# Patient Record
Sex: Female | Born: 1970 | Race: Asian | Hispanic: No | Marital: Married | Smoking: Never smoker
Health system: Southern US, Community
[De-identification: ages and names within clinical notes are randomized; demographics above are authoritative.]

## PROBLEM LIST (undated history)

## (undated) ENCOUNTER — Emergency Department (HOSPITAL_COMMUNITY): Admission: EM | Source: Home / Self Care

## (undated) DIAGNOSIS — R7303 Prediabetes: Secondary | ICD-10-CM

## (undated) DIAGNOSIS — D249 Benign neoplasm of unspecified breast: Secondary | ICD-10-CM

## (undated) DIAGNOSIS — F419 Anxiety disorder, unspecified: Secondary | ICD-10-CM

## (undated) DIAGNOSIS — K219 Gastro-esophageal reflux disease without esophagitis: Secondary | ICD-10-CM

## (undated) DIAGNOSIS — K8020 Calculus of gallbladder without cholecystitis without obstruction: Secondary | ICD-10-CM

## (undated) DIAGNOSIS — M1990 Unspecified osteoarthritis, unspecified site: Secondary | ICD-10-CM

## (undated) DIAGNOSIS — T7840XA Allergy, unspecified, initial encounter: Secondary | ICD-10-CM

## (undated) HISTORY — PX: COLONOSCOPY: SHX174

## (undated) HISTORY — DX: Unspecified osteoarthritis, unspecified site: M19.90

## (undated) HISTORY — DX: Gastro-esophageal reflux disease without esophagitis: K21.9

## (undated) HISTORY — DX: Anxiety disorder, unspecified: F41.9

## (undated) HISTORY — DX: Prediabetes: R73.03

## (undated) HISTORY — DX: Benign neoplasm of unspecified breast: D24.9

## (undated) HISTORY — DX: Allergy, unspecified, initial encounter: T78.40XA

---

## 2012-10-09 ENCOUNTER — Ambulatory Visit (INDEPENDENT_AMBULATORY_CARE_PROVIDER_SITE_OTHER): Payer: BC Managed Care – PPO | Admitting: Family Medicine

## 2012-10-09 VITALS — BP 118/68 | HR 71 | Temp 98.1°F | Resp 16 | Ht 62.25 in | Wt 125.0 lb

## 2012-10-09 DIAGNOSIS — N6311 Unspecified lump in the right breast, upper outer quadrant: Secondary | ICD-10-CM

## 2012-10-09 DIAGNOSIS — N63 Unspecified lump in unspecified breast: Secondary | ICD-10-CM

## 2012-10-09 DIAGNOSIS — Z0000 Encounter for general adult medical examination without abnormal findings: Secondary | ICD-10-CM

## 2012-10-09 NOTE — Progress Notes (Signed)
Subjective:    Patient ID: Cristina Boyd, female    DOB: 19-Mar-1970, 42 y.o.   MRN: [id number]  HPI Nature Cristina Boyd is a 42 y.o. female New pt- here for CPE and concern as below. Here with dtr- translator.  No current primary care provider.  Last pap WNL 10/10. Tetanus: 8 years ago Dentist: none - plans to schedule.  Optho: none - plans to schedule.  Cup of milk only this am.  Had bloodwork done at outside office - Dr. Mayford Boyd on [address] - ? Check up? Cholesterol and other tests.  Lump/sore area in R breast above prior sore lump -past 2-3 years.Soreness in area around time of period - about 4 days. No prior eval. Did have lump in R lower/outside breast about 4 years ago - had MMG, ultrasound, and biopsy - told was ok.  Recommended MMG at 43 yo. No unexplained wt loss, no night sweats, no nipple discharge. LMP July 25th - normal.    History reviewed. No pertinent past medical history. History reviewed. No pertinent past surgical history. Only surgery R breast biopsy - 4 years ago.   No Known Allergies Prior to Admission medications   Not on File   History   Social History  . Marital Status: Married    Spouse Name: N/A    Number of Children: N/A  . Years of Education: N/A   Occupational History  . Not on file.   Social History Main Topics  . Smoking status: Never Smoker   . Smokeless tobacco: Not on file  . Alcohol Use: No  . Drug Use: No  . Sexually Active: Yes   Other Topics Concern  . Not on file   Social History Narrative  . No narrative on file    FH: mom with HTN. No cancer by hx.    Review of Systems 13 point review of systems per patient health survey noted.  Negative other than as indicated on HPI.     Objective:   Physical Exam  Vitals reviewed. Constitutional: She is oriented to person, place, and time. She appears well-developed and well-nourished.  HENT:  Head: Normocephalic and atraumatic.  Right Ear: External ear normal.  Left Ear: External  ear normal.  Mouth/Throat: Oropharynx is clear and moist.  Eyes: Conjunctivae are normal. Pupils are equal, round, and reactive to light.  Neck: Normal range of motion. Neck supple. No thyromegaly present.  Cardiovascular: Normal rate, regular rhythm, normal heart sounds and intact distal pulses.   No murmur heard. Pulmonary/Chest: Effort normal and breath sounds normal. No respiratory distress. She has no wheezes. Right breast exhibits mass (small, tender, mobile thickened area of tissue approx 3-4cm above nipple. no nipple d/c, no retraction.  no axillary or supraclavicular LAD. ) and tenderness. Right breast exhibits no inverted nipple, no nipple discharge and no skin change. Left breast exhibits no inverted nipple, no mass, no nipple discharge, no skin change and no tenderness. Breasts are symmetrical.    Abdominal: Soft. Bowel sounds are normal. There is no tenderness.  Genitourinary: Pelvic exam was performed with patient supine. There is no lesion on the right labia. There is no lesion on the left labia. There is bleeding (minmal at os after pap testing.  strings of IUD visualized. ) around the vagina. No vaginal discharge found.  Musculoskeletal: Normal range of motion. She exhibits no edema and no tenderness.  Lymphadenopathy:    She has no cervical adenopathy.       Right: No  inguinal adenopathy present.       Left: No inguinal adenopathy present.  Neurological: She is alert and oriented to person, place, and time.  Skin: Skin is warm and dry. No rash noted.  Psychiatric: She has a normal mood and affect. Her behavior is normal. Thought content normal.   Filed Vitals:   10/09/12 0834  BP: 118/68  Pulse: 71  Temp: 98.1 F (36.7 C)  Resp: 16   Vision reviewed - WNL.        Assessment & Plan:  [name] is a 42 y.o. female Annual physical exam - Plan: Pap IG w/ reflex to HPV when ASC-U  Breast lump on right side at 11 o'clock position - Plan: MM Digital Diagnostic  Bilat   CPE/annual exam- anticipatory guidance given. Will obtain bloodwork form outside office, then determine if repeat or other testing needed.  Refer for MMG - diagnostic and ultrasound if needed.  Pap 2.  Declined STI testing.  Anticipatory guidance.   R breast lump - prior negative workup.  Will recheck diagnostic mammogram +/- ultrasound.   Patient Instructions  We will schedule mammogram.  You should receive a call or letter about your lab results within the next week to 10 days.  We requested bloodwork from outside office and can determine if other testing needed after that is received.  Return to the clinic or go to the nearest emergency room if any of your symptoms worsen or new symptoms occur. Keeping You Healthy  Get These Tests 1. Blood Pressure- Have your blood pressure checked once a year by your health care provider.  Normal blood pressure is 120/80. 2. Weight- Have your body mass index (BMI) calculated to screen for obesity.  BMI is measure of body fat based on height and weight.  You can also calculate your own BMI at https://www.west-esparza.com/. 3. Cholesterol- Have your cholesterol checked every 5 years starting at age 38 then yearly starting at age 66. 4. Chlamydia, HIV, and other sexually transmitted diseases- Get screened every year until age 60, then within three months of each new sexual provider. 5. Pap Smear- Every 1-3 years; discuss with your health care provider. 6. Mammogram- Every year starting at age 87  Take these medicines  Calcium with Vitamin D-Your body needs 1200 mg of Calcium each day and [phone] IU of Vitamin D daily.  Your body can only absorb 500 mg of Calcium at a time so Calcium must be taken in 2 or 3 divided doses throughout the day.  Multivitamin with folic acid- Once daily if it is possible for you to become pregnant.  Get these Immunizations  Gardasil-Series of three doses; prevents HPV related illness such as genital warts and  cervical cancer.  Menactra-Single dose; prevents meningitis.  Tetanus shot- Every 10 years.  Flu shot-Every year.  Take these steps 1. Do not smoke-Your healthcare provider can help you quit.  For tips on how to quit go to www.smokefree.gov or call 1-800 QUITNOW. 2. Be physically active- Exercise 5 days a week for at least 30 minutes.  If you are not already physically active, start slow and gradually work up to 30 minutes of moderate physical activity.  Examples of moderate activity include walking briskly, dancing, swimming, bicycling, etc. 3. Breast Cancer- A self breast exam every month is important for early detection of breast cancer.  For more information and instruction on self breast exams, ask your healthcare provider or [email or web address]. 4. Eat a healthy diet- Eat a variety  of healthy foods such as fruits, vegetables, whole grains, low fat milk, low fat cheeses, yogurt, lean meats, poultry and fish, beans, nuts, tofu, etc.  For more information go to www. Thenutritionsource.org 5. Drink alcohol in moderation- Limit alcohol intake to one drink or less per day. Never drink and drive. 6. Depression- Your emotional health is as important as your physical health.  If you're feeling down or losing interest in things you normally enjoy please talk to your healthcare provider about being screened for depression. 7. Dental visit- Brush and floss your teeth twice daily; visit your dentist twice a year. 8. Eye doctor- Get an eye exam at least every 2 years. 9. Helmet use- Always wear a helmet when riding a bicycle, motorcycle, rollerblading or skateboarding. 10. Safe sex- If you may be exposed to sexually transmitted infections, use a condom. 11. Seat belts- Seat belts can save your live; always wear one. 12. Smoke/Carbon Monoxide detectors- These detectors need to be installed on the appropriate level of your home. Replace batteries at least once a year. 13. Skin  cancer- When out in the sun please cover up and use sunscreen 15 SPF or higher. 14. Violence- If anyone is threatening or hurting you, please tell your healthcare provider.

## 2012-10-09 NOTE — Patient Instructions (Signed)
We will schedule mammogram.  You should receive a call or letter about your lab results within the next week to 10 days.  We requested bloodwork from outside office and can determine if other testing needed after that is received.  Return to the clinic or go to the nearest emergency room if any of your symptoms worsen or new symptoms occur. Keeping You Healthy  Get These Tests 1. Blood Pressure- Have your blood pressure checked once a year by your health care provider.  Normal blood pressure is 120/80. 2. Weight- Have your body mass index (BMI) calculated to screen for obesity.  BMI is measure of body fat based on height and weight.  You can also calculate your own BMI at https://www.west-esparza.com/. 3. Cholesterol- Have your cholesterol checked every 5 years starting at age 47 then yearly starting at age 63. 4. Chlamydia, HIV, and other sexually transmitted diseases- Get screened every year until age 67, then within three months of each new sexual provider. 5. Pap Smear- Every 1-3 years; discuss with your health care provider. 6. Mammogram- Every year starting at age 53  Take these medicines  Calcium with Vitamin D-Your body needs 1200 mg of Calcium each day and [phone] IU of Vitamin D daily.  Your body can only absorb 500 mg of Calcium at a time so Calcium must be taken in 2 or 3 divided doses throughout the day.  Multivitamin with folic acid- Once daily if it is possible for you to become pregnant.  Get these Immunizations  Gardasil-Series of three doses; prevents HPV related illness such as genital warts and cervical cancer.  Menactra-Single dose; prevents meningitis.  Tetanus shot- Every 10 years.  Flu shot-Every year.  Take these steps 1. Do not smoke-Your healthcare provider can help you quit.  For tips on how to quit go to www.smokefree.gov or call 1-800 QUITNOW. 2. Be physically active- Exercise 5 days a week for at least 30 minutes.  If you are not already physically active,  start slow and gradually work up to 30 minutes of moderate physical activity.  Examples of moderate activity include walking briskly, dancing, swimming, bicycling, etc. 3. Breast Cancer- A self breast exam every month is important for early detection of breast cancer.  For more information and instruction on self breast exams, ask your healthcare provider or [email or web address]. 4. Eat a healthy diet- Eat a variety of healthy foods such as fruits, vegetables, whole grains, low fat milk, low fat cheeses, yogurt, lean meats, poultry and fish, beans, nuts, tofu, etc.  For more information go to www. Thenutritionsource.org 5. Drink alcohol in moderation- Limit alcohol intake to one drink or less per day. Never drink and drive. 6. Depression- Your emotional health is as important as your physical health.  If you're feeling down or losing interest in things you normally enjoy please talk to your healthcare provider about being screened for depression. 7. Dental visit- Brush and floss your teeth twice daily; visit your dentist twice a year. 8. Eye doctor- Get an eye exam at least every 2 years. 9. Helmet use- Always wear a helmet when riding a bicycle, motorcycle, rollerblading or skateboarding. 10. Safe sex- If you may be exposed to sexually transmitted infections, use a condom. 11. Seat belts- Seat belts can save your live; always wear one. 12. Smoke/Carbon Monoxide detectors- These detectors need to be installed on the appropriate level of your home. Replace batteries at least once a year. 13. Skin cancer- When out in the sun please  cover up and use sunscreen 15 SPF or higher. 14. Violence- If anyone is threatening or hurting you, please tell your healthcare provider.

## 2012-10-10 ENCOUNTER — Other Ambulatory Visit: Payer: Self-pay | Admitting: Family Medicine

## 2012-10-10 DIAGNOSIS — N6311 Unspecified lump in the right breast, upper outer quadrant: Secondary | ICD-10-CM

## 2012-10-10 LAB — PAP IG W/ RFLX HPV ASCU

## 2012-11-12 ENCOUNTER — Ambulatory Visit (INDEPENDENT_AMBULATORY_CARE_PROVIDER_SITE_OTHER): Payer: BC Managed Care – PPO | Admitting: Family Medicine

## 2012-11-12 VITALS — BP 98/62 | HR 71 | Temp 98.0°F | Resp 18 | Ht 62.0 in | Wt 126.0 lb

## 2012-11-12 DIAGNOSIS — IMO0001 Reserved for inherently not codable concepts without codable children: Secondary | ICD-10-CM

## 2012-11-12 DIAGNOSIS — R5381 Other malaise: Secondary | ICD-10-CM

## 2012-11-12 DIAGNOSIS — R5383 Other fatigue: Secondary | ICD-10-CM

## 2012-11-12 DIAGNOSIS — D249 Benign neoplasm of unspecified breast: Secondary | ICD-10-CM

## 2012-11-12 DIAGNOSIS — M791 Myalgia, unspecified site: Secondary | ICD-10-CM

## 2012-11-12 DIAGNOSIS — D179 Benign lipomatous neoplasm, unspecified: Secondary | ICD-10-CM

## 2012-11-12 LAB — COMPREHENSIVE METABOLIC PANEL WITH GFR
ALT: 20 U/L (ref 0–35)
AST: 18 U/L (ref 0–37)
Albumin: 4.2 g/dL (ref 3.5–5.2)
Alkaline Phosphatase: 48 U/L (ref 39–117)
Glucose, Bld: 118 mg/dL — ABNORMAL HIGH (ref 70–99)
Potassium: 3.8 meq/L (ref 3.5–5.3)
Sodium: 137 meq/L (ref 135–145)
Total Protein: 7.5 g/dL (ref 6.0–8.3)

## 2012-11-12 LAB — POCT CBC
Granulocyte percent: 65.2 % (ref 37–80)
HCT, POC: 39.9 % (ref 37.7–47.9)
Hemoglobin: 12.2 g/dL (ref 12.2–16.2)
Lymph, poc: 1.5 (ref 0.6–3.4)
MCH, POC: 27.5 pg (ref 27–31.2)
MCHC: 30.6 g/dL — AB (ref 31.8–35.4)
MCV: 90.1 fL (ref 80–97)
MID (cbc): 0.3 (ref 0–0.9)
MPV: 9 fL (ref 0–99.8)
POC Granulocyte: 3.4 (ref 2–6.9)
POC LYMPH PERCENT: 29.1 %L (ref 10–50)
POC MID %: 5.7 % (ref 0–12)
Platelet Count, POC: 298 10*3/uL (ref 142–424)
RBC: 4.43 M/uL (ref 4.04–5.48)
RDW, POC: 13.5 %
WBC: 5.2 10*3/uL (ref 4.6–10.2)

## 2012-11-12 LAB — POCT INFLUENZA A/B
Influenza A, POC: NEGATIVE
Influenza B, POC: NEGATIVE

## 2012-11-12 LAB — COMPREHENSIVE METABOLIC PANEL
BUN: 8 mg/dL (ref 6–23)
CO2: 25 mEq/L (ref 19–32)
Calcium: 9.5 mg/dL (ref 8.4–10.5)
Chloride: 104 mEq/L (ref 96–112)
Creat: 0.54 mg/dL (ref 0.50–1.10)
Total Bilirubin: 0.4 mg/dL (ref 0.3–1.2)

## 2012-11-12 LAB — TSH: TSH: 1.672 u[IU]/mL (ref 0.350–4.500)

## 2012-11-12 NOTE — Patient Instructions (Signed)
Fibroadenoma  A fibroadenoma is a small, round, rubbery lump (tumor). The lump is not cancer. It often does not cause pain. It may move slightly when you touch it. This kind of lump can grow in one or both breasts.  HOME CARE  · Check your lump often for any changes.  · Keep all follow-up exams and mammograms.  GET HELP RIGHT AWAY IF:  · The lump changes in size.  · The lump becomes tender and painful.  · You have fluid coming from your nipple.  MAKE SURE YOU:  · Understand these instructions.  · Will watch your condition.  · Will get help right away if you are not doing well or get worse.  Document Released: 05/19/2008 Document Revised: 05/15/2011 Document Reviewed: 05/19/2008  ExitCare® Patient Information ©2014 [employer].

## 2012-11-12 NOTE — Progress Notes (Signed)
[facility]:  Office Visit  Chief Complaint:  Chief Complaint  Patient presents with  . discuss mammogram  . Flu Vaccine  . large places on both legs    x1 year    HPI: Cristina Boyd is a 42 y.o. female who complains of  1 day history of flu like sxs, fatigue, msk aches, took advil felt slightly better. She also has had fibroadenoma on mammogram, she would like to talk about the mammogram results and see how she can ake this better. Mammogram results reviewed with her. She also has had bumps on her bilateral thighs for the last 12 months and they have not changed in size or color. She denies pain.     History reviewed. No pertinent past medical history. History reviewed. No pertinent past surgical history. History   Social History  . Marital Status: Married    Spouse Name: N/A    Number of Children: N/A  . Years of Education: N/A   Social History Main Topics  . Smoking status: Never Smoker   . Smokeless tobacco: None  . Alcohol Use: No  . Drug Use: No  . Sexual Activity: Yes   Other Topics Concern  . None   Social History Narrative  . None   Family History  Problem Relation Age of Onset  . Hypertension Mother    No Known Allergies Prior to Admission medications   Medication Sig Start Date End Date Taking? Authorizing Provider  ibuprofen (ADVIL,MOTRIN) 200 MG tablet Take 200 mg by mouth every 6 (six) hours as needed for pain.   Yes Historical Provider, MD     ROS: The patient denies  night sweats, unintentional weight loss, chest pain, palpitations, wheezing, dyspnea on exertion, nausea, vomiting, abdominal pain, dysuria, hematuria, melena, numbness, , or tingling.   All other systems have been reviewed and were otherwise negative with the exception of those mentioned in the HPI and as above.    PHYSICAL EXAM: Filed Vitals:   11/12/12 0815  BP: 98/62  Pulse: 71  Temp: 98 F (36.7 C)  Resp: 18   Filed Vitals:   11/12/12 0815  Height: 5'  2" (1.575 m)  Weight: 126 lb (57.153 kg)   Body mass index is 23.04 kg/(m^2).  General: Alert, no acute distress HEENT:  Normocephalic, atraumatic, oropharynx patent. EOMI, PERRLA Cardiovascular:  Regular rate and rhythm, no rubs murmurs or gallops.  No Carotid bruits, radial pulse intact. No pedal edema.  Respiratory: Clear to auscultation bilaterally.  No wheezes, rales, or rhonchi.  No cyanosis, no use of accessory musculature GI: No organomegaly, abdomen is soft and non-tender, positive bowel sounds.  No masses. Skin: No rashes. + lipomas on bilateral thigh Neurologic: Facial musculature symmetric. Psychiatric: Patient is appropriate throughout our interaction. Lymphatic: No cervical lymphadenopathy Musculoskeletal: Gait intact. Breast-+ fibroadenoma, no dc, no nipple changes bilaterally No LAD, no thyroidmegaly   LABS: Results for orders placed in visit on 11/12/12  POCT CBC      Result Value Range   WBC 5.2  4.6 - 10.2 K/uL   Lymph, poc 1.5  0.6 - 3.4   POC LYMPH PERCENT 29.1  10 - 50 %L   MID (cbc) 0.3  0 - 0.9   POC MID % 5.7  0 - 12 %M   POC Granulocyte 3.4  2 - 6.9   Granulocyte percent 65.2  37 - 80 %G   RBC 4.43  4.04 - 5.48 M/uL   Hemoglobin 12.2  12.2 - 16.2 g/dL   HCT, POC 09.8  11.9 - 47.9 %   MCV 90.1  80 - 97 fL   MCH, POC 27.5  27 - 31.2 pg   MCHC 30.6 (*) 31.8 - 35.4 g/dL   RDW, POC 14.7     Platelet Count, POC 298  142 - 424 K/uL   MPV 9.0  0 - 99.8 fL  POCT INFLUENZA A/B      Result Value Range   Influenza A, POC Negative     Influenza B, POC Negative       EKG/XRAY:   Primary read interpreted by Dr. Conley Rolls at [facility].   ASSESSMENT/PLAN: Encounter Diagnoses  Name Primary?  . Fatigue Yes  . Muscle ache   . Lipoma   . Breast fibroadenoma, unspecified laterality    1. Monitor Lipoma 2. Return in October flu vaccine 3. Annual mammograms and SBE 4. Labs pending F/u prn Gross sideeffects, risk and benefits, and alternatives of medications d/w  patient. Patient is aware that all medications have potential sideeffects and we are unable to predict every sideeffect or drug-drug interaction that may occur.  LE, THAO PHUONG, DO 11/12/2012 9:27 AM

## 2012-11-14 ENCOUNTER — Encounter: Payer: Self-pay | Admitting: Family Medicine

## 2012-11-14 ENCOUNTER — Telehealth: Payer: Self-pay

## 2012-11-14 NOTE — Telephone Encounter (Signed)
Patient would like dr Conley Rolls to call regarding having chills and fever [phone]

## 2012-11-14 NOTE — Telephone Encounter (Signed)
Called patient. She states she is taking tylenol and feels better with the tylenol, indicates her neck still painful wants to know if there is anything else you recommend. She states she will come back in if needed, please advise. She is encouraged to use tylenol or advil for the fever.

## 2012-11-15 ENCOUNTER — Other Ambulatory Visit: Payer: Self-pay | Admitting: Family Medicine

## 2012-11-15 MED ORDER — CYCLOBENZAPRINE HCL 5 MG PO TABS: 5.0000 mg | ORAL_TABLET | Freq: Every evening | ORAL | Status: DC | PRN

## 2012-11-20 ENCOUNTER — Encounter: Payer: Self-pay | Admitting: Radiology

## 2012-11-20 DIAGNOSIS — R5381 Other malaise: Secondary | ICD-10-CM

## 2012-11-22 ENCOUNTER — Telehealth: Payer: Self-pay

## 2012-11-22 NOTE — Telephone Encounter (Signed)
Called to advise, left message.

## 2012-11-22 NOTE — Telephone Encounter (Signed)
Please advise,complains of continued fatigue.

## 2012-11-22 NOTE — Telephone Encounter (Signed)
Pt daughther is calling because her mother came in last month and had some blood work done and was told that she had high glucose levels. Now her mother is feeling tired and is hurting they are wondering if their is some type of medication that the mother could be put on to help with all of this  Call back number is [phone]

## 2012-11-26 ENCOUNTER — Ambulatory Visit (INDEPENDENT_AMBULATORY_CARE_PROVIDER_SITE_OTHER): Payer: BC Managed Care – PPO | Admitting: Family Medicine

## 2012-11-26 VITALS — BP 114/62 | HR 76 | Temp 98.1°F | Resp 18 | Ht 63.0 in | Wt 124.0 lb

## 2012-11-26 DIAGNOSIS — R202 Paresthesia of skin: Secondary | ICD-10-CM

## 2012-11-26 DIAGNOSIS — D551 Anemia due to other disorders of glutathione metabolism: Secondary | ICD-10-CM

## 2012-11-26 DIAGNOSIS — R631 Polydipsia: Secondary | ICD-10-CM

## 2012-11-26 DIAGNOSIS — Z13220 Encounter for screening for lipoid disorders: Secondary | ICD-10-CM

## 2012-11-26 DIAGNOSIS — R209 Unspecified disturbances of skin sensation: Secondary | ICD-10-CM

## 2012-11-26 DIAGNOSIS — N390 Urinary tract infection, site not specified: Secondary | ICD-10-CM

## 2012-11-26 DIAGNOSIS — R5381 Other malaise: Secondary | ICD-10-CM

## 2012-11-26 DIAGNOSIS — R229 Localized swelling, mass and lump, unspecified: Secondary | ICD-10-CM

## 2012-11-26 LAB — LIPID PANEL
Cholesterol: 208 mg/dL — ABNORMAL HIGH (ref 0–200)
HDL: 63 mg/dL (ref >39)
LDL Cholesterol: 132 mg/dL — ABNORMAL HIGH (ref 0–99)
Total CHOL/HDL Ratio: 3.3 Ratio
Triglycerides: 67 mg/dL (ref <150)
VLDL: 13 mg/dL (ref 0–40)

## 2012-11-26 LAB — POCT UA - MICROSCOPIC ONLY
Bacteria, U Microscopic: NEGATIVE
Casts, Ur, LPF, POC: NEGATIVE
Crystals, Ur, HPF, POC: NEGATIVE
Mucus, UA: POSITIVE
Yeast, UA: NEGATIVE

## 2012-11-26 LAB — POCT URINALYSIS DIPSTICK
Bilirubin, UA: NEGATIVE
Blood, UA: NEGATIVE
Glucose, UA: NEGATIVE
Nitrite, UA: NEGATIVE
Protein, UA: NEGATIVE
Spec Grav, UA: 1.02
Urobilinogen, UA: 0.2
pH, UA: 7

## 2012-11-26 LAB — POCT GLYCOSYLATED HEMOGLOBIN (HGB A1C): Hemoglobin A1C: 5.5

## 2012-11-26 LAB — POCT CBC
Granulocyte percent: 83.3 % — AB (ref 37–80)
HCT, POC: 41.4 % (ref 37.7–47.9)
Hemoglobin: 12.4 g/dL (ref 12.2–16.2)
Lymph, poc: 0.9 (ref 0.6–3.4)
MCH, POC: 27.6 pg (ref 27–31.2)
MCHC: 30 g/dL — AB (ref 31.8–35.4)
MCV: 92 fL (ref 80–97)
MID (cbc): 0.2 (ref 0–0.9)
MPV: 8.9 fL (ref 0–99.8)
POC Granulocyte: 5.6 (ref 2–6.9)
POC LYMPH PERCENT: 13.6 % (ref 10–50)
POC MID %: 3.1 % (ref 0–12)
Platelet Count, POC: 328 10*3/uL (ref 142–424)
RBC: 4.5 M/uL (ref 4.04–5.48)
RDW, POC: 13.3 %
WBC: 6.7 10*3/uL (ref 4.6–10.2)

## 2012-11-26 LAB — POCT SEDIMENTATION RATE: POCT SED RATE: 29 mm/hr — AB (ref 0–22)

## 2012-11-26 MED ORDER — CIPROFLOXACIN HCL 500 MG PO TABS: 500.0000 mg | ORAL_TABLET | Freq: Two times a day (BID) | ORAL | Status: DC

## 2012-11-26 NOTE — Patient Instructions (Signed)
Nhi?m Trng ???ng Ti?t Ni?u  (Urinary Tract Infection)  Nhi?m trng ???ng ti?t ni?u (UTI) c th? pht tri?n ?? b?t c? vi? tri? na?o d?c ???ng ti?t ni?u. ???ng ti?t ni?u l h? th?ng thot n??c c?a c? th? ?? lo?i b? ch?t th?i v n??c d? th?a. ???ng ti?t ni?u bao g?m hai th?n, ni?u qu?n, bng quang v ni?u ??o. Th?n l m?t c?p c? quan hnh h?t ??u. M?i th?n co? kch th??c kho?ng b?ng bn tay c?a b?n. Chng n?m d??i x??ng s??n, m?i bn c?t s?ng c m?t qu?[facility] [place] [facility]  Nhi?m trng gy b?i vi sinh v?t, l cc sinh v?t nh?, bao g?m n?m, vi rt v vi khu?n. Nh?ng sinh v?t ny nh? t?i m?c chng ch? c th? ???c nhn th?y qua knh hi?n vi. Vi khu?n l nh?ng vi sinh v?t ph? bi?n nh?t gy ra UTI.  TRI?U CH?NG  Cc tri?u ch?ng c?a UTI c th? khc nhau, ty theo ?? tu?i v gi?i tnh c?a b?nh nhn v b?i v? tr nhi?m trng. Cc tri?u ch?ng ? ph? n? tr? th??ng bao g?m nhu c?u ti?u ti?n th??ng xuyn v d? d?i, c?m gic ?au v rt ? bng quang ho?c ni?u ??o khi ?i ti?u. Ph? n? v nam gi?i l?n tu?i c nhi?u kh? n?ng b? m?t m?i, run r?y v y?u, ??ng th?i b? ?au c? v ?au b?ng. S?t c th? c ngh?a l nhi?m trng ? th?n. Cc tri?u ch?ng khc c?a nhi?m trng th?n bao g?m ?au ? l?ng ho?c hai bn d??i x??ng s??n, bu?n nn v nn m?a.  CH?N ?ON  ?? ch?n ?on UTI, chuyn gia ch?m [place] y t? s? h?i b?n v? nh?ng tri?u ch?ng c?a b?n. Chuyn gia ch?m [place] y t? c?ng s? yu c?u cung c?p m?u n??c ti?u. M?u n??c ti?u s? ???c xt nghi?m xem c vi khu?n v cc t? bo mu tr?ng khng. Cc t? bo mu tr?ng ???c t?o b?i c? th? ?? gip ch?ng l?i nhi?m trng.  ?I?U TR?  Thng th??ng, UTI c th? ???c ?i?u tr? b?ng thu?c. B?i v h?u h?t nguyn nhn gy ra UTI l do nhi?m trng b?i vi khu?n, chng th??ng c th? ???c ?i?u tr? b?ng cch s? d?ng thu?c khng sinh. Vi?c l?a ch?n khng sinh v th?i gian ?i?u tr? ph? thu?c vo tri?u ch?ng v lo?i vi khu?n gy nhi?m trng.  H??NG D?N CH?M [place] T?I NH   N?u b?n ? ???c k khng sinh, hy s? d?ng chng  ?ng nh? chuyn gia ch?m [place] y t? h??ng d?n. S? d?ng h?t thu?c ngay c? khi b?n c?m th?y kh h?n sau khi ch? dng m?t ph?n thu?c.  U?ng ?? n??c v dung d?ch ?? n??c ti?u trong ho?c c mu vng nh?t.  Trnh caffeine, tr v ?? u?ng c ga. Chng c xu h??ng kch thch bng quang.  ?i ti?u th??ng xuyn. Trnh nh?n ti?u trong th?i gian di.  ?i ti?u tr??c v sau khi quan h? tnh d?c.  Sau khi ?i ??i ti?n, ph? n? c?n lm s?ch t? tr??c ra sau. Ch? s? d?ng gi?y v? sinh m?t l?n. HY THAM V?N V?I CHUYN GIA Y T? N?U:   B?n b? ?au l?ng.  B?n b? s?t.  Cc tri?u ch?ng c?a b?n khng b?t ??u ?? trong vng 3 ngy. HY NGAY L?P T?C THAM V?N V?I CHUYN GIA Y T? N?U:   B?n b? ?au l?ng ho?c ?au b?ng d??i nghim tr?ng.  B?n pht tri?n ?  n l?nh.  B?n b? bu?n nn ho?c nn m?a.  B?n b? rt ho?c kh ch?u ti?p t?c khi ?i ti?u. ??M B?O B?N:   Hi?u cc h??ng d?n ny.  S? theo di tnh tr?ng c?a mnh.  S? yu c?u tr? gip ngay l?p t?c n?u b?n c?m th?y khng kh?e ho?c tnh tr?ng tr? nn t?i h?n. Document Released: 02/20/2005 Document Revised: 08/22/2011 [facility] Patient Information 2014 [place], [place].

## 2012-11-26 NOTE — Progress Notes (Signed)
[facility]:  Office Visit  Chief Complaint:  Chief Complaint  Patient presents with  . fatigue, not able to sleep, fever x3 days    HPI: Cristina Boyd is a 42 y.o. female who complains of  Generalized fatigue x several weeks and leg numbness and tingling x 1 day.  Last night she had some numbness and tingling in right leg, she took 2 pills of advil but still  could not sleep. She feels like her msk are tired, she denies cramps in legs.  She felt chills, Has had subjective fevers, took temp and she did not have fever. She has had thirst. She fet numbness and tingling all over her body. She does not have any energy.  She denies diabetes but has never been testedt   History reviewed. No pertinent past medical history. History reviewed. No pertinent past surgical history. History   Social History  . Marital Status: Married    Spouse Name: N/A    Number of Children: N/A  . Years of Education: N/A   Social History Main Topics  . Smoking status: Never Smoker   . Smokeless tobacco: None  . Alcohol Use: No  . Drug Use: No  . Sexual Activity: Yes   Other Topics Concern  . None   Social History Narrative  . None   Family History  Problem Relation Age of Onset  . Hypertension Mother    No Known Allergies Prior to Admission medications   Medication Sig Start Date End Date Taking? Authorizing Provider  ibuprofen (ADVIL,MOTRIN) 200 MG tablet Take 200 mg by mouth every 6 (six) hours as needed for pain.   Yes Historical Provider, MD  cyclobenzaprine (FLEXERIL) 5 MG tablet Take 1 tablet (5 mg total) by mouth at bedtime as needed for muscle spasms. For muscle spasms. Can make you drowsy 11/15/12   Thao P Le, DO     ROS: The patient denies fevers, chills, night sweats, unintentional weight loss, chest pain, palpitations, wheezing, dyspnea on exertion, nausea, vomiting, abdominal pain, dysuria, hematuria, melena, + numbness, weakness, tingling.   All other systems have  been reviewed and were otherwise negative with the exception of those mentioned in the HPI and as above.    PHYSICAL EXAM: Filed Vitals:   11/26/12 0852  BP: 114/62  Pulse: 76  Temp: 98.1 F (36.7 C)  Resp: 18   Filed Vitals:   11/26/12 0852  Height: 5\' 3"  (1.6 m)  Weight: 124 lb (56.246 kg)   Body mass index is 21.97 kg/(m^2).  General: Alert, no acute distress HEENT:  Normocephalic, atraumatic, oropharynx patent. EOMI, PERRLA Cardiovascular:  Regular rate and rhythm, no rubs murmurs or gallops.  No Carotid bruits, radial pulse intact. No pedal edema.  Respiratory: Clear to auscultation bilaterally.  No wheezes, rales, or rhonchi.  No cyanosis, no use of accessory musculature GI: No organomegaly, abdomen is soft and non-tender, positive bowel sounds.  No masses. 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 Psychiatric: Patient is appropriate throughout our interaction. Lymphatic: No cervical lymphadenopathy, no inguinal LAD Musculoskeletal: Gait intact. o CVA tenderness 5/5 strength, 2/2 DTRs, Neg homans.    LABS: Results for orders placed in visit on 11/26/12  POCT UA - MICROSCOPIC ONLY      Result Value Range   WBC, Ur, HPF, POC 1-2     RBC, urine, microscopic 0-1     Bacteria, U Microscopic neg  Mucus, UA pos     Epithelial cells, urine per micros 2-3     Crystals, Ur, HPF, POC neg     Casts, Ur, LPF, POC neg     Yeast, UA neg    POCT URINALYSIS DIPSTICK      Result Value Range   Color, UA yellow     Clarity, UA clear     Glucose, UA Neg     Bilirubin, UA Neg     Ketones, UA Trace     Spec Grav, UA 1.020     Blood, UA Neg     pH, UA 7.0     Protein, UA Neg     Urobilinogen, UA 0.2     Nitrite, UA Neg     Leukocytes, UA Trace    POCT GLYCOSYLATED HEMOGLOBIN (HGB A1C)      Result Value Range   Hemoglobin A1C 5.5    POCT CBC      Result Value Range   WBC  6.7  4.6 - 10.2 K/uL   Lymph, poc 0.9  0.6 - 3.4   POC LYMPH PERCENT 13.6  10 - 50 %L   MID (cbc) 0.2  0 - 0.9   POC MID % 3.1  0 - 12 %M   POC Granulocyte 5.6  2 - 6.9   Granulocyte percent 83.3 (*) 37 - 80 %G   RBC 4.5  4.04 - 5.48 M/uL   Hemoglobin 12.4  12.2 - 16.2 g/dL   HCT, POC 11.9  14.7 - 47.9 %   MCV 92.0  80 - 97 fL   MCH, POC 27.6  27 - 31.2 pg   MCHC 30.0 (*) 31.8 - 35.4 g/dL   RDW, POC 82.9     Platelet Count, POC 328  142 - 424 K/uL   MPV 8.9  0 - 99.8 fL     EKG/XRAY:   Primary read interpreted by Dr. Conley Rolls at [facility].   ASSESSMENT/PLAN: Encounter Diagnoses  Name Primary?  . Other malaise and fatigue Yes  . Paresthesia of right leg   . Multiple skin nodules   . Polydipsia   . Screening for cholesterol level    She has UTI, she is also dry in terms of ketones in urine Will give Cipro 500 mg BID, advise to push fluids Urine cx pending Refer to dermatology  Since patient requested that she sees a specialist for this, she wants piece of mind, she is asking me if they will bx it, I told her I d not know what the specialist paln to do , if it is a lipoma as I suspect  then perhaps not Lipid panel pending F/u prn Gross sideeffects, risk and benefits, and alternatives of medications d/w patient. Patient is aware that all medications have potential sideeffects and we are unable to predict every sideeffect or drug-drug interaction that may occur.  LE, THAO PHUONG, DO 11/26/2012 11:10 AM

## 2012-11-28 LAB — URINE CULTURE
Colony Count: NO GROWTH
Organism ID, Bacteria: NO GROWTH

## 2012-11-29 ENCOUNTER — Telehealth: Payer: Self-pay | Admitting: Family Medicine

## 2012-11-29 DIAGNOSIS — B379 Candidiasis, unspecified: Secondary | ICD-10-CM

## 2012-11-29 MED ORDER — FLUCONAZOLE 150 MG PO TABS: 150.0000 mg | ORAL_TABLET | Freq: Once | ORAL | Status: DC

## 2012-11-29 NOTE — Telephone Encounter (Signed)
Spoke to patient about labs. She was feeling fine yesterday but  Cristina Boyd has had vaginal dc sounds like yeast from her cipro. Continue with cipro and will rx diflucan 150 mg x 1 and may repeat. She will return for fasting lipids in 6 months afte lifestyle modifications or she can do it in 1 year.

## 2012-12-07 ENCOUNTER — Ambulatory Visit (INDEPENDENT_AMBULATORY_CARE_PROVIDER_SITE_OTHER): Payer: BC Managed Care – PPO | Admitting: Internal Medicine

## 2012-12-07 VITALS — BP 92/62 | HR 73 | Temp 98.6°F | Resp 18 | Ht 63.25 in | Wt 126.8 lb

## 2012-12-07 DIAGNOSIS — N390 Urinary tract infection, site not specified: Secondary | ICD-10-CM

## 2012-12-07 DIAGNOSIS — IMO0001 Reserved for inherently not codable concepts without codable children: Secondary | ICD-10-CM

## 2012-12-07 DIAGNOSIS — G4700 Insomnia, unspecified: Secondary | ICD-10-CM

## 2012-12-07 DIAGNOSIS — R5381 Other malaise: Secondary | ICD-10-CM

## 2012-12-07 DIAGNOSIS — M791 Myalgia, unspecified site: Secondary | ICD-10-CM

## 2012-12-07 LAB — POCT URINALYSIS DIPSTICK
Bilirubin, UA: NEGATIVE
Blood, UA: NEGATIVE
Glucose, UA: NEGATIVE
Leukocytes, UA: NEGATIVE
Nitrite, UA: NEGATIVE
Protein, UA: NEGATIVE
Urobilinogen, UA: 0.2

## 2012-12-07 LAB — POCT UA - MICROSCOPIC ONLY
Casts, Ur, LPF, POC: NEGATIVE
Mucus, UA: NEGATIVE
Yeast, UA: NEGATIVE

## 2012-12-07 MED ORDER — CLONAZEPAM 0.5 MG PO TABS: 0.5000 mg | ORAL_TABLET | Freq: Every evening | ORAL | Status: DC | PRN

## 2012-12-07 NOTE — Progress Notes (Signed)
Subjective:    Patient ID: Cristina Boyd, female    DOB: [birth year], 42 y.o.   MRN: [id number]  HPIhere w/ daughter helping to communicate tho she has good command of language  42 YO female patient returns to the office with complaints of shoulder and leg pain. After she urinates her abd feels strained. She has been having chills and is unable to sleep. She feels like she wants to vomit after urinating and when she lies flat.   She complains of lack of energy in the morning. Was eval for fatigue aug 14 dr G all ok eval Dr Conley Rolls sept x 2 Worried because motherinlaw recent dx liver ca  She has finished the Cipro given to her last OV. She did find relief however, her urinary symptoms have returned. Note cult was negative  She has been having muscle pains in both legs and her arms regardless of position. She has tried Tylenol and it does relieve the pain for about two hours but returns.  She has not taken any other medication including OTC. Has 2 "lumps" on thighs that she worries may be Ca like her friend  Sleep-lies there worrying bout all these issues and can't fall asleep  Review of Systems  Constitutional: Positive for chills and fatigue. Negative for fever, diaphoresis, activity change and appetite change.  Gastrointestinal: Positive for nausea. Negative for diarrhea and constipation.  Genitourinary: Positive for dysuria and flank pain.  Musculoskeletal: Positive for myalgias.  Psychiatric/Behavioral: Positive for sleep disturbance.       Objective:   Physical Exam  Constitutional: She is oriented to person, place, and time. She appears well-developed and well-nourished.  HENT:  Mouth/Throat: Oropharynx is clear and moist. No oropharyngeal exudate.  Eyes: Conjunctivae and EOM are normal. Pupils are equal, round, and reactive to light.  Neck: Neck supple. No thyromegaly present.  Cardiovascular: Normal rate, regular rhythm, normal heart sounds and intact distal pulses.   No murmur  heard. Pulmonary/Chest: Breath sounds normal.  Abdominal: Soft. Bowel sounds are normal. She exhibits no mass.  No organomegaly  Musculoskeletal: Normal range of motion. She exhibits no edema and no tenderness.  Cannot elicit any of her muscle complaints in arms and legs/joints all have full rom She has 1 cm freely movable sub cut mass R ant thigh and smaller one on L lat thigh  Lymphadenopathy:    She has no cervical adenopathy.  Neurological: She is alert and oriented to person, place, and time. She has normal reflexes. No cranial nerve deficit.  Skin: No rash noted. No erythema.    Results for orders placed in visit on 12/07/12  POCT UA - MICROSCOPIC ONLY      Result Value Range   WBC, Ur, HPF, POC 0-1     RBC, urine, microscopic 0-1     Bacteria, U Microscopic trace     Mucus, UA neg     Epithelial cells, urine per micros 4-6     Crystals, Ur, HPF, POC neg     Casts, Ur, LPF, POC neg     Yeast, UA neg    POCT URINALYSIS DIPSTICK      Result Value Range   Color, UA yellow     Clarity, UA clear     Glucose, UA neg     Bilirubin, UA neg     Ketones, UA neg     Spec Grav, UA 1.010     Blood, UA neg     pH, UA 7.0  Protein, UA neg     Urobilinogen, UA 0.2     Nitrite, UA neg     Leukocytes, UA Negative           Assessment & Plan:  UTI (urinary tract infection) -if ever present has resolved Fatigue Muscle aches Insomnia due to anxiety re somatic symptoms Urinary sxt  All thiings point to worry Trial klonopin .5 hs 3-4 weeks ressured and explained all these sxt in context of anxiety and poor sleep

## 2013-06-18 ENCOUNTER — Ambulatory Visit (INDEPENDENT_AMBULATORY_CARE_PROVIDER_SITE_OTHER): Payer: No Typology Code available for payment source | Admitting: Family Medicine

## 2013-06-18 VITALS — BP 102/62 | HR 73 | Temp 98.2°F | Resp 16 | Ht 61.5 in | Wt 128.0 lb

## 2013-06-18 DIAGNOSIS — R5381 Other malaise: Secondary | ICD-10-CM

## 2013-06-18 DIAGNOSIS — R252 Cramp and spasm: Secondary | ICD-10-CM

## 2013-06-18 DIAGNOSIS — G4700 Insomnia, unspecified: Secondary | ICD-10-CM

## 2013-06-18 DIAGNOSIS — E785 Hyperlipidemia, unspecified: Secondary | ICD-10-CM

## 2013-06-18 DIAGNOSIS — R5383 Other fatigue: Secondary | ICD-10-CM

## 2013-06-18 LAB — COMPREHENSIVE METABOLIC PANEL
AST: 21 U/L (ref 0–37)
Albumin: 4.7 g/dL (ref 3.5–5.2)
Alkaline Phosphatase: 50 U/L (ref 39–117)
BUN: 8 mg/dL (ref 6–23)
Calcium: 9.9 mg/dL (ref 8.4–10.5)
Chloride: 100 mEq/L (ref 96–112)
Creat: 0.57 mg/dL (ref 0.50–1.10)
Glucose, Bld: 86 mg/dL (ref 70–99)
Potassium: 3.8 mEq/L (ref 3.5–5.3)
Total Bilirubin: 0.5 mg/dL (ref 0.2–1.2)
Total Protein: 8 g/dL (ref 6.0–8.3)

## 2013-06-18 LAB — POCT CBC
Granulocyte percent: 61.9 %G (ref 37–80)
HCT, POC: 40.9 % (ref 37.7–47.9)
Hemoglobin: 12.8 g/dL (ref 12.2–16.2)
Lymph, poc: 1.5 (ref 0.6–3.4)
MCH, POC: 28.8 pg (ref 27–31.2)
MCHC: 31.3 g/dL — AB (ref 31.8–35.4)
MCV: 92.2 fL (ref 80–97)
MID (cbc): 0.3 (ref 0–0.9)
MPV: 8.6 fL (ref 0–99.8)
POC Granulocyte: 2.9 (ref 2–6.9)
POC LYMPH PERCENT: 31.3 %L (ref 10–50)
POC MID %: 6.8 % (ref 0–12)
Platelet Count, POC: 356 10*3/uL (ref 142–424)
RBC: 4.44 M/uL (ref 4.04–5.48)
RDW, POC: 13.3 %
WBC: 4.7 10*3/uL (ref 4.6–10.2)

## 2013-06-18 LAB — LIPID PANEL
Cholesterol: 212 mg/dL — ABNORMAL HIGH (ref 0–200)
HDL: 62 mg/dL (ref >39)
LDL Cholesterol: 114 mg/dL — ABNORMAL HIGH (ref 0–99)
Total CHOL/HDL Ratio: 3.4 Ratio
Triglycerides: 178 mg/dL — ABNORMAL HIGH (ref <150)
VLDL: 36 mg/dL (ref 0–40)

## 2013-06-18 LAB — COMPREHENSIVE METABOLIC PANEL WITH GFR
ALT: 18 U/L (ref 0–35)
CO2: 26 meq/L (ref 19–32)
Sodium: 137 meq/L (ref 135–145)

## 2013-06-18 LAB — TSH: TSH: 1.543 u[IU]/mL (ref 0.350–4.500)

## 2013-06-18 LAB — VITAMIN B12: Vitamin B-12: 715 pg/mL (ref 211–911)

## 2013-06-18 MED ORDER — CLONAZEPAM 0.5 MG PO TABS: 0.5000 mg | ORAL_TABLET | Freq: Every evening | ORAL | Status: DC | PRN

## 2013-06-18 MED ORDER — DICLOFENAC SODIUM 1 % TD GEL: 2.0000 g | Freq: Three times a day (TID) | TRANSDERMAL | Status: DC | PRN

## 2013-06-18 NOTE — Progress Notes (Signed)
 Chief Complaint:  Chief Complaint  Patient presents with  . Annual Exam  . g Pain    right x 3 days    HPI: Cristina Boyd is a 43 y.o. female who is here for annual exam but she had an annual exam in 10/2012. 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  Right leg pain but mostly at the sites of her lipoma. She has gone to see Derm and they state she has lipomas which I had suspected but she wanted a referral for this.  She was then seen by Dr Laney Pastor and was put on Klonopin prn for her insomnia/worry, she states that they help when she uses them if she really cannot sleep. + fatigue still. She denies stress. She would like blood work to make sure everything is normal  G4 L4 Menarche 14, q 30 monthly , 5 days, heavy, she has clots.  LMP April 4 Last mammogram was in March 2015, normal results She had a normal pap in 2014 .  No past medical history on file. No past surgical history on file. History   Social History  . Marital Status: Married    Spouse Name: N/A    Number of Children: N/A  . Years of Education: N/A   Social History Main Topics  . Smoking status: Never Smoker   . Smokeless tobacco: None  . Alcohol Use: No  . Drug Use: No  . Sexual Activity: Yes   Other Topics Concern  . None   Social History Narrative  . None   Family History  Problem Relation Age of Onset  . Hypertension Mother    No Known Allergies Prior to Admission medications   Medication Sig Start Date End Date Taking? Authorizing Provider  ibuprofen (ADVIL,MOTRIN) 200 MG tablet Take 200 mg by mouth every 6 (six) hours as needed for pain.   Yes Historical Provider, MD  ciprofloxacin (CIPRO) 500 MG tablet Take 1 tablet (500 mg total) by mouth 2 (two) times daily. 11/26/12    P , DO    clonazePAM (KLONOPIN) 0.5 MG tablet Take 1 tablet (0.5 mg total) by mouth at bedtime as needed. 12/07/12   andrew Koyanagi, MD  fluconazole (DIFLUCAN) 150 MG tablet Take 1 tablet (150 mg total) by mouth once. May repeat in 4 days if no improvement 11/29/12    P , DO     ROS: The patient denies fevers, chills, night sweats, unintentional weight loss, chest pain, palpitations, wheezing, dyspnea on exertion, nausea, vomiting, abdominal pain, dysuria, hematuria, melena.   All other systems have been reviewed and were otherwise negative with the exception of those mentioned in the HPI and as above.    PHYSICAL EXAM: Filed Vitals:   06/18/13 1036  BP: 102/62  Pulse: 73  Temp: 98.2 F (36.8 C)  Resp: 16   Filed Vitals:   06/18/13 1036  Height: 5' 1.5" (1.562 m)  Weight: 128 lb (58.06 kg)   Body mass index is 23.8 kg/(m^2).  General: Alert, no acute distress HEENT:  Normocephalic, atraumatic, oropharynx patent. EOMI, PERRLA Cardiovascular:  Regular rate and rhythm, no rubs murmurs or gallops.  No Carotid bruits, radial pulse intact. No pedal edema.  Respiratory: Clear to auscultation bilaterally.  No wheezes,  rales, or rhonchi.  No cyanosis, no use of accessory musculature GI: No organomegaly, abdomen is soft and non-tender, positive bowel sounds.  No masses. Skin: + lipoma s on upper thigh and also smaller nodule on right Neurologic: Facial musculature symmetric. Psychiatric: Patient is appropriate throughout our interaction. Lymphatic: No cervical lymphadenopathy Musculoskeletal: Gait intact.   LABS: Results for orders placed in visit on 12/07/12  POCT UA - MICROSCOPIC ONLY      Result Value Ref Range   WBC, Ur, HPF, POC 0-1     RBC, urine, microscopic 0-1     Bacteria, U Microscopic trace     Mucus, UA neg     Epithelial cells, urine per micros 4-6     Crystals, Ur, HPF, POC neg     Casts, Ur, LPF, POC neg     Yeast, UA neg    POCT URINALYSIS DIPSTICK      Result  Value Ref Range   Color, UA yellow     Clarity, UA clear     Glucose, UA neg     Bilirubin, UA neg     Ketones, UA neg     Spec Grav, UA 1.010     Blood, UA neg     pH, UA 7.0     Protein, UA neg     Urobilinogen, UA 0.2     Nitrite, UA neg     Leukocytes, UA Negative       EKG/XRAY:   Primary read interpreted by Dr. Marin Comment at [facility].   ASSESSMENT/PLAN: Encounter Diagnoses  Name Primary?  . Other and unspecified hyperlipidemia Yes  . Other malaise and fatigue   . Leg cramps   . Insomnia    Refilled klonopin to use prn Trial of voltaren gel for leg cramps/pain at sight of lipoma Return in 5 months for annual Labs pending : CBC, CMP, TSH, lipid, vit B12, Vit D F/u prn or in August 2015 for annual visit  Gross sideeffects, risk and benefits, and alternatives of medications d/w patient. Patient is aware that all medications have potential sideeffects and we are unable to predict every sideeffect or drug-drug interaction that may occur.  Glenford Bayley, DO 06/18/2013 11:56 AM

## 2013-06-19 LAB — VITAMIN D 25 HYDROXY (VIT D DEFICIENCY, FRACTURES): Vit D, 25-Hydroxy: 17 ng/mL — ABNORMAL LOW (ref 30–89)

## 2013-06-27 ENCOUNTER — Other Ambulatory Visit: Payer: Self-pay | Admitting: Family Medicine

## 2013-06-27 ENCOUNTER — Encounter: Payer: Self-pay | Admitting: Family Medicine

## 2013-06-27 ENCOUNTER — Telehealth: Payer: Self-pay | Admitting: Family Medicine

## 2013-06-27 DIAGNOSIS — E559 Vitamin D deficiency, unspecified: Secondary | ICD-10-CM

## 2013-06-27 MED ORDER — VITAMIN D (ERGOCALCIFEROL) 1.25 MG (50000 UNIT) PO CAPS: 50000.0000 [IU] | ORAL_CAPSULE | ORAL | Status: DC

## 2013-06-27 NOTE — Telephone Encounter (Signed)
Lm that her blood work shows lipids are high. We may want to try fish oil unless she thinks she can;t control it with diet and exercise. Recheck in 4-6 months. Will send letter.

## 2013-12-26 ENCOUNTER — Telehealth: Payer: Self-pay | Admitting: Family Medicine

## 2013-12-26 NOTE — Telephone Encounter (Signed)
LM in [place] that she needs to schedule a f/u mammography,there was an abnormal breast evaluation on 05/12/13 at [facility]. She was to return but did not. Left number for our office and also for [facility].

## 2014-02-03 ENCOUNTER — Encounter: Payer: Self-pay | Admitting: Family Medicine

## 2014-02-16 ENCOUNTER — Ambulatory Visit (INDEPENDENT_AMBULATORY_CARE_PROVIDER_SITE_OTHER): Payer: No Typology Code available for payment source | Admitting: Family Medicine

## 2014-02-16 VITALS — BP 110/56 | HR 78 | Temp 98.1°F | Resp 16 | Ht 63.0 in | Wt 129.6 lb

## 2014-02-16 DIAGNOSIS — M25511 Pain in right shoulder: Secondary | ICD-10-CM

## 2014-02-16 DIAGNOSIS — R319 Hematuria, unspecified: Secondary | ICD-10-CM

## 2014-02-16 DIAGNOSIS — R300 Dysuria: Secondary | ICD-10-CM

## 2014-02-16 DIAGNOSIS — R1084 Generalized abdominal pain: Secondary | ICD-10-CM

## 2014-02-16 DIAGNOSIS — E785 Hyperlipidemia, unspecified: Secondary | ICD-10-CM

## 2014-02-16 DIAGNOSIS — R739 Hyperglycemia, unspecified: Secondary | ICD-10-CM

## 2014-02-16 LAB — POCT UA - MICROSCOPIC ONLY
Casts, Ur, LPF, POC: NEGATIVE
Crystals, Ur, HPF, POC: NEGATIVE
MUCUS UA: NEGATIVE
Yeast, UA: NEGATIVE

## 2014-02-16 LAB — COMPREHENSIVE METABOLIC PANEL
ALT: 15 U/L (ref 0–35)
AST: 16 U/L (ref 0–37)
Albumin: 4.5 g/dL (ref 3.5–5.2)
Alkaline Phosphatase: 54 U/L (ref 39–117)
BUN: 12 mg/dL (ref 6–23)
CHLORIDE: 103 meq/L (ref 96–112)
CO2: 25 mEq/L (ref 19–32)
CREATININE: 0.58 mg/dL (ref 0.50–1.10)
Calcium: 9.6 mg/dL (ref 8.4–10.5)
GLUCOSE: 94 mg/dL (ref 70–99)
Potassium: 3.8 mEq/L (ref 3.5–5.3)
Sodium: 136 mEq/L (ref 135–145)
Total Bilirubin: 0.5 mg/dL (ref 0.2–1.2)
Total Protein: 8 g/dL (ref 6.0–8.3)

## 2014-02-16 LAB — POCT URINALYSIS DIPSTICK
Bilirubin, UA: NEGATIVE
Glucose, UA: NEGATIVE
Ketones, UA: NEGATIVE
NITRITE UA: NEGATIVE
PH UA: 6
Protein, UA: 30
Spec Grav, UA: 1.015
Urobilinogen, UA: 0.2

## 2014-02-16 LAB — POCT CBC
Granulocyte percent: 63.8 %G (ref 37–80)
HEMATOCRIT: 39.5 % (ref 37.7–47.9)
HEMOGLOBIN: 12.5 g/dL (ref 12.2–16.2)
LYMPH, POC: 1.7 (ref 0.6–3.4)
MCH: 28.1 pg (ref 27–31.2)
MCHC: 31.6 g/dL — AB (ref 31.8–35.4)
MCV: 88.8 fL (ref 80–97)
MID (cbc): 0.3 (ref 0–0.9)
MPV: 7.4 fL (ref 0–99.8)
POC Granulocyte: 3.5 (ref 2–6.9)
POC LYMPH PERCENT: 30.9 %L (ref 10–50)
POC MID %: 5.3 %M (ref 0–12)
Platelet Count, POC: 310 10*3/uL (ref 142–424)
RBC: 4.44 M/uL (ref 4.04–5.48)
RDW, POC: 14.4 %
WBC: 5.5 10*3/uL (ref 4.6–10.2)

## 2014-02-16 LAB — LIPID PANEL
CHOL/HDL RATIO: 3.2 ratio
Cholesterol: 200 mg/dL (ref 0–200)
HDL: 62 mg/dL (ref >39)
LDL Cholesterol: 123 mg/dL — ABNORMAL HIGH (ref 0–99)
TRIGLYCERIDES: 75 mg/dL (ref <150)
VLDL: 15 mg/dL (ref 0–40)

## 2014-02-16 NOTE — Progress Notes (Addendum)
Subjective:  This chart was scribed for Merri Ray, MD by Dellis Filbert, ED Scribe at [facility].The patient was seen in exam room 08 and the patient's care was started at 10:55 AM.   Patient ID: Cristina Boyd, female    DOB: 03/28/1970, 43 y.o.   MRN: [id number] Chief Complaint  Patient presents with  . Blood work    glucose and lipid   HPI HPI Comments: Cristina Boyd is a 43 y.o. female who presents to [facility] for blood work.  Her last PE august 2014, seen by Dr. Marin Comment a few times since then with multiple concerns malaise and fatigue. She initially saw Dr. Marin Comment for a physical but was too early at that time. She has had a history of insomnia and treated by Dr. Sonia Baller with klonopin in October of last year.  Hyperlipidemia:  In April of 2015 Lab Results  Component Value Date   CHOL 212* 06/18/2013   HDL 62 06/18/2013   LDLCALC 114* 06/18/2013   TRIG 178* 06/18/2013   CHOLHDL 3.4 06/18/2013  She was advised to try fish oil, diet and exercise, then to recheck in 4-6 months. Pt has been taking the fish oil qd, weight has been stable since last visit 128-129. She has not been exercising too much about 10 min during a weekday. She has been eating a lot of fruits and vegetables. Pt has not eaten anything today.  Hyperglycemia:  Noted initially in September of 2014. A1C 5.5 in September 2014.  Abnormal mammogram:  Since had a normal mammogram in November of this year which was a follow up to the abnormal mammogram in August of 2014. Pt states she has thickened breasts.  Right shoulder pain: This is chronic pain the pain occasionally that radiates down to her right arm. She is a nail stylist. Pt denies any recent falls  Abdominal pain:  She has had the abdominal pain around her menstrual period. She is currently on her period and they have been normal. Her abdominal pain has been intermittent for the past two weeks ago and she has pain some today. Pt has seen some blood in her  urine and dysuria two weeks ago. She denies trouble urinating, nausea, vomiting, and fever.  Patient Active Problem List   Diagnosis Date Noted  . Other malaise and fatigue 11/20/2012   History reviewed. No pertinent past medical history. History reviewed. No pertinent past surgical history. No Known Allergies Prior to Admission medications   Medication Sig Start Date End Date Taking? Authorizing Provider  clonazePAM (KLONOPIN) 0.5 MG tablet Take 1 tablet (0.5 mg total) by mouth at bedtime as needed. Patient not taking: Reported on 02/16/2014 06/18/13   Thao P Le, DO  diclofenac sodium (VOLTAREN) 1 % GEL Apply 2 g topically 3 (three) times daily with meals as needed. Patient not taking: Reported on 02/16/2014 06/18/13   Thao P Le, DO  Vitamin D, Ergocalciferol, (DRISDOL) 50000 UNITS CAPS capsule Take 1 capsule (50,000 Units total) by mouth every 7 (seven) days. Patient not taking: Reported on 02/16/2014 06/27/13   Glenford Bayley, DO   History   Social History  . Marital Status: Married    Spouse Name: N/A    Number of Children: N/A  . Years of Education: N/A   Occupational History  . Not on file.   Social History Main Topics  . Smoking status: Never Smoker   . Smokeless tobacco: Not on file  . Alcohol Use: No  .  Drug Use: No  . Sexual Activity: Yes   Other Topics Concern  . Not on file   Social History Narrative   Review of Systems  Constitutional: Negative for fever.  Gastrointestinal: Positive for abdominal pain. Negative for nausea and vomiting.  Genitourinary: Positive for dysuria and hematuria. Negative for decreased urine volume and genital sores.       Objective:  BP 110/56 mmHg  Pulse 78  Temp(Src) 98.1 F (36.7 C) (Oral)  Resp 16  Ht 5\' 3"  (1.6 m)  Wt 129 lb 9.6 oz (58.786 kg)  BMI 22.96 kg/m2  SpO2 100%  LMP 02/16/2014  Physical Exam  Constitutional: She is oriented to person, place, and time. She appears well-developed and well-nourished.  HENT:  Head:  Normocephalic and atraumatic.  Eyes: Conjunctivae and EOM are normal. Pupils are equal, round, and reactive to light.  Neck: Carotid bruit is not present.  Cervical ROM intact With left lateral flexion and left rotation she experiences some pain.  Cardiovascular: Normal rate, regular rhythm, normal heart sounds and intact distal pulses.   Pulmonary/Chest: Effort normal and breath sounds normal.  Abdominal: Soft. She exhibits no pulsatile midline mass. There is no tenderness.  Slight soreness in the suprapubic area.  Musculoskeletal:  No [place], AC tenderness. No bony tenderness on the right shoulder but some muslce spasam of the right trapezius. Has RTC strength and full ROM.  Neurological: She is alert and oriented to person, place, and time.  Reflex Scores:      Tricep reflexes are 2+ on the right side and 2+ on the left side.      Bicep reflexes are 2+ on the right side and 2+ on the left side.      Brachioradialis reflexes are 2+ on the right side and 2+ on the left side. strength intact including grip strength bilaterally.   Skin: Skin is warm and dry.  Psychiatric: She has a normal mood and affect. Her behavior is normal.  Vitals reviewed.  Results for orders placed or performed in visit on 02/16/14  POCT CBC  Result Value Ref Range   WBC 5.5 4.6 - 10.2 K/uL   Lymph, poc 1.7 0.6 - 3.4   POC LYMPH PERCENT 30.9 10 - 50 %L   MID (cbc) 0.3 0 - 0.9   POC MID % 5.3 0 - 12 %M   POC Granulocyte 3.5 2 - 6.9   Granulocyte percent 63.8 37 - 80 %G   RBC 4.44 4.04 - 5.48 M/uL   Hemoglobin 12.5 12.2 - 16.2 g/dL   HCT, POC 39.5 37.7 - 47.9 %   MCV 88.8 80 - 97 fL   MCH, POC 28.1 27 - 31.2 pg   MCHC 31.6 (A) 31.8 - 35.4 g/dL   RDW, POC 14.4 %   Platelet Count, POC 310 142 - 424 K/uL   MPV 7.4 0 - 99.8 fL  POCT UA - Microscopic Only  Result Value Ref Range   WBC, Ur, HPF, POC 4-8    RBC, urine, microscopic 25-30    Bacteria, U Microscopic trace    Mucus, UA neg    Epithelial cells,  urine per micros 2-8    Crystals, Ur, HPF, POC neg    Casts, Ur, LPF, POC neg    Yeast, UA neg   POCT urinalysis dipstick  Result Value Ref Range   Color, UA red    Clarity, UA clear    Glucose, UA neg    Bilirubin, UA neg  Ketones, UA neg    Spec Grav, UA 1.015    Blood, UA large    pH, UA 6.0    Protein, UA 30    Urobilinogen, UA 0.2    Nitrite, UA neg    Leukocytes, UA moderate (2+)        Assessment & Plan:  Cristina Boyd is a 43 y.o. female Hyperlipidemia - Plan: Lipid panel pending.   Hyperglycemia - Plan: Comprehensive metabolic panel pending. Most recent levels ok.  Good diet by hx, but exercise discussed.   Generalized abdominal pain - Plan: POCT CBC, Comprehensive metabolic panel, Urine culture  -currently on menses. Denies UTI sx's at this time. RTC after menses if abdominal pain persists, and receommended repeat U/A off menses to r/o hematuria.   Dysuria - Plan: POCT UA - Microscopic Only, POCT urinalysis dipstick, Urine culture  -prior dysuria, asx now. cx pending.  Hematuria - Plan: POCT CBC, POCT UA - Microscopic Only, POCT urinalysis dipstick, Urine culture  -as above. On menses currently.  Right shoulder pain  -suspect trapezius spasm, neck positioning.  Neck care manual and HEP discussed. If persists, consider Cspine XR. RTC precautions given.    No orders of the defined types were placed in this encounter.   Patient Instructions  You should receive a call or letter about your lab results within the next week to 10 days.  Tylenol or motrin as needed for neck pain, but see the exercises and treatments in the Neck Care Manual. If this is not improving in the next few weeks - return for recheck with Dr. Marin Comment or myself.  If abdominal pain returns after your menses, return to discuss further. Can also repeat a urine test when not on your menstrual period as discussed as blood in urine today. We will check a urine culture to see if infection is present, but your  symptoms do not appear to be a urine infection today. Return to the clinic or go to the nearest emergency room if any of your symptoms worsen or new symptoms occur.  Follow up with Dr. Marin Comment in next 6 months for a physical.    I personally performed the services described in this documentation, which was scribed in my presence. The recorded information has been reviewed and considered, and addended by me as needed.

## 2014-02-16 NOTE — Patient Instructions (Addendum)
You should receive a call or letter about your lab results within the next week to 10 days.  Tylenol or motrin as needed for neck pain, but see the exercises and treatments in the Neck Care Manual. If this is not improving in the next few weeks - return for recheck with Dr. Marin Comment or myself.  If abdominal pain returns after your menses, return to discuss further. Can also repeat a urine test when not on your menstrual period as discussed as blood in urine today. We will check a urine culture to see if infection is present, but your symptoms do not appear to be a urine infection today. Return to the clinic or go to the nearest emergency room if any of your symptoms worsen or new symptoms occur.  Follow up with Dr. Marin Comment in next 6 months for a physical.

## 2014-02-18 LAB — URINE CULTURE: Colony Count: >=100000

## 2014-09-28 ENCOUNTER — Ambulatory Visit (INDEPENDENT_AMBULATORY_CARE_PROVIDER_SITE_OTHER): Payer: No Typology Code available for payment source | Admitting: Family Medicine

## 2014-09-28 ENCOUNTER — Ambulatory Visit (INDEPENDENT_AMBULATORY_CARE_PROVIDER_SITE_OTHER): Payer: No Typology Code available for payment source

## 2014-09-28 VITALS — BP 110/62 | HR 67 | Temp 97.3°F | Resp 18 | Ht 63.25 in | Wt 133.2 lb

## 2014-09-28 DIAGNOSIS — E559 Vitamin D deficiency, unspecified: Secondary | ICD-10-CM | POA: Diagnosis not present

## 2014-09-28 DIAGNOSIS — K5900 Constipation, unspecified: Secondary | ICD-10-CM

## 2014-09-28 DIAGNOSIS — J029 Acute pharyngitis, unspecified: Secondary | ICD-10-CM | POA: Diagnosis not present

## 2014-09-28 DIAGNOSIS — M1990 Unspecified osteoarthritis, unspecified site: Secondary | ICD-10-CM | POA: Diagnosis not present

## 2014-09-28 DIAGNOSIS — M5440 Lumbago with sciatica, unspecified side: Secondary | ICD-10-CM

## 2014-09-28 LAB — POCT URINALYSIS DIPSTICK
BILIRUBIN UA: NEGATIVE
Glucose, UA: NEGATIVE
KETONES UA: NEGATIVE
Nitrite, UA: NEGATIVE
Protein, UA: NEGATIVE
Spec Grav, UA: 1.01
Urobilinogen, UA: 0.2
pH, UA: 7

## 2014-09-28 LAB — POCT UA - MICROSCOPIC ONLY
CASTS, UR, LPF, POC: NEGATIVE
Crystals, Ur, HPF, POC: NEGATIVE
MUCUS UA: NEGATIVE
RBC, urine, microscopic: NEGATIVE
YEAST UA: NEGATIVE

## 2014-09-28 LAB — POCT CBC
Granulocyte percent: 58.2 %G (ref 37–80)
HCT, POC: 38.3 % (ref 37.7–47.9)
HEMOGLOBIN: 12.1 g/dL — AB (ref 12.2–16.2)
LYMPH, POC: 1.3 (ref 0.6–3.4)
MCH: 27.6 pg (ref 27–31.2)
MCHC: 31.8 g/dL (ref 31.8–35.4)
MCV: 86.9 fL (ref 80–97)
MID (CBC): 0.3 (ref 0–0.9)
MPV: 7.1 fL (ref 0–99.8)
PLATELET COUNT, POC: 288 10*3/uL (ref 142–424)
POC GRANULOCYTE: 2.3 (ref 2–6.9)
POC LYMPH PERCENT: 33.5 %L (ref 10–50)
POC MID %: 8.3 %M (ref 0–12)
RBC: 4.4 M/uL (ref 4.04–5.48)
RDW, POC: 13.9 %
WBC: 3.9 10*3/uL — AB (ref 4.6–10.2)

## 2014-09-28 LAB — POCT URINE PREGNANCY: Preg Test, Ur: NEGATIVE

## 2014-09-28 LAB — COMPREHENSIVE METABOLIC PANEL
ALBUMIN: 4.5 g/dL (ref 3.6–5.1)
ALT: 21 U/L (ref 6–29)
AST: 19 U/L (ref 10–30)
Alkaline Phosphatase: 49 U/L (ref 33–115)
BUN: 14 mg/dL (ref 7–25)
CO2: 27 mEq/L (ref 20–31)
Calcium: 9.4 mg/dL (ref 8.6–10.2)
Chloride: 103 mEq/L (ref 98–110)
Creat: 0.58 mg/dL (ref 0.50–1.10)
GLUCOSE: 102 mg/dL — AB (ref 65–99)
Potassium: 4.2 mEq/L (ref 3.5–5.3)
Sodium: 137 mEq/L (ref 135–146)
TOTAL PROTEIN: 8.1 g/dL (ref 6.1–8.1)
Total Bilirubin: 0.4 mg/dL (ref 0.2–1.2)

## 2014-09-28 LAB — TSH: TSH: 1.231 u[IU]/mL (ref 0.350–4.500)

## 2014-09-28 LAB — POCT SEDIMENTATION RATE: POCT SED RATE: 24 mm/h — AB (ref 0–22)

## 2014-09-28 MED ORDER — POLYETHYLENE GLYCOL 3350 17 GM/SCOOP PO POWD: 17.0000 g | Freq: Two times a day (BID) | ORAL | Status: DC | PRN

## 2014-09-28 MED ORDER — MAGIC MOUTHWASH: 5.0000 mL | Freq: Four times a day (QID) | ORAL | Status: DC | PRN

## 2014-09-28 MED ORDER — METHOCARBAMOL 500 MG PO TABS: 500.0000 mg | ORAL_TABLET | Freq: Four times a day (QID) | ORAL | Status: DC | PRN

## 2014-09-28 MED ORDER — MELOXICAM 7.5 MG PO TABS: 7.5000 mg | ORAL_TABLET | Freq: Two times a day (BID) | ORAL | Status: DC | PRN

## 2014-09-28 NOTE — Patient Instructions (Addendum)
While you are on the meloxicam Do not use with any other otc pain medication other than tylenol/acetaminophen - so no aleve, ibuprofen, motrin, advil, etc.  If you have taken the meloxicam twice and are still feeling tightness in your back try the methocarbamol Heat to your low back 4x/d, start gentle stretching, yoga, gentle massage.  Put a small pillow or rolled towel behind your low back while at work and try not to hunch over.   Back/Neck: How to set up your workstation   Key Points:  Chair:  An adjustable chair to fit your height with a slight downward tilt of chair seat is helpful.  Should have good high-back support that assists with keeping the natural curves of your spine.  A "lumbar roll" or pillow at your low back can help you keep good posture.  Adjustable arm rests may decrease strain in upper body.   Keyboard:  Should be close and at elbow or just below elbow height.  Split keyboards can help decrease strain on wrist.  Wrist supports can provide mini-breaks to your wrists throughout the day.  Monitor/Screen:  Top of screen should be at eye level or slightly lower.  Good lighting is important to prevent eye strain, or headaches from glare.  Head Posture: A copy holder on either side of the monitor will help limit neck strain.  Head should not be forward.  Ears should line up with you shoulders.  Leg Posture:  Knee and hips should be bent half-way (about a 90 degree angle).  Shorter people may need something to prop their feet on, with knees bent, like a phone book.  Other Stuff: Keep the stuff (phones, pens, phonebooks, etc) that you use frequently close to you, so you're not straining to reach them.   Low Back Sprain with Rehab  A sprain is an injury in which a ligament is torn. The ligaments of the lower back are vulnerable to sprains. However, they are strong and require great force to be injured. These ligaments are important for stabilizing the spinal column. Sprains  are classified into three categories. Grade 1 sprains cause pain, but the tendon is not lengthened. Grade 2 sprains include a lengthened ligament, due to the ligament being stretched or partially ruptured. With grade 2 sprains there is still function, although the function may be decreased. Grade 3 sprains involve a complete tear of the tendon or muscle, and function is usually impaired. SYMPTOMS   Severe pain in the lower back.  Sometimes, a feeling of a "pop," "snap," or tear, at the time of injury.  Tenderness and sometimes swelling at the injury site.  Uncommonly, bruising (contusion) within 48 hours of injury.  Muscle spasms in the back. CAUSES  Low back sprains occur when a force is placed on the ligaments that is greater than they can handle. Common causes of injury include:  Performing a stressful act while off-balance.  Repetitive stressful activities that involve movement of the lower back.  Direct hit (trauma) to the lower back. RISK INCREASES WITH:  Contact sports (football, wrestling).  Collisions (major skiing accidents).  Sports that require throwing or lifting (baseball, weightlifting).  Sports involving twisting of the spine (gymnastics, diving, tennis, golf).  Poor strength and flexibility.  Inadequate protection.  Previous back injury or surgery (especially fusion). PREVENTION  Wear properly fitted and padded protective equipment.  Warm up and stretch properly before activity.  Allow for adequate recovery between workouts.  Maintain physical fitness:  Strength, flexibility, and  endurance.  Cardiovascular fitness.  Maintain a healthy body weight. PROGNOSIS  If treated properly, low back sprains usually heal with non-surgical treatment. The length of time for healing depends on the severity of the injury.  RELATED COMPLICATIONS   Recurring symptoms, resulting in a chronic problem.  Chronic inflammation and pain in the low back.  Delayed  healing or resolution of symptoms, especially if activity is resumed too soon.  Prolonged impairment.  Unstable or arthritic joints of the low back. TREATMENT  Treatment first involves the use of ice and medicine, to reduce pain and inflammation. The use of strengthening and stretching exercises may help reduce pain with activity. These exercises may be performed at home or with a therapist. Severe injuries may require referral to a therapist for further evaluation and treatment, such as ultrasound. Your caregiver may advise that you wear a back brace or corset, to help reduce pain and discomfort. Often, prolonged bed rest results in greater harm then benefit. Corticosteroid injections may be recommended. However, these should be reserved for the most serious cases. It is important to avoid using your back when lifting objects. At night, sleep on your back on a firm mattress, with a pillow placed under your knees. If non-surgical treatment is unsuccessful, surgery may be needed.  MEDICATION   If pain medicine is needed, nonsteroidal anti-inflammatory medicines (aspirin and ibuprofen), or other minor pain relievers (acetaminophen), are often advised.  Do not take pain medicine for 7 days before surgery.  Prescription pain relievers may be given, if your caregiver thinks they are needed. Use only as directed and only as much as you need.  Ointments applied to the skin may be helpful.  Corticosteroid injections may be given by your caregiver. These injections should be reserved for the most serious cases, because they may only be given a certain number of times. HEAT AND COLD  Cold treatment (icing) should be applied for 10 to 15 minutes every 2 to 3 hours for inflammation and pain, and immediately after activity that aggravates your symptoms. Use ice packs or an ice massage.  Heat treatment may be used before performing stretching and strengthening activities prescribed by your caregiver,  physical therapist, or athletic trainer. Use a heat pack or a warm water soak. SEEK MEDICAL CARE IF:   Symptoms get worse or do not improve in 2 to 4 weeks, despite treatment.  You develop numbness or weakness in either leg.  You lose bowel or bladder function.  Any of the following occur after surgery: fever, increased pain, swelling, redness, drainage of fluids, or bleeding in the affected area.  New, unexplained symptoms develop. (Drugs used in treatment may produce side effects.) EXERCISES  RANGE OF MOTION (ROM) AND STRETCHING EXERCISES - Low Back Sprain Most people with lower back pain will find that their symptoms get worse with excessive bending forward (flexion) or arching at the lower back (extension). The exercises that will help resolve your symptoms will focus on the opposite motion.  Your physician, physical therapist or athletic trainer will help you determine which exercises will be most helpful to resolve your lower back pain. Do not complete any exercises without first consulting with your caregiver. Discontinue any exercises which make your symptoms worse, until you speak to your caregiver. If you have pain, numbness or tingling which travels down into your buttocks, leg or foot, the goal of the therapy is for these symptoms to move closer to your back and eventually resolve. Sometimes, these leg symptoms will  get better, but your lower back pain may worsen. This is often an indication of progress in your rehabilitation. Be very alert to any changes in your symptoms and the activities in which you participated in the 24 hours prior to the change. Sharing this information with your caregiver will allow him or her to most efficiently treat your condition. These exercises may help you when beginning to rehabilitate your injury. Your symptoms may resolve with or without further involvement from your physician, physical therapist or athletic trainer. While completing these exercises,  remember:   Restoring tissue flexibility helps normal motion to return to the joints. This allows healthier, less painful movement and activity.  An effective stretch should be held for at least 30 seconds.  A stretch should never be painful. You should only feel a gentle lengthening or release in the stretched tissue. FLEXION RANGE OF MOTION AND STRETCHING EXERCISES: STRETCH - Flexion, Single Knee to Chest   Lie on a firm bed or floor with both legs extended in front of you.  Keeping one leg in contact with the floor, bring your opposite knee to your chest. Hold your leg in place by either grabbing behind your thigh or at your knee.  Pull until you feel a gentle stretch in your low back. Hold __________ seconds.  Slowly release your grasp and repeat the exercise with the opposite side. Repeat __________ times. Complete this exercise __________ times per day.  STRETCH - Flexion, Double Knee to Chest  Lie on a firm bed or floor with both legs extended in front of you.  Keeping one leg in contact with the floor, bring your opposite knee to your chest.  Tense your stomach muscles to support your back and then lift your other knee to your chest. Hold your legs in place by either grabbing behind your thighs or at your knees.  Pull both knees toward your chest until you feel a gentle stretch in your low back. Hold __________ seconds.  Tense your stomach muscles and slowly return one leg at a time to the floor. Repeat __________ times. Complete this exercise __________ times per day.  STRETCH - Low Trunk Rotation  Lie on a firm bed or floor. Keeping your legs in front of you, bend your knees so they are both pointed toward the ceiling and your feet are flat on the floor.  Extend your arms out to the side. This will stabilize your upper body by keeping your shoulders in contact with the floor.  Gently and slowly drop both knees together to one side until you feel a gentle stretch in your  low back. Hold for __________ seconds.  Tense your stomach muscles to support your lower back as you bring your knees back to the starting position. Repeat the exercise to the other side. Repeat __________ times. Complete this exercise __________ times per day  EXTENSION RANGE OF MOTION AND FLEXIBILITY EXERCISES: STRETCH - Extension, Prone on Elbows   Lie on your stomach on the floor, a bed will be too soft. Place your palms about shoulder width apart and at the height of your head.  Place your elbows under your shoulders. If this is too painful, stack pillows under your chest.  Allow your body to relax so that your hips drop lower and make contact more completely with the floor.  Hold this position for __________ seconds.  Slowly return to lying flat on the floor. Repeat __________ times. Complete this exercise __________ times per day.  RANGE  OF MOTION - Extension, Prone Press Ups  Lie on your stomach on the floor, a bed will be too soft. Place your palms about shoulder width apart and at the height of your head.  Keeping your back as relaxed as possible, slowly straighten your elbows while keeping your hips on the floor. You may adjust the placement of your hands to maximize your comfort. As you gain motion, your hands will come more underneath your shoulders.  Hold this position __________ seconds.  Slowly return to lying flat on the floor. Repeat __________ times. Complete this exercise __________ times per day.  RANGE OF MOTION- Quadruped, Neutral Spine   Assume a hands and knees position on a firm surface. Keep your hands under your shoulders and your knees under your hips. You may place padding under your knees for comfort.  Drop your head and point your tailbone toward the ground below you. This will round out your lower back like an angry cat. Hold this position for __________ seconds.  Slowly lift your head and release your tail bone so that your back sags into a large  arch, like an old horse.  Hold this position for __________ seconds.  Repeat this until you feel limber in your low back.  Now, find your "sweet spot." This will be the most comfortable position somewhere between the two previous positions. This is your neutral spine. Once you have found this position, tense your stomach muscles to support your low back.  Hold this position for __________ seconds. Repeat __________ times. Complete this exercise __________ times per day.  STRENGTHENING EXERCISES - Low Back Sprain These exercises may help you when beginning to rehabilitate your injury. These exercises should be done near your "sweet spot." This is the neutral, low-back arch, somewhere between fully rounded and fully arched, that is your least painful position. When performed in this safe range of motion, these exercises can be used for people who have either a flexion or extension based injury. These exercises may resolve your symptoms with or without further involvement from your physician, physical therapist or athletic trainer. While completing these exercises, remember:   Muscles can gain both the endurance and the strength needed for everyday activities through controlled exercises.  Complete these exercises as instructed by your physician, physical therapist or athletic trainer. Increase the resistance and repetitions only as guided.  You may experience muscle soreness or fatigue, but the pain or discomfort you are trying to eliminate should never worsen during these exercises. If this pain does worsen, stop and make certain you are following the directions exactly. If the pain is still present after adjustments, discontinue the exercise until you can discuss the trouble with your caregiver. STRENGTHENING - Deep Abdominals, Pelvic Tilt   Lie on a firm bed or floor. Keeping your legs in front of you, bend your knees so they are both pointed toward the ceiling and your feet are flat on the  floor.  Tense your lower abdominal muscles to press your low back into the floor. This motion will rotate your pelvis so that your tail bone is scooping upwards rather than pointing at your feet or into the floor. With a gentle tension and even breathing, hold this position for __________ seconds. Repeat __________ times. Complete this exercise __________ times per day.  STRENGTHENING - Abdominals, Crunches   Lie on a firm bed or floor. Keeping your legs in front of you, bend your knees so they are both pointed toward the ceiling and  your feet are flat on the floor. Cross your arms over your chest.  Slightly tip your chin down without bending your neck.  Tense your abdominals and slowly lift your trunk high enough to just clear your shoulder blades. Lifting higher can put excessive stress on the lower back and does not further strengthen your abdominal muscles.  Control your return to the starting position. Repeat __________ times. Complete this exercise __________ times per day.  STRENGTHENING - Quadruped, Opposite UE/LE Lift   Assume a hands and knees position on a firm surface. Keep your hands under your shoulders and your knees under your hips. You may place padding under your knees for comfort.  Find your neutral spine and gently tense your abdominal muscles so that you can maintain this position. Your shoulders and hips should form a rectangle that is parallel with the floor and is not twisted.  Keeping your trunk steady, lift your right hand no higher than your shoulder and then your left leg no higher than your hip. Make sure you are not holding your breath. Hold this position for __________ seconds.  Continuing to keep your abdominal muscles tense and your back steady, slowly return to your starting position. Repeat with the opposite arm and leg. Repeat __________ times. Complete this exercise __________ times per day.  STRENGTHENING - Abdominals and Quadriceps, Straight Leg Raise     Lie on a firm bed or floor with both legs extended in front of you.  Keeping one leg in contact with the floor, bend the other knee so that your foot can rest flat on the floor.  Find your neutral spine, and tense your abdominal muscles to maintain your spinal position throughout the exercise.  Slowly lift your straight leg off the floor about 6 inches for a count of 15, making sure to not hold your breath.  Still keeping your neutral spine, slowly lower your leg all the way to the floor. Repeat this exercise with each leg __________ times. Complete this exercise __________ times per day. POSTURE AND BODY MECHANICS CONSIDERATIONS - Low Back Sprain Keeping correct posture when sitting, standing or completing your activities will reduce the stress put on different body tissues, allowing injured tissues a chance to heal and limiting painful experiences. The following are general guidelines for improved posture. Your physician or physical therapist will provide you with any instructions specific to your needs. While reading these guidelines, remember:  The exercises prescribed by your provider will help you have the flexibility and strength to maintain correct postures.  The correct posture provides the best environment for your joints to work. All of your joints have less wear and tear when properly supported by a spine with good posture. This means you will experience a healthier, less painful body.  Correct posture must be practiced with all of your activities, especially prolonged sitting and standing. Correct posture is as important when doing repetitive low-stress activities (typing) as it is when doing a single heavy-load activity (lifting). RESTING POSITIONS Consider which positions are most painful for you when choosing a resting position. If you have pain with flexion-based activities (sitting, bending, stooping, squatting), choose a position that allows you to rest in a less flexed  posture. You would want to avoid curling into a fetal position on your side. If your pain worsens with extension-based activities (prolonged standing, working overhead), avoid resting in an extended position such as sleeping on your stomach. Most people will find more comfort when they rest with their  spine in a more neutral position, neither too rounded nor too arched. Lying on a non-sagging bed on your side with a pillow between your knees, or on your back with a pillow under your knees will often provide some relief. Keep in mind, being in any one position for a prolonged period of time, no matter how correct your posture, can still lead to stiffness. PROPER SITTING POSTURE In order to minimize stress and discomfort on your spine, you must sit with correct posture. Sitting with good posture should be effortless for a healthy body. Returning to good posture is a gradual process. Many people can work toward this most comfortably by using various supports until they have the flexibility and strength to maintain this posture on their own. When sitting with proper posture, your ears will fall over your shoulders and your shoulders will fall over your hips. You should use the back of the chair to support your upper back. Your lower back will be in a neutral position, just slightly arched. You may place a small pillow or folded towel at the base of your lower back for  support.  When working at a desk, create an environment that supports good, upright posture. Without extra support, muscles tire, which leads to excessive strain on joints and other tissues. Keep these recommendations in mind: CHAIR:  A chair should be able to slide under your desk when your back makes contact with the back of the chair. This allows you to work closely.  The chair's height should allow your eyes to be level with the upper part of your monitor and your hands to be slightly lower than your elbows. BODY POSITION  Your feet  should make contact with the floor. If this is not possible, use a foot rest.  Keep your ears over your shoulders. This will reduce stress on your neck and low back. INCORRECT SITTING POSTURES  If you are feeling tired and unable to assume a healthy sitting posture, do not slouch or slump. This puts excessive strain on your back tissues, causing more damage and pain. Healthier options include:  Using more support, like a lumbar pillow.  Switching tasks to something that requires you to be upright or walking.  Talking a brief walk.  Lying down to rest in a neutral-spine position. PROLONGED STANDING WHILE SLIGHTLY LEANING FORWARD  When completing a task that requires you to lean forward while standing in one place for a long time, place either foot up on a stationary 2-4 inch high object to help maintain the best posture. When both feet are on the ground, the lower back tends to lose its slight inward curve. If this curve flattens (or becomes too large), then the back and your other joints will experience too much stress, tire more quickly, and can cause pain. CORRECT STANDING POSTURES Proper standing posture should be assumed with all daily activities, even if they only take a few moments, like when brushing your teeth. As in sitting, your ears should fall over your shoulders and your shoulders should fall over your hips. You should keep a slight tension in your abdominal muscles to brace your spine. Your tailbone should point down to the ground, not behind your body, resulting in an over-extended swayback posture.  INCORRECT STANDING POSTURES  Common incorrect standing postures include a forward head, locked knees and/or an excessive swayback. WALKING Walk with an upright posture. Your ears, shoulders and hips should all line-up. PROLONGED ACTIVITY IN A FLEXED POSITION When completing  a task that requires you to bend forward at your waist or lean over a low surface, try to find a way to  stabilize 3 out of 4 of your limbs. You can place a hand or elbow on your thigh or rest a knee on the surface you are reaching across. This will provide you more stability, so that your muscles do not tire as quickly. By keeping your knees relaxed, or slightly bent, you will also reduce stress across your lower back. CORRECT LIFTING TECHNIQUES DO :  Assume a wide stance. This will provide you more stability and the opportunity to get as close as possible to the object which you are lifting.  Tense your abdominals to brace your spine. Bend at the knees and hips. Keeping your back locked in a neutral-spine position, lift using your leg muscles. Lift with your legs, keeping your back straight.  Test the weight of unknown objects before attempting to lift them.  Try to keep your elbows locked down at your sides in order get the best strength from your shoulders when carrying an object.  Always ask for help when lifting heavy or awkward objects. INCORRECT LIFTING TECHNIQUES DO NOT:   Lock your knees when lifting, even if it is a small object.  Bend and twist. Pivot at your feet or move your feet when needing to change directions.  Assume that you can safely pick up even a paperclip without proper posture. Document Released: 02/20/2005 Document Revised: 05/15/2011 Document Reviewed: 06/04/2008 [facility] Patient Information 2015 [place], [place]. This information is not intended to replace advice given to you by your health care provider. Make sure you discuss any questions you have with your health care provider. To bn (Constipation) To bn l khi m?t ng??i ?i ??i ti?n t h?n ba l?n trong m?t tu?n, ??i ti?n kh kh?n, ho?c ??i ti?n ra phn kh, c?ng, ho?c to h?n bnh th??ng. Khi tu?i cng cao th cng d? b? to bn. N?u qu v? c? g?ng ch?a to bn b?ng cc lo?i thu?c gip qu v? ??i ti?n ???c (thu?c nhu?n trng), b?nh c th? n?ng thm. S? d?ng thu?c nhu?n trng trong th?i gian di c th? lm cho  cc c? c?a ru?t gi y?u ?i. Ch? ?? ?n thi?u ch?t x?, khng u?ng ?? n??c, v dng m?t s? lo?i thu?c nh?t ??nh c th? lm to bo n?ng thm.  NGUYN NHN.   M?t s? lo?i thu?c nh?t ??nh nh? thu?c ch?ng tr?m c?m, thu?c gi?m ?au, thu?c c b? sung s?t, thu?c trung ha axit d?ch v?, v thu?c l?i ti?u.  M?t s? b?nh l nh?t ??nh nh? ti?u ???ng, h?i ch?ng ru?t kch thch (IBS), b?nh c?a tuy?n gip, ho?c tr?m c?m.  Khng u?ng ?? n??c.  Khng ?n ?? th?c ?n giu ch?t x?.  C?ng th?ng ho?c do ?i l?i.  t ho?t ??ng thn th? ho?c th? d?c.  Nh?n ?i ??i ti?n.  S? d?ng qu nhi?u thu?c nhu?n trng. D?U HI?U V TRI?U CH?NG   ?i ??i ti?n t h?n ba l?n m?i tu?n.  Ph?i r?n m?nh ?? ??i ti?n.  ??i ti?n ra phn c?ng, kh, ho?c to h?n bnh th??ng.  C?m th?y ??y b?ng ho?c ch??ng b?ng.  ?au ? vng b?ng d??i.  Khng c?m th?y tho?i mi sau khi ??i ti?n. CH?N ?ON  Chuyn gia ch?m [place] s?c kh?e c?a qu v? s? h?i v? b?nh s? v khm th?c th? cho qu v?. C th? c?n ki?m tra thm trong  tr??ng h?p to bn n?ng. M?t s? ki?m tra c th? bao g?m:  Ch?p X quang c ch?t c?n quang ?? ki?m tra tr?c trng, ??i trng, v ?i khi c? ru?t non c?a qu v?.  N?i soi tr?c trng sigma ?? ki?m tra ph?n pha d??i c?a ??i trng.  Th? thu?t soi ??i trng ?? khm ton b? ??i trng. ?I?U TR?  ?i?u tr? ty thu?c vo m?c ?? tr?m tr?ng c?a ch?ng to bn v nguyn nhn gy to bn. ?i?u tr? thng qua ch? ?? ?n u?ng bao g?m u?ng nhi?u n??c h?n v ?n th?c ?n c nhi?u ch?t x? h?n. ?i?u tr? thng qua l?i s?ng c th? bao g?m vi?c t?p th? d?c th??ng xuyn. N?u nh?ng khuy?n ngh? v? ch? ?? ?n v l?i s?ng khng c tc d?ng, chuyn gia ch?m [place] s?c kh?e c th? khuy?n ngh? qu v? dng cc lo?i thu?c nhu?n trng khng c?n k ??n ?? gip qu v? ??i ti?n. C th? ph?i k ??n thu?c c?n k ??n n?u thu?c khng c?n k ??n khng c tc d?ng.  H??NG D?N CH?M [place] T?I NH   ?n th?c ?n c nhi?u ch?t x? nh? tri cy, rau, ng? c?c nguyn h?t, v cc lo?i  ??u.  H?n ch? th?c ?n c nhi?u ch?t bo v ???ng ch? bi?n s?n, ch?ng h?n khoai ty chin, bnh hamburger, bnh quy, k?o, v soda.  C th? dng th?c ph?m ch?c n?ng c b? sung ch?t x? vo kh?u ph?n ?n c?a qu v? n?u qu v? khng th? dng ?? ch?t x? t? th?c ?n.  U?ng ?? n??c ?? gi? cho n??c ti?u trong ho?c vng nh?t.  T?p th? d?c th??ng xuyn ho?c theo ch? d?n c?a chuyn gia ch?m [place] s?c kh?e.  Vo nh v? sinh ngay khi qu v? c nhu c?u. Khng nh?n ?i ??i ti?n.  Ch? s? d?ng thu?c khng c?n k ??n ho?c thu?c c?n k ??n theo ch? d?n c?a chuyn gia ch?m [place] s?c kh?e.Khng dng cc lo?i thu?c ch?ng to bn khc m khng bn b?c tr??c v?i chuyn gia ch?m [place] s?c kh?e. NGAY L?P T?C ?I KHM N?U:   ?i ??i ti?n ra mu ?? t??i.  Ch?ng to bn ko di h?n 4 ngy v tr?m tr?ng h?n.  Qu v? b? ?au b?ng ho?c ?au tr?c trng.  Phn c?a qu v? m?ng, trng nh? bt ch.  Qu v? b? s?t cn khng r nguyn nhn. ??M B?O QU V?:   Hi?u r cc h??ng d?n ny.  S? theo di tnh tr?ng c?a mnh.  S? yu c?u tr? gip ngay l?p t?c n?u qu v? c?m th?y khng kh?e ho?c th?y tr?m tr?ng h?n. Document Released: 06/07/2010 Document Revised: 02/25/2013 [facility] Patient Information 2015 [place]. This information is not intended to replace advice given to you by your health care provider. Make sure you discuss any questions you have with your health care provider.

## 2014-09-28 NOTE — Progress Notes (Signed)
Subjective:    Patient ID: Cristina Boyd, female    DOB: [birth year], 44 y.o.   MRN: [id number]  Chief Complaint  Patient presents with  . Back Pain    x3 days, lower back. Pain is spreading down her legs.     HPI NKI. Sxs began insidously in bilateral low back and go down bilateral thighs to knees, + weakness which prevent her from standing for a long time but no falls, no numbness, nml GI/GU. Knees and bilateral hands also having bony pain.  Tried some vitamin D and some glucosamine-chondroiton - not tried tylenol/ibuprofen.  Takes vitamins every day at baseline as well as omega-3 supp  Had CPE 2 yrs prev with Dr. Carlota Raspberry with neg pap. Neg hgba1c 2 yrs ago. Esr only mildly elev. nml vit B12.  Did have a vit D def  18 mos prior  History reviewed. No pertinent past medical history. History reviewed. No pertinent past surgical history. No current outpatient prescriptions on file prior to visit.   No current facility-administered medications on file prior to visit.   No Known Allergies Family History  Problem Relation Age of Onset  . Hypertension Mother    History   Social History  . Marital Status: Married    Spouse Name: N/A  . Number of Children: N/A  . Years of Education: N/A   Social History Main Topics  . Smoking status: Never Smoker   . Smokeless tobacco: Not on file  . Alcohol Use: No  . Drug Use: No  . Sexual Activity: Yes   Other Topics Concern  . None   Social History Narrative    Review of Systems  Constitutional: Negative for fever and chills.  Gastrointestinal: Negative for nausea, vomiting, abdominal pain, diarrhea and constipation.  Genitourinary: Negative for urgency, frequency, decreased urine volume and difficulty urinating.  Musculoskeletal: Positive for myalgias, back pain and arthralgias. Negative for joint swelling and gait problem.  Neurological: Positive for weakness. Negative for syncope and numbness.  Hematological: Negative for adenopathy.  Does not bruise/bleed easily.       Objective:  BP 110/62 mmHg  Pulse 67  Temp(Src) 97.3 F (36.3 C) (Oral)  Resp 18  Ht 5' 3.25" (1.607 m)  Wt 133 lb 3.2 oz (60.419 kg)  BMI 23.40 kg/m2  SpO2 99%  LMP 09/12/2014  Physical Exam  Constitutional: She is oriented to person, place, and time. She appears well-developed and well-nourished.  HENT:  Head: Normocephalic.  Eyes: Conjunctivae are normal. No scleral icterus.  Neck: Normal range of motion. Neck supple.  Cardiovascular: Normal rate, regular rhythm and normal heart sounds.   Pulmonary/Chest: Effort normal and breath sounds normal. No respiratory distress.  Musculoskeletal: Normal range of motion. She exhibits no edema.       Right hip: Normal.       Left hip: Normal.       Lumbar back: She exhibits tenderness, pain and spasm. She exhibits normal range of motion, no bony tenderness and no deformity.  Neurological: She is alert and oriented to person, place, and time. She has normal strength and normal reflexes. No sensory deficit. She exhibits normal muscle tone. Coordination and gait normal.  Reflex Scores:      Patellar reflexes are 2+ on the right side and 2+ on the left side.      Achilles reflexes are 2+ on the right side and 2+ on the left side. Skin: Skin is warm and dry. No erythema.  Psychiatric: She has  a normal mood and affect. Her behavior is normal.      [facility] reading (PRIMARY) by  Dr. Brigitte Pulse. Lumbar spine: loss of normal lumbar lordosis, no vertebral abnormality, no acute abnormality, moderate stool burden, IUD in place  Dg Lumbar Spine 2-3 Views  09/28/2014   CLINICAL DATA:  Low back pain with sciatica.  No known injury.  EXAM: LUMBAR SPINE - 2-3 VIEW  COMPARISON:  None.  FINDINGS: There is no evidence of lumbar spine fracture. Alignment is normal. Slight disc space narrowing at L5-S1. Remainder of the disc spaces are maintained. SI joints are symmetric and unremarkable.  IUD is noted within the pelvis.   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  No acute findings.   Electronically Signed   By: Rolm Baptise M.D.   On: 09/28/2014 11:42       Assessment & Plan:   1. 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  Try heat and gentle stretching.  Start lumbar support and watch posture.  2. Acute pharyngitis, unspecified pharyngitis type   3. Arthritis - cont glucosamine-chondroiton  4. Vitamin D deficiency   5. Constipation, unspecified constipation type - seen on xray - likely making back worse, start qd miralax  6.      HPL - recently started omega-3 supp as was instructed.    Orders Placed This Encounter  Procedures  . Urine culture  . DG Lumbar Spine 2-3 Views    Standing Status: Future     Number of Occurrences: 1     Standing Expiration Date: 09/28/2015    Order Specific Question:  Reason for Exam (SYMPTOM  OR DIAGNOSIS REQUIRED)    Answer:  bilateral low back pain    Order Specific Question:  Is the patient pregnant?    Answer:  No    Order Specific Question:  Preferred imaging location?    Answer:  External  . Comprehensive metabolic panel  . TSH  . Ambulatory referral to Physical Therapy    Referral Priority:  Routine    Referral Type:  Physical Medicine    Referral Reason:  Specialty Services Required    Requested Specialty:  Physical Therapy    Number of Visits Requested:  1  . POCT UA - Microscopic Only  . POCT urinalysis dipstick  . POCT urine pregnancy  . POCT CBC  . POCT SEDIMENTATION RATE    Meds ordered this encounter  Medications  . Alum & Mag Hydroxide-Simeth (MAGIC MOUTHWASH) SOLN    Sig: Take 5 mLs by mouth 4 (four) times daily as needed for mouth pain.    Dispense:  360 mL    Refill:  0    Mix diphenhydramine, hydrocortison, and  maalox together in 1:1:1 ratio. No lidocaine or nystatin. Ok to use  pharmacy formulary  . methocarbamol (ROBAXIN) 500 MG tablet    Sig: Take 1 tablet (500 mg total) by mouth every 6 (six) hours as needed for muscle spasms (for back).    Dispense:  60 tablet    Refill:  0  . meloxicam (MOBIC) 7.5 MG tablet    Sig: Take 1 tablet (7.5 mg total) by mouth 2 (two) times daily as needed for pain (for back).    Dispense:  60 tablet    Refill:  1    Do not use with any other otc pain medication  other than tylenol/acetaminophen - so no aleve, ibuprofen, motrin, advil, etc.  . polyethylene glycol powder (GLYCOLAX/MIRALAX) powder    Sig: Take 17 g by mouth 2 (two) times daily as needed.    Dispense:  255 g    Refill:  11    Cristina Cheadle, MD MPH  Results for orders placed or performed in visit on 09/28/14  Urine culture  Result Value Ref Range   Colony Count 30,000 COLONIES/ML    Organism ID, Bacteria GROUP B STREP (S.AGALACTIAE) ISOLATED   Comprehensive metabolic panel  Result Value Ref Range   Sodium 137 135 - 146 mEq/L   Potassium 4.2 3.5 - 5.3 mEq/L   Chloride 103 98 - 110 mEq/L   CO2 27 20 - 31 mEq/L   Glucose, Bld 102 (H) 65 - 99 mg/dL   BUN 14 7 - 25 mg/dL   Creat 0.58 0.50 - 1.10 mg/dL   Total Bilirubin 0.4 0.2 - 1.2 mg/dL   Alkaline Phosphatase 49 33 - 115 U/L   AST 19 10 - 30 U/L   ALT 21 6 - 29 U/L   Total Protein 8.1 6.1 - 8.1 g/dL   Albumin 4.5 3.6 - 5.1 g/dL   Calcium 9.4 8.6 - 10.2 mg/dL  TSH  Result Value Ref Range   TSH 1.231 0.350 - 4.500 uIU/mL  POCT UA - Microscopic Only  Result Value Ref Range   WBC, Ur, HPF, POC 0-2    RBC, urine, microscopic neg    Bacteria, U Microscopic tr    Mucus, UA neg    Epithelial cells, urine per micros 0-1    Crystals, Ur, HPF, POC neg    Casts, Ur, LPF, POC neg    Yeast, UA neg   POCT urinalysis dipstick  Result Value Ref Range   Color, UA yellow    Clarity, UA clear    Glucose, UA neg    Bilirubin, UA neg    Ketones, UA neg    Spec Grav, UA 1.010    Blood, UA trace lysed    pH, UA 7.0     Protein, UA neg    Urobilinogen, UA 0.2    Nitrite, UA neg    Leukocytes, UA Trace (A) Negative  POCT urine pregnancy  Result Value Ref Range   Preg Test, Ur Negative Negative  POCT CBC  Result Value Ref Range   WBC 3.9 (A) 4.6 - 10.2 K/uL   Lymph, poc 1.3 0.6 - 3.4   POC LYMPH PERCENT 33.5 10 - 50 %L   MID (cbc) 0.3 0 - 0.9   POC MID % 8.3 0 - 12 %M   POC Granulocyte 2.3 2 - 6.9   Granulocyte percent 58.2 37 - 80 %G   RBC 4.40 4.04 - 5.48 M/uL   Hemoglobin 12.1 (A) 12.2 - 16.2 g/dL   HCT, POC 38.3 37.7 - 47.9 %   MCV 86.9 80 - 97 fL   MCH, POC 27.6 27 - 31.2 pg   MCHC 31.8 31.8 - 35.4 g/dL   RDW, POC 13.9 %   Platelet Count, POC 288 142 - 424 K/uL   MPV 7.1 0 - 99.8 fL  POCT SEDIMENTATION RATE  Result Value Ref Range   POCT SED RATE 24 (A) 0 - 22 mm/hr

## 2014-09-29 ENCOUNTER — Encounter: Payer: Self-pay | Admitting: Family Medicine

## 2014-09-29 LAB — URINE CULTURE

## 2014-10-01 ENCOUNTER — Telehealth: Payer: Self-pay

## 2014-10-01 NOTE — Telephone Encounter (Signed)
Pharm called, stated they do not have the liquid hydrocortisone to mix MMW as Dr Brigitte Pulse specified in her Rx comment. They can crush 60 mg of hydrocortisone tablets into a 1:1 mixture of Benedryl and maalox mixture. I checked w/Mike Bess Harvest who verbally OKd it and I informed pharmacist. Ronalee Belts, forwarding to you to comment.

## 2014-10-01 NOTE — Telephone Encounter (Signed)
Per Dr. Raul Del script instructions, I said this would be okay since Dr. Brigitte Pulse did not want lidocaine in mixture. Thank you, Pamala Hurry!

## 2014-10-03 NOTE — Telephone Encounter (Signed)
Fine

## 2014-12-15 ENCOUNTER — Ambulatory Visit (INDEPENDENT_AMBULATORY_CARE_PROVIDER_SITE_OTHER): Payer: No Typology Code available for payment source | Admitting: Family Medicine

## 2014-12-15 VITALS — BP 110/68 | HR 68 | Temp 97.8°F | Resp 18 | Ht 63.0 in | Wt 132.0 lb

## 2014-12-15 DIAGNOSIS — N390 Urinary tract infection, site not specified: Secondary | ICD-10-CM

## 2014-12-15 DIAGNOSIS — K648 Other hemorrhoids: Secondary | ICD-10-CM | POA: Diagnosis not present

## 2014-12-15 DIAGNOSIS — A499 Bacterial infection, unspecified: Secondary | ICD-10-CM | POA: Diagnosis not present

## 2014-12-15 DIAGNOSIS — Z23 Encounter for immunization: Secondary | ICD-10-CM

## 2014-12-15 DIAGNOSIS — N898 Other specified noninflammatory disorders of vagina: Secondary | ICD-10-CM | POA: Diagnosis not present

## 2014-12-15 DIAGNOSIS — G4700 Insomnia, unspecified: Secondary | ICD-10-CM | POA: Diagnosis not present

## 2014-12-15 LAB — POC MICROSCOPIC URINALYSIS (UMFC): Mucus: ABSENT

## 2014-12-15 LAB — POCT WET + KOH PREP
Trich by wet prep: ABSENT
Yeast by KOH: ABSENT
Yeast by wet prep: ABSENT

## 2014-12-15 LAB — POCT URINALYSIS DIP (MANUAL ENTRY)
Bilirubin, UA: NEGATIVE
Glucose, UA: NEGATIVE
Ketones, POC UA: NEGATIVE
Nitrite, UA: NEGATIVE
Protein Ur, POC: NEGATIVE
Spec Grav, UA: 1.01
Urobilinogen, UA: 0.2
pH, UA: 7

## 2014-12-15 MED ORDER — CEPHALEXIN 500 MG PO CAPS: 500.0000 mg | ORAL_CAPSULE | Freq: Two times a day (BID) | ORAL | Status: DC

## 2014-12-15 NOTE — Patient Instructions (Signed)
B?nh Tr? (Hemorrhoids) Tr? l cc t?nh m?ch b? s?ng xung quanh tr?c trng ho?c h?u mn. C hai lo?i tr?:  Tr? n?i. Lo?i ny xu?t hi?n trong cc t?nh m?ch ngay bn trong tr?c trng. Chng c th? ch?c xuyn qua bn ngoi v b? kch thch v ?au.  Tr? ngo?i. Lo?i ny xu?t hi?n ? cc t?nh m?ch bn ngoi h?u mn v c th? c?m th?y s?ng ?au ho?c m?t c?c s?ng c?ng g?n h?u mn. Amanda [place].  Bo ph.  To bn ho?c tiu ch?y.  R?n ?? ??i ti?n.  Ng?i lu trong nh v? sinh.  Nng v?t n?ng ho?c ho?t ??ng khc khi?n b?n c?ng th?ng.  Giao h?p qua ???ng h?u mn. TRI?U CH?NG  ?au.  Ng?a ho?c kch thch h?u mn.  Ch?y mu tr?c trng.  R r? phn.  S?ng h?u mn.  M?t ho?c nhi?u c?c xung quanh h?u mn. CH?N ?ON [place] gia ch?m [place] s?c kh?e c th? ch?n ?on b?nh tr? b?ng cch khm b?ng m?t th??ng. Cc ki?m tra v xt nghi?m khc c th? ???c th?c hi?n bao g?m:  Ki?m tra khu v?c tr?c trng b?ng tay ?eo g?ng (ki?m tra tr?c trng b?ng ngn tay).  Ki?m tra ?ng h?u mn b?ng cch s? d?ng m?t ?ng nh? (?ng soi).  Xt nghi?m mu n?u b?n b? m?t m?t l??ng mu ?ng k?.  M?t ki?m tra ?? xem bn trong ??i trng (soi ??i trng sigma ho??c soi ??i trng). ?I?U TR? H?u h?t b?nh tr? c th? ???c ?i?u tr? t?i nh. Tuy nhin, n?u cc tri?u ch?ng c v? khng kh h?n ho?c n?u b?n b? ch?y mu tr?c trng nhi?u, chuyn gia ch?m [place] s?c kh?e c th? th?c hi?n m?t th? thu?t ?? gip lm cho tr? nh? h?n ho?c lo?i b? chng hon ton. Cc ph??ng php ?i?u tr? c th? bao g?m:  ??t m?t b?ng cao su t?i chn tr? ?? c?t ??t tu?n hon mu (th?t ??ng m?ch b?ng b?ng cao su).  Tim ha ch?t ?? thu nh? tr? (li?u php x? ho).  S? d?ng m?t d?ng c? ?? ??t tr? (li?u php nh sng h?ng ngo?i).  Ph?u thu?t lo?i b? tr? (th? thu?t c?t tr?).  K?p tr? ?? ch?n dng mu ??n m (k?p tr?). H??NG D?N CH?M [place] T?I NH  ?n th?c ?n c nhi?u ch?t x? nh? ng? c?c, ??u, cc lo?i h?t, tri cy v rau. Hy h?i bc s? v? vi?c dng  cc s?n ph?m c b? sung ch?t x? (b? sung ch?t x?).  U?ng nhi?u n??c h?n. U?ng ?? n??c v dung d?ch ?? n??c ti?u trong ho?c c mu vng nh?t.  T?p th? d?c th??ng xuyn.  ?i v? sinh khi b?n c nhu c?u. Khng ch?.  Trnh r?n khi ?i ??i ti?n.  Gi? vng h?u mn kh v s?ch s?. S? d?ng gi?y v? sinh ??t ho?c kh?n gi?y ?m sau khi ??i ti?n.  Thu?c kem v thu?c ??n c th? ???c s? d?ng ho?c p d?ng theo ch? d?n.  Ch? s? d?ng thu?c khng c?n k toa ho?c thu?c c?n k toa theo ch? d?n c?a chuyn gia ch?m [place] s?c kh?e.  T?m ng?i trong n??c ?m kho?ng 15-20 pht, 3-4 l?n m?t ngy ?? gi?m ?au v kh ch?u.  Ch??m ? l?nh vo ch? tr? n?u n nh?y c?m ?au v s?ng ln. Vi?c s? d?ng cc gi ch??m ? gi?a cc l?n t?m ng?i c th? h?u ch.  Cho ?  l?nh vo ti nh?a.  ??t kh?n t?m gi?a da v ti.  ?? ? l?nh kho?ng 15-20 pht, 3-4 l?n m?t ngy.  Khng s? d?ng g?i hnh bnh rn ho?c ng?i lu trn b?n c?u. ?i?u ny lm t?ng tch t? mu v ?au. HY ?I KHM N?U:  B?n b? ?au v s?ng t?ng ln khng ki?m sot ???c b?ng ?i?u tr? ho?c thu?c.  B?n b? ch?y mu khng ki?m sot ???c.  B?n g?p kh kh?n ho?c khng th? ?i ??i ti?n.  B?n b? ?au ho?c vim nhi?m bn ngoi khu v?c tr?Marland Kitchen ??M B?O B?N:  Hi?u cc h??ng d?n ny.  S? theo di tnh tr?ng c?a mnh.  S? yu c?u tr? gip ngay l?p t?c n?u b?n c?m th?y khng ?? ho?c tnh tr?ng tr?m tr?ng h?n.   Thng tin ny khng nh?m m?c ?ch thay th? cho l?i khuyn m chuyn gia ch?m [place] s?c kh?e ni v?i qu v?. Hy b?o ??m qu v? ph?i th?o lu?n b?t k? v?n ?? g m qu v? c v?i chuyn gia ch?m [place] s?c kh?e c?a qu v?.   Document Released: 11/30/2004 Document Revised: 10/23/2012 Elsevier Interactive Patient Education [employer].

## 2014-12-15 NOTE — Progress Notes (Signed)
Chief Complaint:  Chief Complaint  Patient presents with  . Abdominal Pain    pt. has IUD implant and feels that could be the problem./ x 3 days  . Gynecologic Exam  . Immunizations    flu shot    HPI: Cristina Boyd is a 44 y.o. female who reports to [facility] today complaining of 3 day hx of vaginal dc, some intermittent abd pain bilalterally, no fevers, n/v/back pain.  No dysuria. She has IUD from 2010 but not sure if Mirena or paraguard. She has had sxs of vaginal dc before.    Hemorrhoids started after childbearing, she states it has bothered her when she wipes and she has tried to use water to clean so it does not irritate, no bleeding, no itching, just noticed there is a skin tag that she feels under the tissue when she wipes and would like me to check it out to see if there is anything else she needs to do. No BBPR  Would like flu vacine today  History reviewed. No pertinent past medical history. History reviewed. No pertinent past surgical history. Social History   Social History  . Marital Status: Married    Spouse Name: N/A  . Number of Children: N/A  . Years of Education: N/A   Social History Main Topics  . Smoking status: Never Smoker   . Smokeless tobacco: None  . Alcohol Use: No  . Drug Use: No  . Sexual Activity: Yes   Other Topics Concern  . None   Social History Narrative   Family History  Problem Relation Age of Onset  . Hypertension Mother    No Known Allergies Prior to Admission medications   Medication Sig Start Date End Date Taking? Authorizing Provider  Alum & Mag Hydroxide-Simeth (MAGIC MOUTHWASH) SOLN Take 5 mLs by mouth 4 (four) times daily as needed for mouth pain. Patient not taking: Reported on 12/15/2014 09/28/14   Shawnee Knapp, MD  meloxicam (MOBIC) 7.5 MG tablet Take 1 tablet (7.5 mg total) by mouth 2 (two) times daily as needed for pain (for back). Patient not taking: Reported on 12/15/2014 09/28/14   Shawnee Knapp, MD  methocarbamol  (ROBAXIN) 500 MG tablet Take 1 tablet (500 mg total) by mouth every 6 (six) hours as needed for muscle spasms (for back). Patient not taking: Reported on 12/15/2014 09/28/14   Shawnee Knapp, MD  polyethylene glycol powder (GLYCOLAX/MIRALAX) powder Take 17 g by mouth 2 (two) times daily as needed. Patient not taking: Reported on 12/15/2014 09/28/14   Shawnee Knapp, MD     ROS: The patient denies fevers, chills, night sweats, unintentional weight loss, chest pain, palpitations, wheezing, dyspnea on exertion, nausea, vomiting, abdominal pain, dysuria, hematuria, melena, numbness, weakness, or tingling.   All other systems have been reviewed and were otherwise negative with the exception of those mentioned in the HPI and as above.    PHYSICAL EXAM: Filed Vitals:   12/15/14 0833  BP: 110/68  Pulse: 68  Temp: 97.8 F (36.6 C)  Resp: 18   Body mass index is 23.39 kg/(m^2).   General: Alert, no acute distress HEENT:  Normocephalic, atraumatic, oropharynx patent. EOMI, PERRLA Cardiovascular:  Regular rate and rhythm, no rubs murmurs or gallops.  No Carotid bruits, radial pulse intact. No pedal edema.  Respiratory: Clear to auscultation bilaterally.  No wheezes, rales, or rhonchi.  No cyanosis, no use of accessory musculature Abdominal: No organomegaly, abdomen is soft and non-tender,  positive bowel sounds. No masses. Skin: No rashes. Neurologic: Facial musculature symmetric. Psychiatric: Patient acts appropriately throughout our interaction. Lymphatic: No cervical or submandibular lymphadenopathy Musculoskeletal: Gait intact. No edema, tenderness GU-no masses, lesion, rashes, + DC, IUD in place, no CMT.  + external hemorrhoid   LABS: Results for orders placed or performed in visit on 12/15/14  POCT urinalysis dipstick  Result Value Ref Range   Color, UA yellow yellow   Clarity, UA clear clear   Glucose, UA negative negative   Bilirubin, UA negative negative   Ketones, POC UA negative  negative   Spec Grav, UA 1.010    Blood, UA trace-lysed (A) negative   pH, UA 7.0    Protein Ur, POC negative negative   Urobilinogen, UA 0.2    Nitrite, UA Negative Negative   Leukocytes, UA Trace (A) Negative  POCT Microscopic Urinalysis ([facility])  Result Value Ref Range   WBC,UR,HPF,POC Few (A) None WBC/hpf   RBC,UR,HPF,POC None None RBC/hpf   Bacteria None None   Mucus Absent Absent   Epithelial Cells, UR Per Microscopy Few (A) None cells/hpf  POCT Wet + KOH Prep ([facility])  Result Value Ref Range   Yeast by KOH Absent Present, Absent   Yeast by wet prep Absent Present, Absent   WBC by wet prep Many (A) None, Few   Clue Cells Wet Prep HPF POC Few (A) None   Trich by wet prep Absent Present, Absent   Bacteria Wet Prep HPF POC Moderate (A) None, Few   Epithelial Cells By [employer] Pref ([facility]) Moderate (A) None, Few   RBC,UR,HPF,POC None None RBC/hpf     EKG/XRAY:   Primary read interpreted by Dr. Marin Comment at [facility].   ASSESSMENT/PLAN: Encounter Diagnoses  Name Primary?  . Vaginal discharge Yes  . Flu vaccine need   . Insomnia   . Other hemorrhoids   . Encounter for immunization   . UTI (urinary tract infection), bacterial    Rx Keflex, pending Urine cx IF sxs then and urine neg then consider giving flagyl for BV, although wet prep not impressive Flu vaccine given  Fu prn   Gross sideeffects, risk and benefits, and alternatives of medications d/w patient. Patient is aware that all medications have potential sideeffects and we are unable to predict every sideeffect or drug-drug interaction that may occur.  Thao Le DO  12/15/2014 11:25 AM

## 2014-12-16 ENCOUNTER — Encounter: Payer: Self-pay | Admitting: Family Medicine

## 2014-12-16 LAB — GC/CHLAMYDIA PROBE AMP
CT Probe RNA: NEGATIVE
GC Probe RNA: NEGATIVE

## 2014-12-17 LAB — URINE CULTURE: Colony Count: 30000

## 2015-02-11 ENCOUNTER — Ambulatory Visit (INDEPENDENT_AMBULATORY_CARE_PROVIDER_SITE_OTHER): Payer: No Typology Code available for payment source | Admitting: Family Medicine

## 2015-02-11 ENCOUNTER — Telehealth: Payer: Self-pay | Admitting: *Deleted

## 2015-02-11 VITALS — BP 110/72 | HR 82 | Temp 98.6°F | Resp 17 | Ht 63.0 in | Wt 133.0 lb

## 2015-02-11 DIAGNOSIS — R05 Cough: Secondary | ICD-10-CM

## 2015-02-11 DIAGNOSIS — J029 Acute pharyngitis, unspecified: Secondary | ICD-10-CM

## 2015-02-11 DIAGNOSIS — R059 Cough, unspecified: Secondary | ICD-10-CM

## 2015-02-11 DIAGNOSIS — R509 Fever, unspecified: Secondary | ICD-10-CM

## 2015-02-11 LAB — POCT RAPID STREP A (OFFICE): Rapid Strep A Screen: NEGATIVE

## 2015-02-11 MED ORDER — AZITHROMYCIN 250 MG PO TABS: ORAL_TABLET | ORAL | Status: DC

## 2015-02-11 MED ORDER — BENZONATATE 100 MG PO CAPS: 100.0000 mg | ORAL_CAPSULE | Freq: Three times a day (TID) | ORAL | Status: DC | PRN

## 2015-02-11 MED ORDER — HYDROCODONE-HOMATROPINE 5-1.5 MG/5ML PO SYRP
ORAL_SOLUTION | ORAL | Status: DC
Start: 2015-02-11 — End: 2015-07-04

## 2015-02-11 NOTE — Telephone Encounter (Signed)
Called and left message on cell phone to come back to pickup after visit summary and her 2 prescriptions.  Pt left with out them.

## 2015-02-11 NOTE — Patient Instructions (Signed)
Tessalon during day if needed for cough, hydrocodone cough syrup at night if needed. If cough not better in next 2-3 days - start antibiotic (azithromycin).   Return to the clinic or go to the nearest emergency room if any of your symptoms worsen or new symptoms occur.  Cough, Adult Coughing is a reflex that clears your throat and your airways. Coughing helps to heal and protect your lungs. It is normal to cough occasionally, but a cough that happens with other symptoms or lasts a long time may be a sign of a condition that needs treatment. A cough may last only 2-3 weeks (acute), or it may last longer than 8 weeks (chronic). CAUSES Coughing is commonly caused by:  Breathing in substances that irritate your lungs.  A viral or bacterial respiratory infection.  Allergies.  Asthma.  Postnasal drip.  Smoking.  Acid backing up from the stomach into the esophagus (gastroesophageal reflux).  Certain medicines.  Chronic lung problems, including COPD (or rarely, lung cancer).  Other medical conditions such as heart failure. HOME CARE INSTRUCTIONS  Pay attention to any changes in your symptoms. Take these actions to help with your discomfort:  Take medicines only as told by your health care provider.  If you were prescribed an antibiotic medicine, take it as told by your health care provider. Do not stop taking the antibiotic even if you start to feel better.  Talk with your health care provider before you take a cough suppressant medicine.  Drink enough fluid to keep your urine clear or pale yellow.  If the air is dry, use a cold steam vaporizer or humidifier in your bedroom or your home to help loosen secretions.  Avoid anything that causes you to cough at work or at home.  If your cough is worse at night, try sleeping in a semi-upright position.  Avoid cigarette smoke. If you smoke, quit smoking. If you need help quitting, ask your health care provider.  Avoid  caffeine.  Avoid alcohol.  Rest as needed. SEEK MEDICAL CARE IF:   You have new symptoms.  You cough up pus.  Your cough does not get better after 2-3 weeks, or your cough gets worse.  You cannot control your cough with suppressant medicines and you are losing sleep.  You develop pain that is getting worse or pain that is not controlled with pain medicines.  You have a fever.  You have unexplained weight loss.  You have night sweats. SEEK IMMEDIATE MEDICAL CARE IF:  You cough up blood.  You have difficulty breathing.  Your heartbeat is very fast.   This information is not intended to replace advice given to you by your health care provider. Make sure you discuss any questions you have with your health care provider.   Document Released: 08/19/2010 Document Revised: 11/11/2014 Document Reviewed: 04/29/2014 Elsevier Interactive Patient Education 2016 [employer]  Upper Respiratory Infection, Adult Most upper respiratory infections (URIs) are a viral infection of the air passages leading to the lungs. A URI affects the nose, throat, and upper air passages. The most common type of URI is nasopharyngitis and is typically referred to as "the common cold." URIs run their course and usually go away on their own. Most of the time, a URI does not require medical attention, but sometimes a bacterial infection in the upper airways can follow a viral infection. This is called a secondary infection. Sinus and middle ear infections are common types of secondary upper respiratory infections. Bacterial pneumonia  can also complicate a URI. A URI can worsen asthma and chronic obstructive pulmonary disease (COPD). Sometimes, these complications can require emergency medical care and may be life threatening.  CAUSES Almost all URIs are caused by viruses. A virus is a type of germ and can spread from one person to another.  RISKS FACTORS You may be at risk for a URI if:   You smoke.   You  have chronic heart or lung disease.  You have a weakened defense (immune) system.   You are very young or very old.   You have nasal allergies or asthma.  You work in crowded or poorly ventilated areas.  You work in health care facilities or schools. SIGNS AND SYMPTOMS  Symptoms typically develop 2-3 days after you come in contact with a cold virus. Most viral URIs last 7-10 days. However, viral URIs from the influenza virus (flu virus) can last 14-18 days and are typically more severe. Symptoms may include:   Runny or stuffy (congested) nose.   Sneezing.   Cough.   Sore throat.   Headache.   Fatigue.   Fever.   Loss of appetite.   Pain in your forehead, behind your eyes, and over your cheekbones (sinus pain).  Muscle aches.  DIAGNOSIS  Your health care provider may diagnose a URI by:  Physical exam.  Tests to check that your symptoms are not due to another condition such as:  Strep throat.  Sinusitis.  Pneumonia.  Asthma. TREATMENT  A URI goes away on its own with time. It cannot be cured with medicines, but medicines may be prescribed or recommended to relieve symptoms. Medicines may help:  Reduce your fever.  Reduce your cough.  Relieve nasal congestion. HOME CARE INSTRUCTIONS   Take medicines only as directed by your health care provider.   Gargle warm saltwater or take cough drops to comfort your throat as directed by your health care provider.  Use a warm mist humidifier or inhale steam from a shower to increase air moisture. This may make it easier to breathe.  Drink enough fluid to keep your urine clear or pale yellow.   Eat soups and other clear broths and maintain good nutrition.   Rest as needed.   Return to work when your temperature has returned to normal or as your health care provider advises. You may need to stay home longer to avoid infecting others. You can also use a face mask and careful hand washing to prevent  spread of the virus.  Increase the usage of your inhaler if you have asthma.   Do not use any tobacco products, including cigarettes, chewing tobacco, or electronic cigarettes. If you need help quitting, ask your health care provider. PREVENTION  The best way to protect yourself from getting a cold is to practice good hygiene.   Avoid oral or hand contact with people with cold symptoms.   Wash your hands often if contact occurs.  There is no clear evidence that vitamin C, vitamin E, echinacea, or exercise reduces the chance of developing a cold. However, it is always recommended to get plenty of rest, exercise, and practice good nutrition.  SEEK MEDICAL CARE IF:   You are getting worse rather than better.   Your symptoms are not controlled by medicine.   You have chills.  You have worsening shortness of breath.  You have brown or red mucus.  You have yellow or brown nasal discharge.  You have pain in your face, especially  when you bend forward.  You have a fever.  You have swollen neck glands.  You have pain while swallowing.  You have white areas in the back of your throat. SEEK IMMEDIATE MEDICAL CARE IF:   You have severe or persistent:  Headache.  Ear pain.  Sinus pain.  Chest pain.  You have chronic lung disease and any of the following:  Wheezing.  Prolonged cough.  Coughing up blood.  A change in your usual mucus.  You have a stiff neck.  You have changes in your:  Vision.  Hearing.  Thinking.  Mood. MAKE SURE YOU:   Understand these instructions.  Will watch your condition.  Will get help right away if you are not doing well or get worse.   This information is not intended to replace advice given to you by your health care provider. Make sure you discuss any questions you have with your health care provider.   Document Released: 08/16/2000 Document Revised: 07/07/2014 Document Reviewed: 05/28/2013 Elsevier Interactive Patient  Education [employer].

## 2015-02-11 NOTE — Progress Notes (Addendum)
Subjective:  By signing my name below, I, Cristina Boyd, attest that this documentation has been prepared under the direction and in the presence of Cristina Ray, MD.  Cristina Boyd, Medical Scribe. 02/11/2015.  5:53 PM. I personally performed the services described in this documentation, which was scribed in my presence. The recorded information has been reviewed and considered, and addended by me as needed.      Patient ID: Cristina Boyd, female    DOB: [birth year], 44 y.o.   MRN: [id number]  Chief Complaint  Patient presents with  . Sore Throat    started sunday  . Cough  . Fever    HPI HPI Comments: Cristina Boyd is a 44 y.o. female who presents to [facility] complaining of sore throat, starting onset 5 days ago.  Pt reports associated symptoms of dry cough, subjective fever, and hot and cold symptoms. She notes that the cough is keeping her up at night. Pt notes that both her son and her daughter are experiencing similar symptoms of cough and sore throat. Pt took 6 pills of amoxicillin 500 mg for 3 days for the symptoms, she notes that she was not prescribed this medication, rather a friend provided her with that. Pt also took Daytime Cold medication. She states that her symptoms have been waxing and waning in severity. Pt denies congestion, or rhinorrhea. Pt is UTD with her flu vaccine.    Patient Active Problem List   Diagnosis Date Noted  . Other malaise and fatigue 11/20/2012   Past Medical History  Diagnosis Date  . Fibroadenoma    History reviewed. No pertinent past surgical history. No Known Allergies Prior to Admission medications   Medication Sig Start Date End Date Taking? Authorizing Provider  cephALEXin (KEFLEX) 500 MG capsule Take 1 capsule (500 mg total) by mouth 2 (two) times daily. Patient not taking: Reported on 02/11/2015 12/15/14   Glenford Bayley, DO   Social History   Social History  . Marital Status: Married    Spouse Name: N/A  .  Number of Children: N/A  . Years of Education: N/A   Occupational History  . Not on file.   Social History Main Topics  . Smoking status: Never Smoker   . Smokeless tobacco: Not on file  . Alcohol Use: No  . Drug Use: No  . Sexual Activity: Yes   Other Topics Concern  . Not on file   Social History Narrative    Review of Systems  Constitutional: Positive for fever.  HENT: Positive for sore throat. Negative for congestion and rhinorrhea.   Respiratory: Positive for cough.   Cardiovascular: Negative for leg swelling.      Objective:   Physical Exam  Constitutional: She is oriented to person, place, and time. She appears well-developed and well-nourished. No distress.  HENT:  Head: Normocephalic and atraumatic.  Right Ear: Hearing, tympanic membrane, external ear and ear canal normal.  Left Ear: Hearing, tympanic membrane, external ear and ear canal normal.  Nose: Nose normal.  Mouth/Throat: Oropharynx is clear and moist. No oropharyngeal exudate.  Minimal erythema of the posterior oropharynx. No lymphadenopathy.  No sinus tenderness.   Eyes: Conjunctivae and EOM are normal. Pupils are equal, round, and reactive to light.  Neck: Neck supple.  Cardiovascular: Normal rate, regular rhythm, normal heart sounds and intact distal pulses.   No murmur heard. Pulmonary/Chest: Effort normal and breath sounds normal. No respiratory distress. She has no wheezes. She has  no rhonchi.  Neurological: She is alert and oriented to person, place, and time. No cranial nerve deficit.  Skin: Skin is warm and dry. No rash noted.  Psychiatric: She has a normal mood and affect. Her behavior is normal.  Nursing note and vitals reviewed.   Filed Vitals:   02/11/15 1647  BP: 110/72  Pulse: 82  Temp: 98.6 F (37 C)  TempSrc: Oral  Resp: 17  Height: 5\' 3"  (1.6 m)  Weight: 133 lb (60.328 kg)  SpO2: 98%   Results for orders placed or performed in visit on 02/11/15  POCT rapid strep A    Result Value Ref Range   Rapid Strep A Screen Negative Negative       Assessment & Plan:   Cristina Boyd is a 44 y.o. female Sore throat - Plan: POCT rapid strep A, Culture, Group A Strep  Fever, unspecified - Plan: POCT rapid strep A, Culture, Group A Strep  Cough - Plan: POCT rapid strep A, Culture, Group A Strep, benzonatate (TESSALON) 100 MG capsule, HYDROcodone-homatropine (HYCODAN) 5-1.5 MG/5ML syrup, DISCONTINUED: azithromycin (ZITHROMAX) 250 MG tablet  Suspected upper respiratory infection, viral syndrome. Testing complicated by home use of amoxicillin. Caution/counseled on not taking antibiotic in the future unless prescribed.   -Symptomatic care with Tessalon Perles or Mucinex during the day. Hycodan cough syrup at night if needed.  - Prescription was provided for azithromycin if her symptoms are not improving in the next few days, or fevers do not subside. RTC/ER precautions if worsening.   Meds ordered this encounter  Medications  . DISCONTD: azithromycin (ZITHROMAX) 250 MG tablet    Sig: Take 2 pills by mouth on day 1, then 1 pill by mouth per day on days 2 through 5.    Dispense:  6 tablet    Refill:  0  . benzonatate (TESSALON) 100 MG capsule    Sig: Take 1 capsule (100 mg total) by mouth 3 (three) times daily as needed for cough.    Dispense:  20 capsule    Refill:  0  . HYDROcodone-homatropine (HYCODAN) 5-1.5 MG/5ML syrup    Sig: 58m by mouth a bedtime as needed for cough.    Dispense:  120 mL    Refill:  0   Patient Instructions  Tessalon during day if needed for cough, hydrocodone cough syrup at night if needed. If cough not better in next 2-3 days - start antibiotic (azithromycin).   Return to the clinic or go to the nearest emergency room if any of your symptoms worsen or new symptoms occur.  Cough, Adult Coughing is a reflex that clears your throat and your airways. Coughing helps to heal and protect your lungs. It is normal to cough occasionally, but a cough  that happens with other symptoms or lasts a long time may be a sign of a condition that needs treatment. A cough may last only 2-3 weeks (acute), or it may last longer than 8 weeks (chronic). CAUSES Coughing is commonly caused by:  Breathing in substances that irritate your lungs.  A viral or bacterial respiratory infection.  Allergies.  Asthma.  Postnasal drip.  Smoking.  Acid backing up from the stomach into the esophagus (gastroesophageal reflux).  Certain medicines.  Chronic lung problems, including COPD (or rarely, lung cancer).  Other medical conditions such as heart failure. HOME CARE INSTRUCTIONS  Pay attention to any changes in your symptoms. Take these actions to help with your discomfort:  Take medicines only as told by  your health care provider.  If you were prescribed an antibiotic medicine, take it as told by your health care provider. Do not stop taking the antibiotic even if you start to feel better.  Talk with your health care provider before you take a cough suppressant medicine.  Drink enough fluid to keep your urine clear or pale yellow.  If the air is dry, use a cold steam vaporizer or humidifier in your bedroom or your home to help loosen secretions.  Avoid anything that causes you to cough at work or at home.  If your cough is worse at night, try sleeping in a semi-upright position.  Avoid cigarette smoke. If you smoke, quit smoking. If you need help quitting, ask your health care provider.  Avoid caffeine.  Avoid alcohol.  Rest as needed. SEEK MEDICAL CARE IF:   You have new symptoms.  You cough up pus.  Your cough does not get better after 2-3 weeks, or your cough gets worse.  You cannot control your cough with suppressant medicines and you are losing sleep.  You develop pain that is getting worse or pain that is not controlled with pain medicines.  You have a fever.  You have unexplained weight loss.  You have night  sweats. SEEK IMMEDIATE MEDICAL CARE IF:  You cough up blood.  You have difficulty breathing.  Your heartbeat is very fast.   This information is not intended to replace advice given to you by your health care provider. Make sure you discuss any questions you have with your health care provider.   Document Released: 08/19/2010 Document Revised: 11/11/2014 Document Reviewed: 04/29/2014 Elsevier Interactive Patient Education 2016 [employer] Inc.  Upper Respiratory Infection, Adult Most upper respiratory infections (URIs) are a viral infection of the air passages leading to the lungs. A URI affects the nose, throat, and upper air passages. The most common type of URI is nasopharyngitis and is typically referred to as "the common cold." URIs run their course and usually go away on their own. Most of the time, a URI does not require medical attention, but sometimes a bacterial infection in the upper airways can follow a viral infection. This is called a secondary infection. Sinus and middle ear infections are common types of secondary upper respiratory infections. Bacterial pneumonia can also complicate a URI. A URI can worsen asthma and chronic obstructive pulmonary disease (COPD). Sometimes, these complications can require emergency medical care and may be life threatening.  CAUSES Almost all URIs are caused by viruses. A virus is a type of germ and can spread from one person to another.  RISKS FACTORS You may be at risk for a URI if:   You smoke.   You have chronic heart or lung disease.  You have a weakened defense (immune) system.   You are very young or very old.   You have nasal allergies or asthma.  You work in crowded or poorly ventilated areas.  You work in health care facilities or schools. SIGNS AND SYMPTOMS  Symptoms typically develop 2-3 days after you come in contact with a cold virus. Most viral URIs last 7-10 days. However, viral URIs from the influenza virus (flu  virus) can last 14-18 days and are typically more severe. Symptoms may include:   Runny or stuffy (congested) nose.   Sneezing.   Cough.   Sore throat.   Headache.   Fatigue.   Fever.   Loss of appetite.   Pain in your forehead, behind your eyes,  and over your cheekbones (sinus pain).  Muscle aches.  DIAGNOSIS  Your health care provider may diagnose a URI by:  Physical exam.  Tests to check that your symptoms are not due to another condition such as:  Strep throat.  Sinusitis.  Pneumonia.  Asthma. TREATMENT  A URI goes away on its own with time. It cannot be cured with medicines, but medicines may be prescribed or recommended to relieve symptoms. Medicines may help:  Reduce your fever.  Reduce your cough.  Relieve nasal congestion. HOME CARE INSTRUCTIONS   Take medicines only as directed by your health care provider.   Gargle warm saltwater or take cough drops to comfort your throat as directed by your health care provider.  Use a warm mist humidifier or inhale steam from a shower to increase air moisture. This may make it easier to breathe.  Drink enough fluid to keep your urine clear or pale yellow.   Eat soups and other clear broths and maintain good nutrition.   Rest as needed.   Return to work when your temperature has returned to normal or as your health care provider advises. You may need to stay home longer to avoid infecting others. You can also use a face mask and careful hand washing to prevent spread of the virus.  Increase the usage of your inhaler if you have asthma.   Do not use any tobacco products, including cigarettes, chewing tobacco, or electronic cigarettes. If you need help quitting, ask your health care provider. PREVENTION  The best way to protect yourself from getting a cold is to practice good hygiene.   Avoid oral or hand contact with people with cold symptoms.   Wash your hands often if contact occurs.   There is no clear evidence that vitamin C, vitamin E, echinacea, or exercise reduces the chance of developing a cold. However, it is always recommended to get plenty of rest, exercise, and practice good nutrition.  SEEK MEDICAL CARE IF:   You are getting worse rather than better.   Your symptoms are not controlled by medicine.   You have chills.  You have worsening shortness of breath.  You have brown or red mucus.  You have yellow or brown nasal discharge.  You have pain in your face, especially when you bend forward.  You have a fever.  You have swollen neck glands.  You have pain while swallowing.  You have white areas in the back of your throat. SEEK IMMEDIATE MEDICAL CARE IF:   You have severe or persistent:  Headache.  Ear pain.  Sinus pain.  Chest pain.  You have chronic lung disease and any of the following:  Wheezing.  Prolonged cough.  Coughing up blood.  A change in your usual mucus.  You have a stiff neck.  You have changes in your:  Vision.  Hearing.  Thinking.  Mood. MAKE SURE YOU:   Understand these instructions.  Will watch your condition.  Will get help right away if you are not doing well or get worse.   This information is not intended to replace advice given to you by your health care provider. Make sure you discuss any questions you have with your health care provider.   Document Released: 08/16/2000 Document Revised: 07/07/2014 Document Reviewed: 05/28/2013 Elsevier Interactive Patient Education [employer].

## 2015-02-12 ENCOUNTER — Telehealth: Payer: Self-pay

## 2015-02-12 DIAGNOSIS — R05 Cough: Secondary | ICD-10-CM

## 2015-02-12 DIAGNOSIS — R059 Cough, unspecified: Secondary | ICD-10-CM

## 2015-02-12 MED ORDER — AZITHROMYCIN 250 MG PO TABS: ORAL_TABLET | ORAL | Status: DC

## 2015-02-12 NOTE — Telephone Encounter (Signed)
Resent Rx, pharm did not receive.

## 2015-02-13 LAB — CULTURE, GROUP A STREP: ORGANISM ID, BACTERIA: NORMAL

## 2015-07-04 ENCOUNTER — Ambulatory Visit (INDEPENDENT_AMBULATORY_CARE_PROVIDER_SITE_OTHER): Payer: BLUE CROSS/BLUE SHIELD | Admitting: Family Medicine

## 2015-07-04 VITALS — BP 106/68 | HR 60 | Temp 98.1°F | Resp 16 | Ht 63.0 in | Wt 129.0 lb

## 2015-07-04 DIAGNOSIS — Z0000 Encounter for general adult medical examination without abnormal findings: Secondary | ICD-10-CM

## 2015-07-04 DIAGNOSIS — Z23 Encounter for immunization: Secondary | ICD-10-CM

## 2015-07-04 DIAGNOSIS — Z131 Encounter for screening for diabetes mellitus: Secondary | ICD-10-CM

## 2015-07-04 DIAGNOSIS — Z13220 Encounter for screening for lipoid disorders: Secondary | ICD-10-CM

## 2015-07-04 DIAGNOSIS — Z114 Encounter for screening for human immunodeficiency virus [HIV]: Secondary | ICD-10-CM

## 2015-07-04 DIAGNOSIS — Z124 Encounter for screening for malignant neoplasm of cervix: Secondary | ICD-10-CM | POA: Diagnosis not present

## 2015-07-04 DIAGNOSIS — Z01419 Encounter for gynecological examination (general) (routine) without abnormal findings: Secondary | ICD-10-CM

## 2015-07-04 DIAGNOSIS — R102 Pelvic and perineal pain unspecified side: Secondary | ICD-10-CM

## 2015-07-04 DIAGNOSIS — Z1329 Encounter for screening for other suspected endocrine disorder: Secondary | ICD-10-CM | POA: Diagnosis not present

## 2015-07-04 LAB — CBC WITH DIFFERENTIAL/PLATELET
BASOS PCT: 1 %
Basophils Absolute: 39 cells/uL (ref 0–200)
Eosinophils Absolute: 39 cells/uL (ref 15–500)
Eosinophils Relative: 1 %
HEMATOCRIT: 39.6 % (ref 35.0–45.0)
Hemoglobin: 12.6 g/dL (ref 11.7–15.5)
LYMPHS PCT: 22 %
Lymphs Abs: 858 cells/uL (ref 850–3900)
MCH: 29.9 pg (ref 27.0–33.0)
MCHC: 31.8 g/dL — ABNORMAL LOW (ref 32.0–36.0)
MCV: 93.8 fL (ref 80.0–100.0)
MONO ABS: 273 {cells}/uL (ref 200–950)
MPV: 9.5 fL (ref 7.5–12.5)
Monocytes Relative: 7 %
NEUTROS ABS: 2691 {cells}/uL (ref 1500–7800)
Neutrophils Relative %: 69 %
PLATELETS: 323 10*3/uL (ref 140–400)
RBC: 4.22 MIL/uL (ref 3.80–5.10)
RDW: 12.5 % (ref 11.0–15.0)
WBC: 3.9 10*3/uL (ref 3.8–10.8)

## 2015-07-04 LAB — LIPID PANEL
Cholesterol: 199 mg/dL (ref 125–200)
HDL: 59 mg/dL (ref >=46)
LDL CALC: 123 mg/dL (ref <130)
Total CHOL/HDL Ratio: 3.4 Ratio (ref <=5.0)
Triglycerides: 85 mg/dL (ref <150)
VLDL: 17 mg/dL (ref <30)

## 2015-07-04 LAB — POCT WET + KOH PREP
Trich by wet prep: ABSENT
YEAST BY KOH: ABSENT
Yeast by wet prep: ABSENT

## 2015-07-04 LAB — COMPREHENSIVE METABOLIC PANEL
ALK PHOS: 50 U/L (ref 33–115)
ALT: 15 U/L (ref 6–29)
AST: 16 U/L (ref 10–30)
Albumin: 4.5 g/dL (ref 3.6–5.1)
BILIRUBIN TOTAL: 0.6 mg/dL (ref 0.2–1.2)
BUN: 12 mg/dL (ref 7–25)
CALCIUM: 9.5 mg/dL (ref 8.6–10.2)
CO2: 27 mmol/L (ref 20–31)
Chloride: 104 mmol/L (ref 98–110)
Creat: 0.54 mg/dL (ref 0.50–1.10)
GLUCOSE: 117 mg/dL — AB (ref 65–99)
Potassium: 4.1 mmol/L (ref 3.5–5.3)
Sodium: 139 mmol/L (ref 135–146)
Total Protein: 7.7 g/dL (ref 6.1–8.1)

## 2015-07-04 LAB — POC MICROSCOPIC URINALYSIS (UMFC): MUCUS RE: ABSENT

## 2015-07-04 LAB — POCT URINALYSIS DIP (MANUAL ENTRY)
BILIRUBIN UA: NEGATIVE
BILIRUBIN UA: NEGATIVE
GLUCOSE UA: NEGATIVE
Nitrite, UA: NEGATIVE
PH UA: 6
Protein Ur, POC: NEGATIVE
Urobilinogen, UA: 0.2

## 2015-07-04 LAB — HIV ANTIBODY (ROUTINE TESTING W REFLEX): HIV: NONREACTIVE

## 2015-07-04 LAB — TSH: TSH: 1.18 mIU/L

## 2015-07-04 LAB — HEMOGLOBIN A1C
Hgb A1c MFr Bld: 5.9 % — ABNORMAL HIGH (ref <5.7)
Mean Plasma Glucose: 123 mg/dL

## 2015-07-04 LAB — POCT URINE PREGNANCY: Preg Test, Ur: NEGATIVE

## 2015-07-04 MED ORDER — CIPROFLOXACIN HCL 500 MG PO TABS: 500.0000 mg | ORAL_TABLET | Freq: Two times a day (BID) | ORAL | Status: DC

## 2015-07-04 NOTE — Progress Notes (Signed)
Subjective:    Patient ID: Cristina Boyd, female    DOB: [birth year], 45 y.o.   MRN: [id number]  07/04/2015  Abdominal Pain and Annual Exam   HPI This 45 y.o. female presents for Complete Physical Examination.  Last physical: Pap smear:  10-09-2012 WNL no HPV obtained.  Regular menses (3 days, moderate flow, mild cramping). IUD. Mammogram:  01-26-2015 TDAP: more than ten years. Influenza:  12-15-2014 Eye exam: scheduled this month.  No contacts or glasses. Dental exam:  Cleaning October 2016.    Lower abdominal pain: onset two weeks; after menses still having pain.  Has IUD; placed five years; good until 2020.  +nausea with pelvic pain.  No vomiting, diarrhea, constipation; no bloody stools.  No dysuria, urgency, frequency, hematuria.  No vaginal discharge.  No pain with intercourse.  Worried about IUD causing pain.   Review of Systems  Constitutional: Negative for fever, chills, diaphoresis, activity change, appetite change, fatigue and unexpected weight change.  HENT: Negative for congestion, dental problem, drooling, ear discharge, ear pain, facial swelling, hearing loss, mouth sores, nosebleeds, postnasal drip, rhinorrhea, sinus pressure, sneezing, sore throat, tinnitus, trouble swallowing and voice change.   Eyes: Negative for photophobia, pain, discharge, redness, itching and visual disturbance.  Respiratory: Negative for apnea, cough, choking, chest tightness, shortness of breath, wheezing and stridor.   Cardiovascular: Negative for chest pain, palpitations and leg swelling.  Gastrointestinal: Positive for nausea. Negative for vomiting, abdominal pain, diarrhea, constipation, blood in stool, abdominal distention, anal bleeding and rectal pain.  Endocrine: Negative for cold intolerance, heat intolerance, polydipsia, polyphagia and polyuria.  Genitourinary: Positive for pelvic pain. Negative for dysuria, urgency, frequency, hematuria, flank pain, decreased urine volume, vaginal bleeding,  vaginal discharge, enuresis, difficulty urinating, genital sores, vaginal pain, menstrual problem and dyspareunia.  Musculoskeletal: Negative for myalgias, back pain, joint swelling, arthralgias, gait problem, neck pain and neck stiffness.  Skin: Negative for color change, pallor, rash and wound.  Allergic/Immunologic: Negative for environmental allergies, food allergies and immunocompromised state.  Neurological: Negative for dizziness, tremors, seizures, syncope, facial asymmetry, speech difficulty, weakness, light-headedness, numbness and headaches.  Hematological: Negative for adenopathy. Does not bruise/bleed easily.  Psychiatric/Behavioral: Negative for suicidal ideas, hallucinations, behavioral problems, confusion, sleep disturbance, self-injury, dysphoric mood, decreased concentration and agitation. The patient is not nervous/anxious and is not hyperactive.     Past Medical History  Diagnosis Date  . Fibroadenoma    History reviewed. No pertinent past surgical history. No Known Allergies  Social History   Social History  . Marital Status: Married    Spouse Name: N/A  . Number of Children: N/A  . Years of Education: N/A   Occupational History  . Not on file.   Social History Main Topics  . Smoking status: Never Smoker   . Smokeless tobacco: Not on file  . Alcohol Use: No  . Drug Use: No  . Sexual Activity: Yes    Birth Control/ Protection: IUD   Other Topics Concern  . 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 Hypertension Mother   . Stroke Mother 75    mild CVA/TIA       Objective:      BP 106/68 mmHg  Pulse 60  Temp(Src) 98.1 F (36.7 C) (Oral)  Resp 16  Ht '5\' 3"'  (1.6 m)  Wt 129 lb (58.514 kg)  BMI 22.86 kg/m2  SpO2 98%  LMP 06/07/2014 Physical Exam  Constitutional: She is oriented to person, place, and time. She appears well-developed and well-nourished. No distress.  HENT:  Head: Normocephalic and atraumatic.  Right Ear: External ear normal.  Left Ear: External ear normal.  Nose: Nose normal.  Mouth/Throat: Oropharynx is clear and moist.  Eyes: Conjunctivae and EOM are normal. Pupils are equal, round, and reactive to light.  Neck: Normal range of motion and full passive range of motion without pain. Neck supple. No JVD present. Carotid bruit is not present. No thyromegaly present.  Cardiovascular: Normal rate, regular rhythm and normal heart sounds.  Exam reveals no gallop and no friction rub.   No murmur heard. Pulmonary/Chest: Effort normal and breath sounds normal. She has no wheezes. She has no rales. Right breast exhibits no inverted nipple, no mass, no nipple discharge, no skin change and no tenderness. Left breast exhibits no inverted nipple, no mass, no nipple discharge, no skin change and no tenderness. Breasts are symmetrical.  Abdominal: Soft. Bowel sounds are normal. She exhibits no distension and no mass. There is no tenderness. There is no rebound and no guarding.  Genitourinary: Rectum normal and uterus normal. No breast swelling, tenderness, discharge or bleeding. There is no rash, tenderness, lesion or injury on the right labia. There is no rash, tenderness, lesion or injury on the left labia. Cervix exhibits friability. Cervix exhibits no motion tenderness and no discharge. Right adnexum displays no mass, no tenderness and no fullness. Left adnexum displays no mass, no tenderness and no fullness. No erythema, tenderness or bleeding in the vagina. No foreign body around the vagina. Vaginal discharge found.  Scant vaginal discharge white yellow   Musculoskeletal:       Right shoulder: Normal.       Left shoulder: Normal.       Cervical back: Normal.  Lymphadenopathy:    She has no cervical adenopathy.  Neurological: She is alert and oriented to person, place, and time. She has normal reflexes. No cranial nerve deficit. She exhibits normal muscle tone. Coordination normal.  Skin: Skin is warm and dry. No rash noted. She is not diaphoretic. No erythema. No pallor.  Psychiatric: She has a normal mood and affect. Her behavior is normal. Judgment and thought content normal.  Nursing note and vitals reviewed.  Results for orders placed or performed in visit on 07/04/15  CBC with Differential/Platelet  Result Value Ref Range   WBC 3.9 3.8 - 10.8 K/uL   RBC 4.22 3.80 - 5.10 MIL/uL   Hemoglobin 12.6 11.7 - 15.5 g/dL   HCT 39.6 35.0 - 45.0 %   MCV 93.8 80.0 - 100.0 fL   MCH 29.9 27.0 - 33.0 pg   MCHC 31.8 (L) 32.0 - 36.0 g/dL   RDW 12.5 11.0 - 15.0 %   Platelets 323 140 - 400 K/uL   MPV 9.5 7.5 - 12.5 fL   Neutro Abs 2691 1500 - 7800 cells/uL   Lymphs Abs 858 850 - 3900 cells/uL   Monocytes Absolute 273 200 - 950 cells/uL   Eosinophils  Absolute 39 15 - 500 cells/uL   Basophils Absolute 39 0 - 200 cells/uL   Neutrophils Relative % 69 %   Lymphocytes Relative 22 %   Monocytes Relative 7 %   Eosinophils Relative 1 %   Basophils Relative 1 %   Smear Review Criteria for review not met   Comprehensive metabolic panel  Result Value Ref Range   Sodium 139 135 - 146 mmol/L   Potassium 4.1 3.5 - 5.3 mmol/L   Chloride 104 98 - 110 mmol/L   CO2 27 20 - 31 mmol/L   Glucose, Bld 117 (H) 65 - 99 mg/dL   BUN 12 7 - 25 mg/dL   Creat 0.54 0.50 - 1.10 mg/dL   Total Bilirubin 0.6 0.2 - 1.2 mg/dL   Alkaline Phosphatase 50 33 - 115 U/L   AST 16 10 - 30 U/L   ALT 15 6 - 29 U/L   Total Protein 7.7 6.1 - 8.1 g/dL   Albumin 4.5 3.6 - 5.1 g/dL   Calcium 9.5 8.6 - 10.2 mg/dL  Hemoglobin A1c  Result Value Ref Range   Hgb A1c MFr Bld  <5.7 %    Mean Plasma Glucose  mg/dL  Lipid panel  Result Value Ref Range   Cholesterol 199 125 - 200 mg/dL   Triglycerides 85 <150 mg/dL   HDL 59 >=46 mg/dL   Total CHOL/HDL Ratio 3.4 <=5.0 Ratio   VLDL 17 <30 mg/dL   LDL Cholesterol 123 <130 mg/dL  TSH  Result Value Ref Range   TSH  0.40 - 4.50 mIU/L  HIV antibody  Result Value Ref Range   HIV 1&2 Ab, 4th Generation  NONREACTIVE  POCT urinalysis dipstick  Result Value Ref Range   Color, UA light yellow (A) yellow   Clarity, UA hazy (A) clear   Glucose, UA negative negative   Bilirubin, UA negative negative   Ketones, POC UA negative negative   Spec Grav, UA <=1.005    Blood, UA trace-lysed (A) negative   pH, UA 6.0    Protein Ur, POC negative negative   Urobilinogen, UA 0.2    Nitrite, UA Negative Negative   Leukocytes, UA Trace (A) Negative  POCT urine pregnancy  Result Value Ref Range   Preg Test, Ur Negative Negative  POCT Microscopic Urinalysis ([facility])  Result Value Ref Range   WBC,UR,HPF,POC Few (A) None WBC/hpf   RBC,UR,HPF,POC Few (A) None RBC/hpf   Bacteria None None, Too numerous to count   Mucus Absent Absent   Epithelial Cells, UR Per Microscopy Few (A) None, Too numerous to count cells/hpf  POCT Wet + KOH Prep  Result Value Ref Range   Yeast by KOH Absent Present, Absent   Yeast by wet prep Absent Present, Absent   WBC by wet prep Moderate (A) None, Few, Too numerous to count   Clue Cells Wet Prep HPF POC None None, Too numerous to count   Trich by wet prep Absent Present, Absent   Bacteria Wet Prep HPF POC Few None, Few, Too numerous to count   Epithelial Cells By [employer] Pref ([facility]) Few None, Few, Too numerous to count   RBC,UR,HPF,POC None None RBC/hpf       Assessment & Plan:   1. Routine physical examination   2. Encounter for routine gynecological examination   3. Cervical cancer screening   4. Screening, lipid   5. Screening for diabetes mellitus   6. Screening for HIV (human immunodeficiency virus)    7.  Need for Tdap vaccination   8. Pelvic pain in female   9. Screening for thyroid disorder    -anticipatory guidance --- exercise, weight maintenance. -pap smear obtained; mammogram UTD. -s/p TDAP. -3 servings of dairy recommended. -obtain age appropriate labs. -rx for Cipro provided for cervicitis by clinical exam. If no improvement in pelvic pain with treatment of cervicitis, refer to gynecology.   Orders Placed This Encounter  Procedures  . Tdap vaccine greater than or equal to 7yo IM  . CBC with Differential/Platelet  . Comprehensive metabolic panel    Order Specific Question:  Has the patient fasted?    Answer:  Yes  . Hemoglobin A1c  . Lipid panel    Order Specific Question:  Has the patient fasted?    Answer:  Yes  . TSH  . HIV antibody  . POCT urinalysis dipstick  . POCT urine pregnancy  . POCT Microscopic Urinalysis ([facility])  . POCT Wet + KOH Prep   Meds ordered this encounter  Medications  . ciprofloxacin (CIPRO) 500 MG tablet    Sig: Take 1 tablet (500 mg total) by mouth 2 (two) times daily.    Dispense:  14 tablet    Refill:  0    No Follow-up on file.    Rosevelt Luu Elayne Guerin, M.D. [facility] [address] [place], [place]  [address] [phone] phone [phone] fax

## 2015-07-04 NOTE — Patient Instructions (Addendum)
   IF you received an x-ray today, you will receive an invoice from [facility]. Please contact [facility] at [phone] with questions or concerns regarding your invoice.   IF you received labwork today, you will receive an invoice from [facility]. Please contact [facility] at [phone] with questions or concerns regarding your invoice.   Our billing staff will not be able to assist you with questions regarding bills from these companies.  You will be contacted with the lab results as soon as they are available. The fastest way to get your results is to activate your My Chart account. Instructions are located on the last page of this paperwork. If you have not heard from us regarding the results in 2 weeks, please contact this office.     Keeping You Healthy  Get These Tests 1. Blood Pressure- Have your blood pressure checked once a year by your health care provider.  Normal blood pressure is 120/80. 2. Weight- Have your body mass index (BMI) calculated to screen for obesity.  BMI is measure of body fat based on height and weight.  You can also calculate your own BMI at www.nhlbisupport.com/bmi/. 3. Cholesterol- Have your cholesterol checked every 5 years starting at age 20 then yearly starting at age 45. 4. Chlamydia, HIV, and other sexually transmitted diseases- Get screened every year until age 25, then within three months of each new sexual provider. 5. Pap Test - Every 1-5 years; discuss with your health care provider. 6. Mammogram- Every 1-2 years starting at age 40--50  Take these medicines  Calcium with Vitamin D-Your body needs 1200 mg of Calcium each day and 800-1000 IU of Vitamin D daily.  Your body can only absorb 500 mg of Calcium at a time so Calcium must be taken in 2 or 3 divided doses throughout the day.  Multivitamin with folic acid- Once daily if it is possible for you to become pregnant.  Get these  Immunizations  Gardasil-Series of three doses; prevents HPV related illness such as genital warts and cervical cancer.  Menactra-Single dose; prevents meningitis.  Tetanus shot- Every 10 years.  Flu shot-Every year.  Take these steps 1. Do not smoke-Your healthcare provider can help you quit.  For tips on how to quit go to www.smokefree.gov or call 1-800 QUITNOW. 2. Be physically active- Exercise 5 days a week for at least 30 minutes.  If you are not already physically active, start slow and gradually work up to 30 minutes of moderate physical activity.  Examples of moderate activity include walking briskly, dancing, swimming, bicycling, etc. 3. Breast Cancer- A self breast exam every month is important for early detection of breast cancer.  For more information and instruction on self breast exams, ask your healthcare provider or www.womenshealth.gov/faq/breast-self-exam.cfm. 4. Eat a healthy diet- Eat a variety of healthy foods such as fruits, vegetables, whole grains, low fat milk, low fat cheeses, yogurt, lean meats, poultry and fish, beans, nuts, tofu, etc.  For more information go to www. Thenutritionsource.org 5. Drink alcohol in moderation- Limit alcohol intake to one drink or less per day. Never drink and drive. 6. Depression- Your emotional health is as important as your physical health.  If you're feeling down or losing interest in things you normally enjoy please talk to your healthcare provider about being screened for depression. 7. Dental visit- Brush and floss your teeth twice daily; visit your dentist twice a year. 8. Eye doctor- Get an eye exam at least every   2 years. 9. Helmet use- Always wear a helmet when riding a bicycle, motorcycle, rollerblading or skateboarding. 10. Safe sex- If you may be exposed to sexually transmitted infections, use a condom. 11. Seat belts- Seat belts can save your live; always wear one. 12. Smoke/Carbon Monoxide detectors- These detectors need to  be installed on the appropriate level of your home. Replace batteries at least once a year. 13. Skin cancer- When out in the sun please cover up and use sunscreen 15 SPF or higher. 14. Violence- If anyone is threatening or hurting you, please tell your healthcare provider.        

## 2015-07-05 LAB — PAP IG, CT-NG NAA, HPV HIGH-RISK
CHLAMYDIA PROBE AMP: NOT DETECTED
GC PROBE AMP: NOT DETECTED
HPV DNA High Risk: NOT DETECTED

## 2015-07-09 ENCOUNTER — Encounter: Payer: Self-pay | Admitting: Family Medicine

## 2015-11-09 ENCOUNTER — Encounter: Payer: Self-pay | Admitting: Physician Assistant

## 2015-11-09 ENCOUNTER — Ambulatory Visit (INDEPENDENT_AMBULATORY_CARE_PROVIDER_SITE_OTHER): Payer: BLUE CROSS/BLUE SHIELD | Admitting: Physician Assistant

## 2015-11-09 VITALS — BP 100/60 | HR 68 | Temp 98.2°F | Resp 16 | Ht 63.0 in | Wt 129.4 lb

## 2015-11-09 DIAGNOSIS — R7309 Other abnormal glucose: Secondary | ICD-10-CM

## 2015-11-09 MED ORDER — GLUCOSE BLOOD VI STRP: ORAL_STRIP | 12 refills | Status: DC

## 2015-11-09 NOTE — Progress Notes (Signed)
   Cristina Boyd  MRN: [id number] DOB: [birth year]  Subjective:  Pt presents to clinic with concerns that she may have diabetes.  She went to [facility] on 8/20 because she is having increased thirst at night and waking up in the middle of the night not sleeping well over the last 2 weeks.  At that visit her A1C was 5.7.   Since then she has changed from white rice to brown rice, she has started to exercise about 30 mins in the morning. She drinks only water and hot unsweet tea.  She eats a small bowl of rice with each meal and then veggies and protein.  She typically does not end up eating in the middle of the day while at work because she does not have time.  She does not eat sweets.  Review of Systems  Endocrine: Positive for polydipsia. Negative for polyphagia and polyuria.    Patient Active Problem List   Diagnosis Date Noted  . Other malaise and fatigue 11/20/2012    No current outpatient prescriptions on file prior to visit.   No current facility-administered medications on file prior to visit.     No Known Allergies  Pt patients past, family and social history were reviewed and updated.  Objective:  BP 100/60 (BP Location: Right Arm, Patient Position: Sitting, Cuff Size: Normal)   Pulse 68   Temp 98.2 F (36.8 C) (Oral)   Resp 16   Ht 5\' 3"  (1.6 m)   Wt 129 lb 6.4 oz (58.7 kg)   LMP 10/24/2015 (Exact Date)   SpO2 100%   BMI 22.92 kg/m   Physical Exam  Constitutional: She is oriented to person, place, and time and well-developed, well-nourished, and in no distress.  HENT:  Head: Normocephalic and atraumatic.  Right Ear: Hearing and external ear normal.  Left Ear: Hearing and external ear normal.  Eyes: Conjunctivae are normal.  Neck: Normal range of motion.  Cardiovascular: Normal rate, regular rhythm and normal heart sounds.   No murmur heard. Pulmonary/Chest: Effort normal and breath sounds normal. She has no wheezes.  Neurological: She is alert and oriented to  person, place, and time. Gait normal.  Skin: Skin is warm and dry.  Psychiatric: Mood, memory, affect and judgment normal.  Vitals reviewed.   Assessment and Plan :  Elevated glucose - Plan: glucose blood (BAYER CONTOUR NEXT TEST) test strip  Reviewed labs with patient - we discussed changes to make to her diet and to continue with the changes she has already made in the last 2 weeks.  She will recheck in 3 months - she will monitor her glucose readings once a day - fasting and then 2 hours post-prandial to allow [place] to see if there is a certain time of day that there is a problem.  Her questions were answered and she agrees with the plan.  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  Windell Hummingbird PA-C  [facility] 11/09/2015 9:42 AM

## 2015-11-09 NOTE — Patient Instructions (Addendum)
  Check your sugar once a day but at different times -- fasting or 2 hours after a meal and keep track - please make sure to mark which ones are fasting and which ones are 2 hours after eating.  Continue exercise in the morning - increase your speed as you feel comfortable  For eating - continue good water intake, unsweet tea is ok  Try to decrease your carbohydrates rice and breads are the most common ones - increase your veggies and continue your good proteins  Try to eat minimal sugary foods   IF you received an x-ray today, you will receive an invoice from [facility]. Please contact [facility] at [phone] with questions or concerns regarding your invoice.   IF you received labwork today, you will receive an invoice from [facility]. Please contact [facility] at [phone] with questions or concerns regarding your invoice.   Our billing staff will not be able to assist you with questions regarding bills from these companies.  You will be contacted with the lab results as soon as they are available. The fastest way to get your results is to activate your My Chart account. Instructions are located on the last page of this paperwork. If you have not heard from [place] regarding the results in 2 weeks, please contact this office.

## 2015-11-11 ENCOUNTER — Ambulatory Visit (INDEPENDENT_AMBULATORY_CARE_PROVIDER_SITE_OTHER): Payer: BLUE CROSS/BLUE SHIELD | Admitting: Family Medicine

## 2015-11-11 VITALS — BP 120/76 | HR 75 | Temp 98.5°F | Resp 16 | Ht 62.25 in | Wt 127.0 lb

## 2015-11-11 DIAGNOSIS — R3589 Other polyuria: Secondary | ICD-10-CM

## 2015-11-11 DIAGNOSIS — R739 Hyperglycemia, unspecified: Secondary | ICD-10-CM

## 2015-11-11 DIAGNOSIS — R358 Other polyuria: Secondary | ICD-10-CM | POA: Diagnosis not present

## 2015-11-11 LAB — GLUCOSE, POCT (MANUAL RESULT ENTRY): POC GLUCOSE: 113 mg/dL — AB (ref 70–99)

## 2015-11-11 LAB — POC MICROSCOPIC URINALYSIS (UMFC): MUCUS RE: ABSENT

## 2015-11-11 LAB — POCT GLYCOSYLATED HEMOGLOBIN (HGB A1C): Hemoglobin A1C: 5.7

## 2015-11-11 MED ORDER — CEPHALEXIN 500 MG PO CAPS: 500.0000 mg | ORAL_CAPSULE | Freq: Two times a day (BID) | ORAL | 0 refills | Status: DC

## 2015-11-11 NOTE — Progress Notes (Signed)
Cristina Boyd is a 45 y.o. female who presents to Urgent Care today for hyperglycemia:    1.  Hyperglycemia:  Patient seen here 9/3 for concerns for hyperglycemia and general malaise.  She was seen before that on 8/20 at [facility] urgent care for concerns for high blood sugar because of polydipsia and difficulty sleeping.  States that CBG at that visit was 123 non-fasting and A1C was 5.7.  Since then, she has made several dietary changes.  Switched to brown rice, only drinking black tea and water.  Mostly otherwise just eats vegetables and protein.  Doesn't eat sweets or any other sugary foods.  For past several days, she's been having polyuria ("every 15 minutes") as well as nocturia.  Her fasting CBG yesterday was 97 and 115 after breakfast.  This AM, she had CBG fasting of 180.  This prompted her to come in and be seen as she was worried she has diabetes.    Has noted some blood in her urine.  Some chills as well.  No fevers.  No blurry vision or vision changes.  No back pain.  Inconsistent dysuria.    No family history of diabetes.  Does exercise daily and yoga/deep breathing in the evening.    ROS as above.    PMH reviewed. Patient is a nonsmoker.   Past Medical History:  Diagnosis Date  . Fibroadenoma    No past surgical history on file.  Medications reviewed. Current Outpatient Prescriptions  Medication Sig Dispense Refill  . glucose blood (BAYER CONTOUR NEXT TEST) test strip Use as instructed 100 each 12   No current facility-administered medications for this visit.      Physical Exam:  BP 120/76 (BP Location: Right Arm, Patient Position: Sitting, Cuff Size: Normal)   Pulse 75   Temp 98.5 F (36.9 C) (Oral)   Resp 16   Ht 5' 2.25" (1.581 m)   Wt 127 lb (57.6 kg)   LMP 10/24/2015   SpO2 98%   BMI 23.04 kg/m  Gen:  Alert, cooperative patient who appears stated age in no acute distress.  Vital signs reviewed. HEENT: EOMI,  MMM Pulm:  Clear to auscultation bilaterally with good  air movement.  No wheezes or rales noted.   Cardiac:  Regular rate and rhythm  Abd:  Soft/nondistended/nontender.  No CVA tenderness Neuro:  No sensory deficits BL LE"s.  Exts: Non edematous BL LE's  Results for orders placed or performed in visit on 11/11/15  POCT glucose (manual entry)  Result Value Ref Range   POC Glucose 113 (A) 70 - 99 mg/dl  POCT Microscopic Urinalysis ([facility])  Result Value Ref Range   WBC,UR,HPF,POC None None WBC/hpf   RBC,UR,HPF,POC Few (A) None RBC/hpf   Bacteria Few (A) None, Too numerous to count   Mucus Absent Absent   Epithelial Cells, UR Per Microscopy Few (A) None, Too numerous to count cells/hpf  POCT glycosylated hemoglobin (Hb A1C)  Result Value Ref Range   Hemoglobin A1C 5.7      Assessment and Plan:  1.  UTI: - positive symptoms plus visualized blood in urine at home plus hematuria here - no evidence of nephrolithiasis.  No back pain.   - Treat with Keflex.  Warning precautions provided.  2.  Hyperglyecemia:   - LIkely secondary to UTI.  Transitory this AM.  Her CBG here was 113 despite having eaten this AM, which is reassuring. - A1C is normal.   - reassurance provided.  FU in 3 months  as previously discussed last visit.

## 2015-11-11 NOTE — Patient Instructions (Addendum)
You have a UTI.  I think this is what is making you feel bad and having to go to the bathroom so much.  Take the Keflex twice daily for the next 7 days.  Your A1C was 5.7 and your sugar was 113.  This is good.  Keep doing the diet changes you have been.  You do not have diabetes.  If you're not feeling better after these 7 days, come back and see [place].  If you're feeling worse with fevers, chills, or other concerns, make sure to come back sooner.      IF you received an x-ray today, you will receive an invoice from [facility]. Please contact [facility] at [phone] with questions or concerns regarding your invoice.   IF you received labwork today, you will receive an invoice from [facility]. Please contact [facility] at [phone] with questions or concerns regarding your invoice.   Our billing staff will not be able to assist you with questions regarding bills from these companies.  You will be contacted with the lab results as soon as they are available. The fastest way to get your results is to activate your My Chart account. Instructions are located on the last page of this paperwork. If you have not heard from [place] regarding the results in 2 weeks, please contact this office.

## 2015-11-11 NOTE — Addendum Note (Signed)
Addended byMingo Amber, Gunhild Bautch H on: 11/11/2015 11:55 AM   Modules accepted: Orders

## 2015-11-12 LAB — URINE CULTURE

## 2015-12-06 ENCOUNTER — Telehealth: Payer: Self-pay

## 2015-12-06 NOTE — Telephone Encounter (Signed)
Daughter advised per ov note if symptoms do not improve or worsen she needs to be re seen, daughter will have mom call for an appointment this week.

## 2015-12-06 NOTE — Telephone Encounter (Signed)
Daughter called on behalf of Mother Cristina Boyd. Daughter stated her mother finished the antibiotic and her symptoms are still the same. Patient request for another antibiotic. Frequent urination and burning.    Please call daughter Fernande Boyden at [phone].

## 2016-08-01 ENCOUNTER — Encounter: Payer: Self-pay | Admitting: Physician Assistant

## 2016-08-01 ENCOUNTER — Ambulatory Visit (INDEPENDENT_AMBULATORY_CARE_PROVIDER_SITE_OTHER): Payer: BLUE CROSS/BLUE SHIELD | Admitting: Physician Assistant

## 2016-08-01 VITALS — BP 107/69 | HR 70 | Temp 98.6°F | Resp 16 | Ht 62.5 in | Wt 121.2 lb

## 2016-08-01 DIAGNOSIS — Z0000 Encounter for general adult medical examination without abnormal findings: Secondary | ICD-10-CM

## 2016-08-01 DIAGNOSIS — Z1329 Encounter for screening for other suspected endocrine disorder: Secondary | ICD-10-CM

## 2016-08-01 DIAGNOSIS — Z30432 Encounter for removal of intrauterine contraceptive device: Secondary | ICD-10-CM | POA: Diagnosis not present

## 2016-08-01 DIAGNOSIS — Z130 Encounter for screening for diseases of the blood and blood-forming organs and certain disorders involving the immune mechanism: Secondary | ICD-10-CM | POA: Diagnosis not present

## 2016-08-01 DIAGNOSIS — Z131 Encounter for screening for diabetes mellitus: Secondary | ICD-10-CM

## 2016-08-01 DIAGNOSIS — N939 Abnormal uterine and vaginal bleeding, unspecified: Secondary | ICD-10-CM | POA: Diagnosis not present

## 2016-08-01 DIAGNOSIS — Z13220 Encounter for screening for lipoid disorders: Secondary | ICD-10-CM

## 2016-08-01 DIAGNOSIS — Z13228 Encounter for screening for other metabolic disorders: Secondary | ICD-10-CM

## 2016-08-01 LAB — POCT WET + KOH PREP
Trich by wet prep: ABSENT
Yeast by KOH: ABSENT
Yeast by wet prep: ABSENT

## 2016-08-01 NOTE — Patient Instructions (Addendum)
   IF you received an x-ray today, you will receive an invoice from [facility]. Please contact [facility] at [phone] with questions or concerns regarding your invoice.   IF you received labwork today, you will receive an invoice from [facility]. Please contact [facility] at [phone] with questions or concerns regarding your invoice.   Our billing staff will not be able to assist you with questions regarding bills from these companies.  You will be contacted with the lab results as soon as they are available. The fastest way to get your results is to activate your My Chart account. Instructions are located on the last page of this paperwork. If you have not heard from us regarding the results in 2 weeks, please contact this office.    Keeping You Healthy  Get These Tests 1. Blood Pressure- Have your blood pressure checked once a year by your health care provider.  Normal blood pressure is 120/80. 2. Weight- Have your body mass index (BMI) calculated to screen for obesity.  BMI is measure of body fat based on height and weight.  You can also calculate your own BMI at www.nhlbisupport.com/bmi/. 3. Cholesterol- Have your cholesterol checked every 5 years starting at age 20 then yearly starting at age 45. 4. Chlamydia, HIV, and other sexually transmitted diseases- Get screened every year until age 25, then within three months of each new sexual provider. 5. Pap Test - Every 1-5 years; discuss with your health care provider. 6. Mammogram- Every 1-2 years starting at age 40--50  Take these medicines  Calcium with Vitamin D-Your body needs 1200 mg of Calcium each day and 800-1000 IU of Vitamin D daily.  Your body can only absorb 500 mg of Calcium at a time so Calcium must be taken in 2 or 3 divided doses throughout the day.  Multivitamin with folic acid- Once daily if it is possible for you to become pregnant.  Get these Immunizations  Gardasil-Series of three doses;  prevents HPV related illness such as genital warts and cervical cancer.  Menactra-Single dose; prevents meningitis.  Tetanus shot- Every 10 years.  Flu shot-Every year.  Take these steps 1. Do not smoke-Your healthcare provider can help you quit.  For tips on how to quit go to www.smokefree.gov or call 1-800 QUITNOW. 2. Be physically active- Exercise 5 days a week for at least 30 minutes.  If you are not already physically active, start slow and gradually work up to 30 minutes of moderate physical activity.  Examples of moderate activity include walking briskly, dancing, swimming, bicycling, etc. 3. Breast Cancer- A self breast exam every month is important for early detection of breast cancer.  For more information and instruction on self breast exams, ask your healthcare provider or www.womenshealth.gov/faq/breast-self-exam.cfm. 4. Eat a healthy diet- Eat a variety of healthy foods such as fruits, vegetables, whole grains, low fat milk, low fat cheeses, yogurt, lean meats, poultry and fish, beans, nuts, tofu, etc.  For more information go to www. Thenutritionsource.org 5. Drink alcohol in moderation- Limit alcohol intake to one drink or less per day. Never drink and drive. 6. Depression- Your emotional health is as important as your physical health.  If you're feeling down or losing interest in things you normally enjoy please talk to your healthcare provider about being screened for depression. 7. Dental visit- Brush and floss your teeth twice daily; visit your dentist twice a year. 8. Eye doctor- Get an eye exam at least every 2 years. 9. Helmet   use- Always wear a helmet when riding a bicycle, motorcycle, rollerblading or skateboarding. 10. Safe sex- If you may be exposed to sexually transmitted infections, use a condom. 11. Seat belts- Seat belts can save your live; always wear one. 12. Smoke/Carbon Monoxide detectors- These detectors need to be installed on the appropriate level of your  home. Replace batteries at least once a year. 13. Skin cancer- When out in the sun please cover up and use sunscreen 15 SPF or higher. 14. Violence- If anyone is threatening or hurting you, please tell your healthcare provider.        

## 2016-08-01 NOTE — Progress Notes (Signed)
PRIMARY CARE AT [facility] [address] [place] [id number] [phone]  Date:  08/01/2016   Name:  Cristina Boyd   DOB:  1970/12/31   MRN:  [id number]  PCP:  Patient, No Pcp Per    History of Present Illness:  Cristina Boyd is a 46 y.o. female patient who presents to PCP with  Chief Complaint  Patient presents with  . Annual Exam    No pap     DIET: she gets fresh vegetables in daily.  Eats all vegetables.  Not a lot of fast food.  She cooks most of her meals.  Rare caffeine.  Water intake: 16 oz per day  BM: QMORNING.  No blood or black stool.  No constipation  URINATION: no frequency, no dysuria, no hematuria  SLEEP: 7 hours per night  MENSES: a lot of pain,   Menses:  No family hx of blood clotting or hx of personal clots.  Requesting to have IUD removed.  She has also noticed abnormal metallic smell.  She recalls that the same thing occurred with the paragard the last time.  She has had this placed for about 7 years from what she recalls.  Sexually active with one partner.  No abnormal vaginal discharge.  Abdominal pain minimal.    Patient Active Problem List   Diagnosis Date Noted  . Other malaise and fatigue 11/20/2012    Past Medical History:  Diagnosis Date  . Fibroadenoma     No past surgical history on file.  Social History  Substance Use Topics  . Smoking status: Never Smoker  . Smokeless tobacco: Never Used  . Alcohol use No    Family History  Problem Relation Age of Onset  . Hypertension Mother   . Stroke Mother 29       mild CVA/TIA    No Known Allergies  Medication list has been reviewed and updated.  Current Outpatient Prescriptions on File Prior to Visit  Medication Sig Dispense Refill  . glucose blood (BAYER CONTOUR NEXT TEST) test strip Use as instructed (Patient not taking: Reported on 08/01/2016) 100 each 12   No current facility-administered medications on file prior to visit.     Review of Systems  Constitutional: Negative for chills  and fever.  HENT: Negative for ear discharge, ear pain and sore throat (itchy throat).   Eyes: Negative for blurred vision and double vision.  Respiratory: Negative for cough, shortness of breath and wheezing.   Cardiovascular: Negative for chest pain, palpitations and leg swelling.  Gastrointestinal: Negative for blood in stool, constipation, diarrhea, nausea and vomiting.  Genitourinary: Negative for dysuria, frequency and hematuria.  Skin: Negative for itching and rash.  Neurological: Negative for dizziness and headaches.   ROS otherwise unremarkable unless listed above.  Physical Examination: BP 107/69   Pulse 70   Temp 98.6 F (37 C) (Oral)   Resp 16   Ht 5' 2.5" (1.588 m)   Wt 121 lb 3.2 oz (55 kg)   LMP 07/16/2016   SpO2 100%   BMI 21.81 kg/m  Ideal Body Weight: Weight in (lb) to have BMI = 25: 138.6  Physical Exam  Constitutional: She is oriented to person, place, and time. She appears well-developed and well-nourished. No distress.  HENT:  Head: Normocephalic and atraumatic.  Right Ear: Tympanic membrane, external ear and ear canal normal.  Left Ear: Tympanic membrane, external ear and ear canal normal.  Nose: Right sinus exhibits no maxillary sinus tenderness and no frontal sinus  tenderness. Left sinus exhibits no maxillary sinus tenderness and no frontal sinus tenderness.  Mouth/Throat: Oropharynx is clear and moist. No uvula swelling. No oropharyngeal exudate, posterior oropharyngeal edema or posterior oropharyngeal erythema.  Eyes: Conjunctivae and EOM are normal. Pupils are equal, round, and reactive to light.  Neck: Normal range of motion. Neck supple. No thyromegaly present.  Cardiovascular: Normal rate, regular rhythm, normal heart sounds and intact distal pulses.  Exam reveals no gallop, no distant heart sounds and no friction rub.   No murmur heard. Pulmonary/Chest: Effort normal and breath sounds normal. No respiratory distress. She has no decreased breath  sounds. She has no wheezes. She has no rhonchi.  Abdominal: Soft. Bowel sounds are normal. She exhibits no distension and no mass. There is no tenderness.  Genitourinary: Uterus normal. Pelvic exam was performed with patient supine. There is no rash on the right labia. There is no rash on the left labia. Cervix exhibits discharge. Cervix exhibits no motion tenderness and no friability. Right adnexum displays no mass and no tenderness. Left adnexum displays no mass and no tenderness. There is bleeding in the vagina.  Genitourinary Comments: Some bleeding at the cervical os with minimal discharge  Musculoskeletal: Normal range of motion. She exhibits no edema or tenderness.  Lymphadenopathy:       Head (right side): No submandibular, no tonsillar, no preauricular and no posterior auricular adenopathy present.       Head (left side): No submandibular, no tonsillar, no preauricular and no posterior auricular adenopathy present.    She has no cervical adenopathy.  Neurological: She is alert and oriented to person, place, and time. No cranial nerve deficit. She exhibits normal muscle tone. Coordination normal.  Skin: Skin is warm and dry. She is not diaphoretic.  Psychiatric: She has a normal mood and affect. Her behavior is normal.   IUD removed without difficulty.    Assessment and Plan: Cristina Boyd is a 46 y.o. female who is here today for cc of annual physical exam and request to remove the IUD.   wetprep not concerning.  She will return if her bleeding does not stop. Annual physical exam - Plan: CBC, CMP14+EGFR, TSH, Lipid panel, Hemoglobin A1c, POCT Wet + KOH Prep  Screening for deficiency anemia - Plan: CBC  Screening for metabolic disorder - Plan: CMP14+EGFR  Screening for thyroid disorder - Plan: TSH  Screening for lipid disorders - Plan: Lipid panel  Screening for diabetes mellitus - Plan: Hemoglobin A1c  Abnormal vaginal bleeding - Plan: POCT Wet + KOH Prep  Ivar Drape,  PA-C [facility] 5/29/201810:42 AM

## 2016-08-02 LAB — CMP14+EGFR
A/G RATIO: 1.5 (ref 1.2–2.2)
ALT: 19 IU/L (ref 0–32)
AST: 18 IU/L (ref 0–40)
Albumin: 4.4 g/dL (ref 3.5–5.5)
Alkaline Phosphatase: 56 IU/L (ref 39–117)
BUN/Creatinine Ratio: 18 (ref 9–23)
BUN: 11 mg/dL (ref 6–24)
Bilirubin Total: 0.4 mg/dL (ref 0.0–1.2)
CALCIUM: 9.4 mg/dL (ref 8.7–10.2)
CO2: 25 mmol/L (ref 18–29)
CREATININE: 0.61 mg/dL (ref 0.57–1.00)
Chloride: 102 mmol/L (ref 96–106)
GFR, EST AFRICAN AMERICAN: 127 mL/min/{1.73_m2} (ref >59)
GFR, EST NON AFRICAN AMERICAN: 110 mL/min/{1.73_m2} (ref >59)
GLOBULIN, TOTAL: 3 g/dL (ref 1.5–4.5)
Glucose: 99 mg/dL (ref 65–99)
POTASSIUM: 4.2 mmol/L (ref 3.5–5.2)
SODIUM: 140 mmol/L (ref 134–144)
TOTAL PROTEIN: 7.4 g/dL (ref 6.0–8.5)

## 2016-08-02 LAB — CBC
HEMOGLOBIN: 12.7 g/dL (ref 11.1–15.9)
Hematocrit: 40.3 % (ref 34.0–46.6)
MCH: 29.6 pg (ref 26.6–33.0)
MCHC: 31.5 g/dL (ref 31.5–35.7)
MCV: 94 fL (ref 79–97)
Platelets: 298 10*3/uL (ref 150–379)
RBC: 4.29 x10E6/uL (ref 3.77–5.28)
RDW: 12.5 % (ref 12.3–15.4)
WBC: 4.1 10*3/uL (ref 3.4–10.8)

## 2016-08-02 LAB — HEMOGLOBIN A1C
ESTIMATED AVERAGE GLUCOSE: 108 mg/dL
HEMOGLOBIN A1C: 5.4 % (ref 4.8–5.6)

## 2016-08-02 LAB — TSH: TSH: 1.39 u[IU]/mL (ref 0.450–4.500)

## 2016-08-02 LAB — LIPID PANEL
CHOL/HDL RATIO: 2.9 ratio (ref 0.0–4.4)
Cholesterol, Total: 180 mg/dL (ref 100–199)
HDL: 62 mg/dL (ref >39)
LDL CALC: 96 mg/dL (ref 0–99)
TRIGLYCERIDES: 111 mg/dL (ref 0–149)
VLDL CHOLESTEROL CAL: 22 mg/dL (ref 5–40)

## 2016-08-04 ENCOUNTER — Telehealth: Payer: Self-pay | Admitting: Physician Assistant

## 2016-08-04 NOTE — Telephone Encounter (Addendum)
PATIENT'S DAUGHTER (MAI) STATES SOME ONE TRIED TO CALL HER ON THURS. (08/03/16) REGARDING HER MOTHER. SHE HAD HER PHYSICAL DONE WITH STEPHANIE ENGLISH ON Tuesday. THE MESSAGE SAID SOMETHING ABOUT HER NEEDING AN ANTIBOTIC FOR A UTI? HAS THE MEDICINE BEEN CALLED INTO HER PHARMACY YET? BEST PHONE [phone] (DAUGHTER'S NAME IS MAI VU AND SHE IS ON HER MOTHER'S 2018 HIPAA) PHARMACY CHOICE IS [facility] ON [address]. [place]

## 2016-08-05 NOTE — Telephone Encounter (Signed)
I dont see anything written about labs?

## 2016-08-05 NOTE — Telephone Encounter (Signed)
No concern of a uti.  Not sure what was said.  Removed iud.  She opted for no birth control.  Advised to follow up if the bleeding continued.

## 2016-08-05 NOTE — Telephone Encounter (Signed)
All labs normal but please see the wet prep.  Daughter advised no urine run/infection

## 2016-08-06 NOTE — Telephone Encounter (Signed)
The wet prep was not telling.  She had bleeding from the cervical os.  I reviewed this at the visit.  She was bleeding, and multiple epithelial cells which can blur the test.  She should return if she continues to have bleeding, or exhibiting other symptoms (abdominal pain, fever, etc.).

## 2016-08-07 NOTE — Telephone Encounter (Signed)
Pt daughter advised.

## 2016-08-15 ENCOUNTER — Telehealth: Payer: Self-pay | Admitting: Physician Assistant

## 2016-08-15 NOTE — Telephone Encounter (Signed)
PATIENT'S DAUGHTER (MAI) STATES HER MOTHER GOT HER IUD REMOVED A FEW WEEKS AGO. SHE IS NOT BLEEDING, BUT SHE FEELS VERY BLOATED. SHE ALSO WANTS TO KNOW IF SHE NEEDS TO GET AN ANTIBIOTIC BECAUSE IN MY-CHART IT SHOWS HER WBC's WERE ELEVATED AND THAT SHE MAY HAVE SOME KIND OF BACTERIA? BEST PHONE [phone] (CELL) DAUGHTER'S NAME IS MAI AND SHE IS ON HER MOTHER'S 2018 HIPAA. PHARMACY CHOICE IS [facility] ON [address]. [place]

## 2016-08-16 NOTE — Telephone Encounter (Signed)
Again lots of epithelial cells, so the wbc would be there.  It did not look like bv or yeast.  I recommend her return.  Per note, 08/06/2016.

## 2016-08-22 ENCOUNTER — Encounter: Payer: Self-pay | Admitting: Physician Assistant

## 2016-08-22 ENCOUNTER — Ambulatory Visit (INDEPENDENT_AMBULATORY_CARE_PROVIDER_SITE_OTHER): Payer: BLUE CROSS/BLUE SHIELD | Admitting: Physician Assistant

## 2016-08-22 VITALS — BP 112/73 | HR 75 | Temp 98.3°F | Resp 18 | Ht 62.99 in | Wt 123.6 lb

## 2016-08-22 DIAGNOSIS — W57XXXA Bitten or stung by nonvenomous insect and other nonvenomous arthropods, initial encounter: Secondary | ICD-10-CM

## 2016-08-22 DIAGNOSIS — L0390 Cellulitis, unspecified: Secondary | ICD-10-CM

## 2016-08-22 MED ORDER — CEPHALEXIN 500 MG PO CAPS: 500.0000 mg | ORAL_CAPSULE | Freq: Three times a day (TID) | ORAL | 0 refills | Status: DC

## 2016-08-22 MED ORDER — HYDROXYZINE HCL 25 MG PO TABS: ORAL_TABLET | ORAL | 0 refills | Status: DC

## 2016-08-22 NOTE — Telephone Encounter (Signed)
LVM for daughter to C/B if they still had concerns, pt was seen today for a unrelated issue.

## 2016-08-22 NOTE — Progress Notes (Signed)
Cristina Boyd  MRN: [id number] DOB: 03/10/1970  Subjective:   Cristina Boyd is a 46 y.o. female who presents for evaluation of a bug bite involving the face.Was out walking 4 days and felt something fly by and land on her face. She swatted it away.  She then developed this surrounding redness and warmth. Has some pruritis, discomfort, and mild swelling. Patient denies: eye pain, visual disturbance, abdominal pain, decrease in appetite, fever, sore throat, and vomting. She has used zyrtec and advil with mild relief. No allergies that she is aware of. No new exposure to plants, foods, lotions, or soaps.   Review of Systems  Per HPI  Patient Active Problem List   Diagnosis Date Noted  . Other malaise and fatigue 11/20/2012    Current Outpatient Prescriptions on File Prior to Visit  Medication Sig Dispense Refill  . glucose blood (BAYER CONTOUR NEXT TEST) test strip Use as instructed (Patient not taking: Reported on 08/01/2016) 100 each 12   No current facility-administered medications on file prior to visit.     No Known Allergies   Objective:  BP 112/73 (BP Location: Right Arm, Patient Position: Sitting, Cuff Size: Normal)   Pulse 75   Temp 98.3 F (36.8 C) (Oral)   Resp 18   Ht 5' 2.99" (1.6 m)   Wt 123 lb 9.6 oz (56.1 kg)   LMP 08/03/2016 (Approximate)   SpO2 99%   BMI 21.90 kg/m   Physical Exam  Constitutional: She is oriented to person, place, and time and well-developed, well-nourished, and in no distress.  HENT:  Head: Normocephalic and atraumatic.  Right Ear: Tympanic membrane, external ear and ear canal normal.  Left Ear: Tympanic membrane, external ear and ear canal normal.  Nose: Nose normal.  Mouth/Throat: Uvula is midline, oropharynx is clear and moist and mucous membranes are normal.  Eyes: Conjunctivae and EOM are normal. Pupils are equal, round, and reactive to light. Right conjunctiva is not injected. Left conjunctiva is not injected.    No pain with palpation of  orbits bilaterally.  No orbital swelling noted.   Neck: Normal range of motion.  Pulmonary/Chest: Effort normal.  Lymphadenopathy:       Head (right side): No submental, no submandibular, no tonsillar, no preauricular, no posterior auricular and no occipital adenopathy present.       Head (left side): Posterior auricular adenopathy present. No submental, no submandibular, no tonsillar, no preauricular and no occipital adenopathy present.    She has cervical adenopathy.       Right cervical: No superficial cervical, no deep cervical and no posterior cervical adenopathy present.      Left cervical: Superficial cervical adenopathy present. No deep cervical and no posterior cervical adenopathy present.       Right: No supraclavicular adenopathy present.       Left: No supraclavicular adenopathy present.  Neurological: She is alert and oriented to person, place, and time. Gait normal.  Skin: Skin is warm and dry.  Psychiatric: Affect normal.  Vitals reviewed.   Assessment and Plan :  1. Insect bite, initial encounter 2. Cellulitis, unspecified cellulitis site Will treat overlying cellulitis with antibiotic. Will treat itching with hydroxyzine. Pt encouraged to apply ice to affected area 4-5 x a day for 20 minutes at a time. Return to clinic if symptoms worsen, do not improve in 7-10 days, or as needed - hydrOXYzine (ATARAX/VISTARIL) 25 MG tablet; Take 0.5-1 pill at bedtime prn for itching  Dispense: 30 tablet; Refill: 0 -  cephALEXin (KEFLEX) 500 MG capsule; Take 1 capsule (500 mg total) by mouth 3 (three) times daily.  Dispense: 21 capsule; Refill: 0  Tenna Delaine, PA-C  Primary Care at [facility] 08/22/2016 11:38 AM

## 2016-08-22 NOTE — Patient Instructions (Addendum)
We are going to treat the surrounding redness for cellulitis with an antibiotic called keflex. Please take with food as prescribed. You can also use hydroxyzine for itching. Apply ice to the affected area 4-5 x a day for the next 2 days. Do not put directly on the face, use a towel between the skin and the ice. You should noticed much improvement over the next 24-48 hours. Please return to clinic if symptoms worsen, do not improve, or as needed   Insect Bite, Adult An insect bite can make your skin red, itchy, and swollen. Some insects can spread disease to people with a bite. However, most insect bites do not lead to disease, and most are not serious. Follow these instructions at home: Bite area care  Do not scratch the bite area.  Keep the bite area clean and dry.  Wash the bite area every day with soap and water as told by your doctor.  Check the bite area every day for signs of infection. Check for: ? More redness, swelling, or pain. ? Fluid or blood. ? Warmth. ? Pus. Managing pain, itching, and swelling  You may put any of these on the bite area as told by your doctor: ? A baking soda paste. ? Cortisone cream. ? Calamine lotion.  If directed, put ice on the bite area. ? Put ice in a plastic bag. ? Place a towel between your skin and the bag. ? Leave the ice on for 20 minutes, 2-3 times a day. Medicines  Take medicines or put medicines on your skin only as told by your doctor.  If you were prescribed an antibiotic medicine, use it as told by your doctor. Do not stop using the antibiotic even if your condition improves. General instructions  Keep all follow-up visits as told by your doctor. This is important. How is this prevented? To help you have a lower risk of insect bites:  When you are outside, wear clothing that covers your arms and legs.  Use insect repellent. The best insect repellents have: ? An active ingredient of DEET, picaridin, oil of lemon eucalyptus  (OLE), or IR3535. ? Higher amounts of DEET or another active ingredient than other repellents have.  If your home windows do not have screens, think about putting some in.  Contact a doctor if:  You have more redness, swelling, or pain in the bite area.  You have fluid, blood, or pus coming from the bite area.  The bite area feels warm.  You have a fever. Get help right away if:  You have joint pain.  You have a rash.  You have shortness of breath.  You feel more tired or sleepy than you normally do.  You have neck pain.  You have a headache.  You feel weaker than you normally do.  You have chest pain.  You have pain in your belly.  You feel sick to your stomach (nauseous) or you throw up (vomit). Summary  An insect bite can make your skin red, itchy, and swollen.  Do not scratch the bite area, and keep it clean and dry.  Ice can help with pain and itching from the bite. This information is not intended to replace advice given to you by your health care provider. Make sure you discuss any questions you have with your health care provider. Document Released: 02/18/2000 Document Revised: 09/23/2015 Document Reviewed: 07/08/2014 Elsevier Interactive Patient Education  2018 [employer].   Cellulitis, Adult Cellulitis is a skin infection.  The infected area is usually red and sore. This condition occurs most often in the arms and lower legs. It is very important to get treated for this condition. Follow these instructions at home:  Take over-the-counter and prescription medicines only as told by your doctor.  If you were prescribed an antibiotic medicine, take it as told by your doctor. Do not stop taking the antibiotic even if you start to feel better.  Drink enough fluid to keep your pee (urine) clear or pale yellow.  Do not touch or rub the infected area.  Raise (elevate) the infected area above the level of your heart while you are sitting or lying  down.  Place warm or cold wet cloths (warm or cold compresses) on the infected area. Do this as told by your doctor.  Keep all follow-up visits as told by your doctor. This is important. These visits let your doctor make sure your infection is not getting worse. Contact a doctor if:  You have a fever.  Your symptoms do not get better after 1-2 days of treatment.  Your bone or joint under the infected area starts to hurt after the skin has healed.  Your infection comes back. This can happen in the same area or another area.  You have a swollen bump in the infected area.  You have new symptoms.  You feel ill and also have muscle aches and pains. Get help right away if:  Your symptoms get worse.  You feel very sleepy.  You throw up (vomit) or have watery poop (diarrhea) for a long time.  There are red streaks coming from the infected area.  Your red area gets larger.  Your red area turns darker. This information is not intended to replace advice given to you by your health care provider. Make sure you discuss any questions you have with your health care provider. Document Released: 08/09/2007 Document Revised: 07/29/2015 Document Reviewed: 12/30/2014 Elsevier Interactive Patient Education  2018 [employer].  IF you received an x-ray today, you will receive an invoice from [facility]. Please contact [facility] at [phone] with questions or concerns regarding your invoice.   IF you received labwork today, you will receive an invoice from [facility]. Please contact [facility] at [phone] with questions or concerns regarding your invoice.   Our billing staff will not be able to assist you with questions regarding bills from these companies.  You will be contacted with the lab results as soon as they are available. The fastest way to get your results is to activate your My Chart account. Instructions are located on the last page of this paperwork. If you  have not heard from [place] regarding the results in 2 weeks, please contact this office.

## 2016-11-13 ENCOUNTER — Encounter: Payer: Self-pay | Admitting: Physician Assistant

## 2016-11-13 ENCOUNTER — Ambulatory Visit (INDEPENDENT_AMBULATORY_CARE_PROVIDER_SITE_OTHER): Payer: BLUE CROSS/BLUE SHIELD | Admitting: Physician Assistant

## 2016-11-13 VITALS — BP 102/65 | HR 68 | Temp 98.7°F | Resp 16 | Ht 62.99 in | Wt 123.2 lb

## 2016-11-13 DIAGNOSIS — K640 First degree hemorrhoids: Secondary | ICD-10-CM

## 2016-11-13 MED ORDER — HYDROCORTISONE 2.5 % RE CREA: 1.0000 "application " | TOPICAL_CREAM | Freq: Two times a day (BID) | RECTAL | 0 refills | Status: DC

## 2016-11-13 MED ORDER — HYDROCORTISONE ACETATE 25 MG RE SUPP: 25.0000 mg | Freq: Two times a day (BID) | RECTAL | 0 refills | Status: DC

## 2016-11-13 NOTE — Progress Notes (Signed)
    11/13/2016 9:07 AM   DOB: 05/13/1970 / MRN: [id number]  SUBJECTIVE:  Cristina Boyd is a 46 y.o. female presenting for a rectal problem that began last Saturday.  She sits chronically for work.  No drinking enough water and not really getting any fiber.   She has No Known Allergies.   She  has a past medical history of Fibroadenoma.    She  reports that she has never smoked. She has never used smokeless tobacco. She reports that she does not drink alcohol or use drugs. She  reports that she currently engages in sexual activity. She reports using the following method of birth control/protection: IUD. The patient  has no past surgical history on file.  Her family history includes Hypertension in her mother; Stroke (age of onset: 78) in her mother.  Review of Systems  Constitutional: Negative for fever.  Respiratory: Negative for cough.   Gastrointestinal: Negative for constipation, nausea and vomiting.  Skin: Negative for rash.  Neurological: Negative for dizziness.    The problem list and medications were reviewed and updated by myself where necessary and exist elsewhere in the encounter.   OBJECTIVE:  BP 102/65 (BP Location: Right Arm, Patient Position: Sitting, Cuff Size: Normal)   Pulse 68   Temp 98.7 F (37.1 C) (Oral)   Resp 16   Ht 5' 2.99" (1.6 m)   Wt 123 lb 3.2 oz (55.9 kg)   SpO2 98%   BMI 21.83 kg/m   Physical Exam  Constitutional: She is active.  Non-toxic appearance.  Cardiovascular: Normal rate, S1 normal and S2 normal.  Exam reveals no decreased pulses.   Pulmonary/Chest: Effort normal. No tachypnea.  Musculoskeletal: She exhibits no edema.  Neurological: She is alert.  Skin: Skin is warm and dry. She is not diaphoretic. No pallor.    No results found for this or any previous visit (from the past 72 hour(s)).  No results found.  ASSESSMENT AND PLAN:  Cristina Boyd was seen today for hemorrhoids.  Diagnoses and all orders for this visit:  Grade I hemorrhoids:    -     hydrocortisone (ANUSOL-HC) 2.5 % rectal cream; Place 1 application rectally 2 (two) times daily. -     hydrocortisone (ANUSOL-HC) 25 MG suppository; Place 1 suppository (25 mg total) rectally 2 (two) times daily.    The patient is advised to call or return to clinic if she does not see an improvement in symptoms, or to seek the care of the closest emergency department if she worsens with the above plan.   Philis Fendt, MHS, PA-C [facility] at [facility] 11/13/2016 9:07 AM

## 2016-11-13 NOTE — Patient Instructions (Signed)
Use the cream when your symptoms are minimal.  Use the suppository if you symptoms get bad.  Come back to the office you have excessive bleeding or pain about you rectum.

## 2016-12-21 ENCOUNTER — Ambulatory Visit (INDEPENDENT_AMBULATORY_CARE_PROVIDER_SITE_OTHER): Payer: BLUE CROSS/BLUE SHIELD | Admitting: Family Medicine

## 2016-12-21 ENCOUNTER — Encounter: Payer: Self-pay | Admitting: Family Medicine

## 2016-12-21 ENCOUNTER — Ambulatory Visit (INDEPENDENT_AMBULATORY_CARE_PROVIDER_SITE_OTHER): Payer: BLUE CROSS/BLUE SHIELD

## 2016-12-21 VITALS — BP 110/70 | HR 69 | Temp 99.0°F | Resp 16 | Ht 63.78 in | Wt 120.0 lb

## 2016-12-21 DIAGNOSIS — N644 Mastodynia: Secondary | ICD-10-CM

## 2016-12-21 DIAGNOSIS — R509 Fever, unspecified: Secondary | ICD-10-CM | POA: Diagnosis not present

## 2016-12-21 DIAGNOSIS — R739 Hyperglycemia, unspecified: Secondary | ICD-10-CM

## 2016-12-21 DIAGNOSIS — R590 Localized enlarged lymph nodes: Secondary | ICD-10-CM | POA: Diagnosis not present

## 2016-12-21 DIAGNOSIS — R079 Chest pain, unspecified: Secondary | ICD-10-CM

## 2016-12-21 LAB — POCT URINE PREGNANCY: PREG TEST UR: NEGATIVE

## 2016-12-21 LAB — POCT URINALYSIS DIP (MANUAL ENTRY)
BILIRUBIN UA: NEGATIVE
BILIRUBIN UA: NEGATIVE mg/dL
Glucose, UA: NEGATIVE mg/dL
Leukocytes, UA: NEGATIVE
Nitrite, UA: NEGATIVE
PH UA: 7 (ref 5.0–8.0)
PROTEIN UA: NEGATIVE mg/dL
RBC UA: NEGATIVE
SPEC GRAV UA: 1.01 (ref 1.010–1.025)
Urobilinogen, UA: 0.2 E.U./dL

## 2016-12-21 LAB — POCT GLYCOSYLATED HEMOGLOBIN (HGB A1C): HEMOGLOBIN A1C: 5.6

## 2016-12-21 NOTE — Patient Instructions (Signed)
     IF you received an x-ray today, you will receive an invoice from [facility]. Please contact [facility] at [phone] with questions or concerns regarding your invoice.   IF you received labwork today, you will receive an invoice from [facility]. Please contact [facility] at [phone] with questions or concerns regarding your invoice.   Our billing staff will not be able to assist you with questions regarding bills from these companies.  You will be contacted with the lab results as soon as they are available. The fastest way to get your results is to activate your My Chart account. Instructions are located on the last page of this paperwork. If you have not heard from us regarding the results in 2 weeks, please contact this office.     

## 2016-12-21 NOTE — Progress Notes (Signed)
Subjective:    Patient ID: Cristina Boyd, female    DOB: 09/16/1970, 46 y.o.   MRN: [id number]  12/21/2016  Fever (with chills x 2 days ) and Adenopathy (x 2 days  with some chest tightness )    HPI This 46 y.o. female presents for swollen lymph nodes, fever, chest tightness.   Onset two days ago.  R sided chest tightness.  +chills.  L anterior cervical LAD and supraclavicular swelling intermittent for several months.  No ear pain.  No sore throat.  No rhinorrhea; no nasal congestion.  No coughing.  No sweats.  No n/v/d.  No recent travel.  No sick contacts.  Works at [employer].  No SOB.  No gum or teeth pain.  No scalp irritation. No exposure to tuberculosis.  Evaluated in June 2018 for insect bite Left lateral face; took abx as prescribed; L posterior lymph nodes swollen at that time and resolved.  Now recurrent. Also reports intermittent L posterior cervical LAD with menses.  Checked blood sugar in evening this week due to malaise and sugar 231.  Then checked the following morning fasting, 113.  Aunt with DMII.  Presents with a very specific list of tests that would like ordered; daughter has done research and requesting specific testing.  Having R sided chest pain; thinks in the breast; does not feel deep.    BP Readings from Last 3 Encounters:  12/24/16 101/67  12/21/16 110/70  11/13/16 102/65   Wt Readings from Last 3 Encounters:  12/24/16 120 lb (54.4 kg)  12/21/16 120 lb (54.4 kg)  11/13/16 123 lb 3.2 oz (55.9 kg)   Immunization History  Administered Date(s) Administered  . Influenza,inj,Quad PF,6+ Mos 12/15/2014, 12/15/2014  . Tdap 07/04/2015    Review of Systems  Constitutional: Negative for chills, diaphoresis, fatigue and fever.  HENT: Negative for congestion, dental problem, drooling, ear discharge, ear pain, facial swelling, hearing loss, mouth sores, nosebleeds, postnasal drip, rhinorrhea, sinus pain, sinus pressure, sneezing, sore throat, tinnitus, trouble  swallowing and voice change.   Eyes: Negative for visual disturbance.  Respiratory: Negative for cough, choking, shortness of breath, wheezing and stridor.   Cardiovascular: Positive for chest pain. Negative for palpitations and leg swelling.  Gastrointestinal: Negative for abdominal distention, abdominal pain, anal bleeding, blood in stool, constipation, diarrhea, nausea and vomiting.  Endocrine: Negative for cold intolerance, heat intolerance, polydipsia, polyphagia and polyuria.  Genitourinary: Negative for dysuria.  Musculoskeletal: Negative for arthralgias, back pain, gait problem, joint swelling and myalgias.  Skin: Negative for color change and rash.  Neurological: Negative for dizziness, tremors, seizures, syncope, facial asymmetry, speech difficulty, weakness, light-headedness, numbness and headaches.  Psychiatric/Behavioral: Negative for dysphoric mood. The patient is not nervous/anxious.     Past Medical History:  Diagnosis Date  . Fibroadenoma    History reviewed. No pertinent surgical history. No Known Allergies Current Outpatient Prescriptions on File Prior to Visit  Medication Sig Dispense Refill  . glucose blood ([facility] CONTOUR NEXT TEST) test strip Use as instructed 100 each 12  . hydrocortisone (ANUSOL-HC) 2.5 % rectal cream Place 1 application rectally 2 (two) times daily. (Patient not taking: Reported on 12/24/2016) 30 g 0  . hydrocortisone (ANUSOL-HC) 25 MG suppository Place 1 suppository (25 mg total) rectally 2 (two) times daily. (Patient not taking: Reported on 12/24/2016) 12 suppository 0   No current facility-administered medications on file prior to visit.    Social History   Social History  . Marital status: Married  Spouse name: N/A  . Number of children: N/A  . Years of education: N/A   Occupational History  . Not on file.   Social History Main Topics  . Smoking status: Never Smoker  . Smokeless tobacco: Never Used  . Alcohol use No  . Drug  use: No  . Sexual activity: Yes    Birth control/ protection: IUD   Other Topics Concern  . 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 Hypertension Mother   . Stroke Mother 24       mild CVA/TIA       Objective:    BP 110/70   Pulse 69   Temp 99 F (37.2 C) (Oral)   Resp 16   Ht 5' 3.78" (1.62 m)   Wt 120 lb (54.4 kg)   LMP  (LMP Unknown)   SpO2 98%   BMI 20.74 kg/m  Physical Exam  Constitutional: She is oriented to person, place, and time. She appears well-developed and well-nourished. No distress.  HENT:  Head: Normocephalic and atraumatic.  Right Ear: External ear normal.  Left Ear: External ear normal.  Nose: Nose normal.  Mouth/Throat: Oropharynx is clear and moist.  Eyes: Pupils are equal, round, and reactive to light. Conjunctivae and EOM are normal.  Neck: Normal range of motion. Neck supple. Carotid bruit is not present. No thyromegaly present.  Cardiovascular: Normal rate, regular rhythm, normal heart sounds and intact distal pulses.  Exam reveals no gallop and no friction rub.   No murmur heard. Pulmonary/Chest: Effort normal and breath sounds normal. She has no wheezes. She has no rales. Right breast exhibits no inverted nipple, no mass, no nipple discharge, no skin change and no tenderness. Left breast exhibits no inverted nipple, no mass, no nipple discharge, no skin change and no tenderness. Breasts are symmetrical.  Abdominal: Soft. Bowel sounds are normal. She exhibits no distension and no mass. There is no tenderness. There is no rebound and no guarding.    Lymphadenopathy:       Head (right side): Occipital adenopathy present. No submental, no submandibular, no tonsillar, no preauricular and no posterior auricular adenopathy present.       Head (left side): Occipital adenopathy present. No submental, no submandibular, no tonsillar, no preauricular and no posterior auricular adenopathy present.    She has cervical adenopathy.       Right cervical: Deep cervical and posterior cervical adenopathy present. No superficial cervical adenopathy present.      Left cervical: Deep cervical and posterior cervical adenopathy present. No superficial cervical adenopathy present.       Right axillary: No pectoral adenopathy present.       Left axillary: No pectoral adenopathy present.      Right: No inguinal, no supraclavicular and no epitrochlear adenopathy present.       Left: No inguinal, no supraclavicular and no epitrochlear adenopathy present.  Neurological: She is alert and oriented to  person, place, and time. No cranial nerve deficit.  Skin: Skin is warm and dry. No rash noted. She is not diaphoretic. No erythema. No pallor.  Psychiatric: She has a normal mood and affect. Her behavior is normal.   No results found. Depression screen [facility] 2/9 12/21/2016 08/22/2016 08/01/2016 11/11/2015 11/09/2015  Decreased Interest 0 0 0 0 0  Down, Depressed, Hopeless 0 0 0 0 0  PHQ - 2 Score 0 0 0 0 0  Altered sleeping - - - - -  Tired, decreased energy - - - - -  Change in appetite - - - - -  Feeling bad or failure about yourself  - - - - -  Trouble concentrating - - - - -  Moving slowly or fidgety/restless - - - - -  Suicidal thoughts - - - - -  PHQ-9 Score - - - - -   Fall Risk  12/21/2016 08/22/2016 08/01/2016 11/11/2015 11/09/2015  Falls in the past year? No No No No No        Assessment & Plan:   1. Fever and chills   2. Posterior cervical lymphadenopathy   3. Hyperglycemia   4. Right-sided chest pain   5. Breast pain, right   6. LAD (lymphadenopathy),  supraclavicular     -New onset fever/chills with intermittent posterior cervical LAD and possibly supraclavicular LAD yet none on exam today.  -obtain labs and CXR. Obtain Tb Gold. -refer for diagnostic mammogram and breast US.   Orders Placed This Encounter  Procedures  . Culture, Group A Strep    Order Specific Question:   Source    Answer:   oropharynx  . DG Chest 2 View    Standing Status:   Future    Number of Occurrences:   1    Standing Expiration Date:   12/21/2017    Order Specific Question:   Reason for Exam (SYMPTOM  OR DIAGNOSIS REQUIRED)    Answer:   R sided chest pain with fever and L posterior cervical LAD and B supraclavicular LAD    Order Specific Question:   Is the patient pregnant?    Answer:   No    Order Specific Question:   Preferred imaging location?    Answer:   External  . MM DIAG BREAST TOMO BILATERAL    Standing Status:   Future    Standing Expiration Date:   03/04/2018    Order Specific Question:   Reason for Exam (SYMPTOM  OR DIAGNOSIS REQUIRED)    Answer:   R breast pain    Order Specific Question:   Is the patient pregnant?    Answer:   No    Order Specific Question:   Preferred imaging location?    Answer:   [facility]  . US BREAST COMPLETE UNI RIGHT INC AXILLA    Standing Status:   Future    Standing Expiration Date:   03/04/2018    Order Specific Question:   Reason for Exam (SYMPTOM  OR DIAGNOSIS REQUIRED)    Answer:   R breast pain    Order Specific Question:   Preferred imaging location?    Answer:   [facility]  . CBC with Differential/Platelet  . Comprehensive metabolic panel  . Quantiferon tb gold assay (blood)  . Sedimentation Rate  . Epstein-Barr virus VCA antibody panel  . CMV IgM  . HIV antibody  . QuantiFERON-TB Gold Plus  . Specimen status report  . POCT urine pregnancy  .  POCT glycosylated hemoglobin (Hb A1C)  . POCT urinalysis dipstick   No orders of the defined types were placed in this  encounter.   Return in about 2 weeks (around 01/04/2017) for recheck.   Lache Dagher Elayne Guerin, M.D. Primary Care at [facility] [address] [place], [place]  [address] [phone] phone [phone] fax

## 2016-12-22 LAB — CBC WITH DIFFERENTIAL/PLATELET
BASOS: 0 %
Basophils Absolute: 0 10*3/uL (ref 0.0–0.2)
EOS (ABSOLUTE): 0 10*3/uL (ref 0.0–0.4)
EOS: 0 %
HEMATOCRIT: 41.8 % (ref 34.0–46.6)
HEMOGLOBIN: 13.8 g/dL (ref 11.1–15.9)
IMMATURE GRANS (ABS): 0 10*3/uL (ref 0.0–0.1)
IMMATURE GRANULOCYTES: 0 %
Lymphocytes Absolute: 1.2 10*3/uL (ref 0.7–3.1)
Lymphs: 19 %
MCH: 30.3 pg (ref 26.6–33.0)
MCHC: 33 g/dL (ref 31.5–35.7)
MCV: 92 fL (ref 79–97)
MONOCYTES: 5 %
MONOS ABS: 0.3 10*3/uL (ref 0.1–0.9)
NEUTROS PCT: 76 %
Neutrophils Absolute: 5.1 10*3/uL (ref 1.4–7.0)
Platelets: 309 10*3/uL (ref 150–379)
RBC: 4.55 x10E6/uL (ref 3.77–5.28)
RDW: 12.7 % (ref 12.3–15.4)
WBC: 6.7 10*3/uL (ref 3.4–10.8)

## 2016-12-22 LAB — CMV IGM: CMV IgM Ser EIA-aCnc: <30 AU/mL (ref 0.0–29.9)

## 2016-12-22 LAB — COMPREHENSIVE METABOLIC PANEL
A/G RATIO: 1.4 (ref 1.2–2.2)
ALBUMIN: 5 g/dL (ref 3.5–5.5)
ALT: 20 IU/L (ref 0–32)
AST: 21 IU/L (ref 0–40)
Alkaline Phosphatase: 59 IU/L (ref 39–117)
BUN/Creatinine Ratio: 15 (ref 9–23)
BUN: 8 mg/dL (ref 6–24)
Bilirubin Total: 0.6 mg/dL (ref 0.0–1.2)
CALCIUM: 10.1 mg/dL (ref 8.7–10.2)
CHLORIDE: 100 mmol/L (ref 96–106)
CO2: 24 mmol/L (ref 20–29)
CREATININE: 0.55 mg/dL — AB (ref 0.57–1.00)
GFR calc Af Amer: 131 mL/min/{1.73_m2} (ref >59)
GFR, EST NON AFRICAN AMERICAN: 114 mL/min/{1.73_m2} (ref >59)
GLOBULIN, TOTAL: 3.7 g/dL (ref 1.5–4.5)
Glucose: 107 mg/dL — ABNORMAL HIGH (ref 65–99)
Potassium: 4 mmol/L (ref 3.5–5.2)
SODIUM: 140 mmol/L (ref 134–144)
TOTAL PROTEIN: 8.7 g/dL — AB (ref 6.0–8.5)

## 2016-12-22 LAB — EPSTEIN-BARR VIRUS VCA ANTIBODY PANEL
EBV Early Antigen Ab, IgG: <9 U/mL (ref 0.0–8.9)
EBV NA IGG: 172 U/mL — AB (ref 0.0–17.9)
EBV VCA IgG: 39 U/mL — ABNORMAL HIGH (ref 0.0–17.9)

## 2016-12-22 LAB — HIV ANTIBODY (ROUTINE TESTING W REFLEX): HIV Screen 4th Generation wRfx: NONREACTIVE

## 2016-12-22 LAB — SEDIMENTATION RATE: SED RATE: 6 mm/h (ref 0–32)

## 2016-12-24 ENCOUNTER — Emergency Department (HOSPITAL_COMMUNITY)
Admission: EM | Admit: 2016-12-24 | Discharge: 2016-12-24 | Disposition: A | Discharge status: Home / Self Care | Payer: BLUE CROSS/BLUE SHIELD | Attending: Emergency Medicine | Admitting: Emergency Medicine

## 2016-12-24 ENCOUNTER — Encounter (HOSPITAL_COMMUNITY): Payer: Self-pay | Admitting: Emergency Medicine

## 2016-12-24 ENCOUNTER — Emergency Department (HOSPITAL_COMMUNITY): Payer: BLUE CROSS/BLUE SHIELD

## 2016-12-24 DIAGNOSIS — A084 Viral intestinal infection, unspecified: Secondary | ICD-10-CM | POA: Diagnosis not present

## 2016-12-24 DIAGNOSIS — R1010 Upper abdominal pain, unspecified: Secondary | ICD-10-CM

## 2016-12-24 DIAGNOSIS — R110 Nausea: Secondary | ICD-10-CM | POA: Diagnosis not present

## 2016-12-24 DIAGNOSIS — R6883 Chills (without fever): Secondary | ICD-10-CM

## 2016-12-24 DIAGNOSIS — R0602 Shortness of breath: Secondary | ICD-10-CM | POA: Insufficient documentation

## 2016-12-24 DIAGNOSIS — R1013 Epigastric pain: Secondary | ICD-10-CM | POA: Insufficient documentation

## 2016-12-24 DIAGNOSIS — R000 Tachycardia, unspecified: Secondary | ICD-10-CM | POA: Diagnosis not present

## 2016-12-24 LAB — COMPREHENSIVE METABOLIC PANEL
ALK PHOS: 49 U/L (ref 38–126)
ALT: 17 U/L (ref 14–54)
AST: 20 U/L (ref 15–41)
Albumin: 4.2 g/dL (ref 3.5–5.0)
Anion gap: 11 (ref 5–15)
BILIRUBIN TOTAL: 1.2 mg/dL (ref 0.3–1.2)
BUN: 8 mg/dL (ref 6–20)
CALCIUM: 9.1 mg/dL (ref 8.9–10.3)
CO2: 23 mmol/L (ref 22–32)
CREATININE: 0.67 mg/dL (ref 0.44–1.00)
Chloride: 102 mmol/L (ref 101–111)
GFR calc Af Amer: >60 mL/min (ref >60)
Glucose, Bld: 145 mg/dL — ABNORMAL HIGH (ref 65–99)
Potassium: 3.4 mmol/L — ABNORMAL LOW (ref 3.5–5.1)
SODIUM: 136 mmol/L (ref 135–145)
TOTAL PROTEIN: 8.1 g/dL (ref 6.5–8.1)

## 2016-12-24 LAB — CBC
HCT: 40 % (ref 36.0–46.0)
Hemoglobin: 13.4 g/dL (ref 12.0–15.0)
MCH: 30.8 pg (ref 26.0–34.0)
MCHC: 33.5 g/dL (ref 30.0–36.0)
MCV: 92 fL (ref 78.0–100.0)
PLATELETS: 272 10*3/uL (ref 150–400)
RBC: 4.35 MIL/uL (ref 3.87–5.11)
RDW: 12.2 % (ref 11.5–15.5)
WBC: 6 10*3/uL (ref 4.0–10.5)

## 2016-12-24 LAB — URINALYSIS, ROUTINE W REFLEX MICROSCOPIC
BILIRUBIN URINE: NEGATIVE
Glucose, UA: NEGATIVE mg/dL
HGB URINE DIPSTICK: NEGATIVE
Ketones, ur: 5 mg/dL — AB
Leukocytes, UA: NEGATIVE
Nitrite: NEGATIVE
PH: 8 (ref 5.0–8.0)
Protein, ur: NEGATIVE mg/dL
SPECIFIC GRAVITY, URINE: 1.008 (ref 1.005–1.030)

## 2016-12-24 LAB — CULTURE, GROUP A STREP: STREP A CULTURE: NEGATIVE

## 2016-12-24 LAB — I-STAT TROPONIN, ED: Troponin i, poc: 0 ng/mL (ref 0.00–0.08)

## 2016-12-24 LAB — POC URINE PREG, ED: Preg Test, Ur: NEGATIVE

## 2016-12-24 LAB — LIPASE, BLOOD: LIPASE: 20 U/L (ref 11–51)

## 2016-12-24 LAB — RAPID STREP SCREEN (MED CTR MEBANE ONLY): Streptococcus, Group A Screen (Direct): NEGATIVE

## 2016-12-24 LAB — MONONUCLEOSIS SCREEN: Mono Screen: NEGATIVE

## 2016-12-24 LAB — CBG MONITORING, ED: GLUCOSE-CAPILLARY: 136 mg/dL — AB (ref 65–99)

## 2016-12-24 MED ORDER — IOPAMIDOL (ISOVUE-300) INJECTION 61%
INTRAVENOUS | Status: AC
  Administered 2016-12-24: 100 mL
  Filled 2016-12-24: qty 100

## 2016-12-24 MED ORDER — DICYCLOMINE HCL 20 MG PO TABS: 20.0000 mg | ORAL_TABLET | Freq: Two times a day (BID) | ORAL | 0 refills | Status: DC | PRN

## 2016-12-24 MED ORDER — METOCLOPRAMIDE HCL 10 MG PO TABS: 10.0000 mg | ORAL_TABLET | Freq: Four times a day (QID) | ORAL | 0 refills | Status: DC | PRN

## 2016-12-24 MED ORDER — GI COCKTAIL ~~LOC~~
30.0000 mL | Freq: Once | ORAL | Status: AC
  Administered 2016-12-24: 30 mL via ORAL
  Filled 2016-12-24: qty 30

## 2016-12-24 MED ORDER — SODIUM CHLORIDE 0.9 % IV BOLUS (SEPSIS)
1000.0000 mL | Freq: Once | INTRAVENOUS | Status: AC
  Administered 2016-12-24: 1000 mL via INTRAVENOUS

## 2016-12-24 MED ORDER — DICYCLOMINE HCL 10 MG PO CAPS
10.0000 mg | ORAL_CAPSULE | Freq: Once | ORAL | Status: AC
  Administered 2016-12-24: 10 mg via ORAL
  Filled 2016-12-24: qty 1

## 2016-12-24 MED ORDER — FAMOTIDINE IN NACL 20-0.9 MG/50ML-% IV SOLN
20.0000 mg | Freq: Once | INTRAVENOUS | Status: AC
  Administered 2016-12-24: 20 mg via INTRAVENOUS
  Filled 2016-12-24: qty 50

## 2016-12-24 MED ORDER — ONDANSETRON HCL 4 MG/2ML IJ SOLN
4.0000 mg | Freq: Once | INTRAMUSCULAR | Status: AC
  Administered 2016-12-24: 4 mg via INTRAVENOUS
  Filled 2016-12-24: qty 2

## 2016-12-24 MED ORDER — KETOROLAC TROMETHAMINE 30 MG/ML IJ SOLN
30.0000 mg | Freq: Once | INTRAMUSCULAR | Status: AC
  Administered 2016-12-24: 30 mg via INTRAVENOUS
  Filled 2016-12-24: qty 1

## 2016-12-24 NOTE — ED Provider Notes (Signed)
[facility] EMERGENCY DEPARTMENT Provider Note   CSN: [id number] Arrival date & time: 12/24/16  0604     History   Chief Complaint Chief Complaint  Patient presents with  . Abdominal Pain  . Tachycardia    HPI Cristina Boyd is a 46 y.o. female who presented with chills, abdominal pain, sore throat, neck swelling.  Patient has subjective chills for the last 4-5 days.  Patient did not take temperature at home.  Patient actually saw her primary care doctor 3 days ago and had extensive workup including normal CBC, CMP, EBV titers showing past but no active infection, negative rapid strep, normal ESR, normal chest x-ray.  Patient states that she felt better 2 days ago and then yesterday night had some worsening epigastric pain and subjective shortness of breath.  Patient states that she felt nauseated and the pain goes from her epigastrium up to her chest area.  Denies any pleuritic chest pain.  She had some subjective chills again but did not take her temperature again.  She denies any urinary symptoms or actual vomiting.  She has persistent sore throat as well but denies trouble swallowing.   The history is provided by the patient.    Past Medical History:  Diagnosis Date  . Fibroadenoma     Patient Active Problem List   Diagnosis Date Noted  . Other malaise and fatigue 11/20/2012    History reviewed. No pertinent surgical history.  OB History    No data available       Home Medications    Prior to Admission medications   Medication Sig Start Date End Date Taking? Authorizing Provider  cephALEXin (KEFLEX) 500 MG capsule Take 1 capsule (500 mg total) by mouth 3 (three) times daily. Patient not taking: Reported on 12/24/2016 08/22/16   Tenna Delaine D, PA-C  glucose blood [facility] CONTOUR NEXT TEST) test strip Use as instructed 11/09/15   Gale Journey, Damaris Hippo, PA-C  hydrocortisone (ANUSOL-HC) 2.5 % rectal cream Place 1 application rectally 2 (two) times  daily. Patient not taking: Reported on 12/24/2016 11/13/16   Tereasa Coop, PA-C  hydrocortisone (ANUSOL-HC) 25 MG suppository Place 1 suppository (25 mg total) rectally 2 (two) times daily. Patient not taking: Reported on 12/24/2016 11/13/16   Tereasa Coop, PA-C  hydrOXYzine (ATARAX/VISTARIL) 25 MG tablet Take 0.5-1 pill at bedtime prn for itching Patient not taking: Reported on 12/24/2016 08/22/16   Leonie Douglas, PA-C    Family History Family History  Problem Relation Age of Onset  . Hypertension Mother   . Stroke Mother 37       mild CVA/TIA    Social History Social History  Substance Use Topics  . Smoking status: Never Smoker  . Smokeless tobacco: Never Used  . Alcohol use No     Allergies   Patient has no known allergies.   Review of Systems Review of Systems  Constitutional: Positive for chills.  Gastrointestinal: Positive for abdominal pain and nausea.  All other systems reviewed and are negative.    Physical Exam Updated Vital Signs BP 101/67   Pulse 82   Temp 97.8 F (36.6 C) (Oral)   Resp 15   Ht 5' 3.75" (1.619 m)   Wt 54.4 kg (120 lb)   LMP  (LMP Unknown)   SpO2 99%   BMI 20.76 kg/m   Physical Exam  Constitutional: She is oriented to person, place, and time. She appears well-developed and well-nourished.  HENT:  Head: Normocephalic.  MM slightly dry. TM nl bilaterally. Some tonsillar exudates bilaterally but posterior pharynx minimally red   Eyes: Pupils are equal, round, and reactive to light. Conjunctivae and EOM are normal.  Neck:  Mild bilateral cervical LAD, no stridor   Cardiovascular: Normal rate, regular rhythm and normal heart sounds.   Pulmonary/Chest: Effort normal and breath sounds normal. No respiratory distress. She has no wheezes. She has no rales.  Abdominal: Soft. Bowel sounds are normal.  Mild epigastric tenderness   Musculoskeletal: Normal range of motion. She exhibits no edema.  Neurological: She is alert and  oriented to person, place, and time. She displays normal reflexes. No cranial nerve deficit. Coordination normal.  Skin: Skin is warm.  Psychiatric: She has a normal mood and affect.  Nursing note and vitals reviewed.    ED Treatments / Results  Labs (all labs ordered are listed, but only abnormal results are displayed) Labs Reviewed  COMPREHENSIVE METABOLIC PANEL - Abnormal; Notable for the following:       Result Value   Potassium 3.4 (*)    Glucose, Bld 145 (*)    All other components within normal limits  URINALYSIS, ROUTINE W REFLEX MICROSCOPIC - Abnormal; Notable for the following:    Color, Urine STRAW (*)    Ketones, ur 5 (*)    All other components within normal limits  CBG MONITORING, ED - Abnormal; Notable for the following:    Glucose-Capillary 136 (*)    All other components within normal limits  RAPID STREP SCREEN (NOT AT [facility])  CULTURE, GROUP A STREP ([place])  LIPASE, BLOOD  CBC  MONONUCLEOSIS SCREEN  POC URINE PREG, ED  I-STAT TROPONIN, ED    EKG  EKG Interpretation  Date/Time:  Sunday December 24 2016 06:09:53 EDT Ventricular Rate:  87 PR Interval:  114 QRS Duration: 82 QT Interval:  346 QTC Calculation: 416 R Axis:   75 Text Interpretation:  Normal sinus rhythm ST abnormality, possible digitalis effect Abnormal ECG No previous ECGs available Confirmed by Wandra Arthurs [phone]) on 12/24/2016 8:29:39 AM       Radiology Dg Neck Soft Tissue  Result Date: 12/24/2016 CLINICAL DATA:  46 year old female with neck pain and swelling for 2 days. EXAM: NECK SOFT TISSUES - 1+ VIEW COMPARISON:  None. FINDINGS: There is no evidence of retropharyngeal soft tissue swelling or epiglottic enlargement. The cervical airway is unremarkable and no radio-opaque foreign body identified. IMPRESSION: Negative. Electronically Signed   By: Margarette Canada M.D.   On: 12/24/2016 08:03   Ct Abdomen Pelvis W Contrast  Result Date: 12/24/2016 CLINICAL DATA:  Upper abdominal pain. Fever  and chills for 2 days. Shortness of breath. EXAM: CT ABDOMEN AND PELVIS WITH CONTRAST TECHNIQUE: Multidetector CT imaging of the abdomen and pelvis was performed using the standard protocol following bolus administration of intravenous contrast. CONTRAST:  14m ISOVUE-300 IOPAMIDOL (ISOVUE-300) INJECTION 61% COMPARISON:  None. FINDINGS: Lower chest: No acute abnormality. Hepatobiliary: No focal liver abnormality is seen. No gallstones, gallbladder wall thickening, or biliary dilatation. Pancreas: Unremarkable. No pancreatic ductal dilatation or surrounding inflammatory changes. Spleen: No splenic injury or perisplenic hematoma. Adrenals/Urinary Tract: Adrenal glands are unremarkable. Kidneys are normal, without renal calculi, focal lesion, or hydronephrosis. Bladder is unremarkable. Stomach/Bowel: Stomach is within normal limits. Appendix appears normal. No evidence of bowel wall thickening, distention, or inflammatory changes. Vascular/Lymphatic: No significant vascular findings are present. No enlarged abdominal or pelvic lymph nodes. Reproductive: Rounded enhancement in the right ovary is likely corpus luteum cyst. The  uterus and left ovary are normal. Other: No abdominal wall hernia or abnormality. No abdominopelvic ascites. Musculoskeletal: No acute or significant osseous findings. IMPRESSION: 1. There is a corpus luteum cyst in the right ovary. No other abnormalities. No cause for the patient's upper abdominal pain is identified on today's study. Electronically Signed   By: Dorise Bullion III M.D   On: 12/24/2016 10:17    Procedures Procedures (including critical care time)  Medications Ordered in ED Medications  sodium chloride 0.9 % bolus 1,000 mL (1,000 mLs Intravenous New Bag/Given 12/24/16 0847)  ondansetron (ZOFRAN) injection 4 mg (4 mg Intravenous Given 12/24/16 0848)  ketorolac (TORADOL) 30 MG/ML injection 30 mg (30 mg Intravenous Given 12/24/16 0848)  famotidine (PEPCID) IVPB 20 mg premix  (0 mg Intravenous Stopped 12/24/16 0918)  gi cocktail (Maalox,Lidocaine,Donnatal) (30 mLs Oral Given 12/24/16 0848)  iopamidol (ISOVUE-300) 61 % injection (100 mLs  Contrast Given 12/24/16 0928)     Initial Impression / Assessment and Plan / ED Course  I have reviewed the triage vital signs and the nursing notes.  Pertinent labs & imaging results that were available during my care of the patient were reviewed by me and considered in my medical decision making (see chart for details).     Cristina Boyd is a 46 y.o. female here with chills, epigastric pain, chest pain for several days. Afebrile, well appearing. Appears anxious. Recent EBV titers showed past exposure but no active infection. Abdominal pain is new. Will repeat CMP, get mono spot, repeat rapid strep, get CT ab/pel. I think likely viral infection.   11:04 AM WBC nl. LFTs nl. Mono neg. Strep neg. CT ab/pel showed R corpus luteum cyst and she has no lower abdominal tenderness. I think viral gastro. Will dc home with bentyl prn cramps, continue tylenol, motrin, rest for 2 days.   Final Clinical Impressions(s) / ED Diagnoses   Final diagnoses:  None    New Prescriptions New Prescriptions   No medications on file     Drenda Freeze, MD 12/24/16 1106

## 2016-12-24 NOTE — ED Notes (Signed)
Patient transported to CT 

## 2016-12-24 NOTE — ED Notes (Signed)
Patient transported to X-ray 

## 2016-12-24 NOTE — Discharge Instructions (Signed)
Stay hydrated.   Take tylenol, motrin for fever.   Expect low grade temperature and chills and abdominal pain for several days. You likely have a stomach virus.   Take reglan as needed for nausea.   Take bentyl for cramps   See your doctor next week   Return to ER if you have fever for a week, worse abdominal pain, chest pain, vomiting, dehydration

## 2016-12-24 NOTE — ED Triage Notes (Signed)
Reports waking up with sharp pain in upper abdomen that has come and gone.  Started back at 0500.  Also c/o feeling fever and chills for two days and SOB.  Was seen at family physicians office on Thursday for the same.  Did blood work and Insurance account manager.

## 2016-12-26 LAB — CULTURE, GROUP A STREP (THRC)

## 2016-12-28 LAB — QUANTIFERON-TB GOLD PLUS
QUANTIFERON TB1 AG VALUE: 0.03 [IU]/mL
QuantiFERON Nil Value: 0.03 IU/mL
QuantiFERON TB2 Ag Value: 0.03 IU/mL
QuantiFERON-TB Gold Plus: NEGATIVE

## 2016-12-28 LAB — SPECIMEN STATUS REPORT

## 2017-01-08 ENCOUNTER — Ambulatory Visit: Payer: BLUE CROSS/BLUE SHIELD | Admitting: Family Medicine

## 2017-01-15 ENCOUNTER — Ambulatory Visit: Payer: BLUE CROSS/BLUE SHIELD | Admitting: Urgent Care

## 2017-01-15 ENCOUNTER — Encounter: Payer: Self-pay | Admitting: Urgent Care

## 2017-01-15 VITALS — BP 103/71 | HR 76 | Temp 98.5°F | Resp 16 | Ht 63.75 in | Wt 119.8 lb

## 2017-01-15 DIAGNOSIS — J029 Acute pharyngitis, unspecified: Secondary | ICD-10-CM | POA: Diagnosis not present

## 2017-01-15 DIAGNOSIS — R05 Cough: Secondary | ICD-10-CM | POA: Diagnosis not present

## 2017-01-15 DIAGNOSIS — J069 Acute upper respiratory infection, unspecified: Secondary | ICD-10-CM | POA: Diagnosis not present

## 2017-01-15 DIAGNOSIS — R591 Generalized enlarged lymph nodes: Secondary | ICD-10-CM | POA: Diagnosis not present

## 2017-01-15 DIAGNOSIS — R059 Cough, unspecified: Secondary | ICD-10-CM

## 2017-01-15 LAB — POCT RAPID STREP A (OFFICE): Rapid Strep A Screen: NEGATIVE

## 2017-01-15 MED ORDER — HYDROCODONE-HOMATROPINE 5-1.5 MG/5ML PO SYRP: 5.0000 mL | ORAL_SOLUTION | Freq: Every evening | ORAL | 0 refills | Status: DC | PRN

## 2017-01-15 MED ORDER — CETIRIZINE HCL 10 MG PO TABS: 10.0000 mg | ORAL_TABLET | Freq: Every day | ORAL | 11 refills | Status: DC

## 2017-01-15 MED ORDER — BENZONATATE 100 MG PO CAPS: 100.0000 mg | ORAL_CAPSULE | Freq: Three times a day (TID) | ORAL | 0 refills | Status: DC | PRN

## 2017-01-15 MED ORDER — PSEUDOEPHEDRINE HCL ER 120 MG PO TB12
120.0000 mg | ORAL_TABLET | Freq: Two times a day (BID) | ORAL | 3 refills | Status: DC
Start: 2017-01-15 — End: 2017-07-12

## 2017-01-15 NOTE — Progress Notes (Addendum)
  Stratus interpretor was used to translate in [place].  MRN: [id number] DOB: 1971/01/30  Subjective:   Cristina Boyd is a 46 y.o. female presenting for 6 day history of productive cough that elicits chest pain, shob at night. Has also had sinus congestion, sinus pain, sore throat. Has tried NyQuil, Advil. Denies fever, ear pain, wheezing, n/v, abdominal pain, rashes. Denies history of allergies, asthma. Denies smoking cigarettes.   Cristina Boyd is not currently taking any medications. Also has No Known Allergies.  Cristina Boyd  has a past medical history of Fibroadenoma. Denies past surgical history.  Objective:   Vitals: BP 103/71   Pulse 76   Temp 98.5 F (36.9 C) (Oral)   Resp 16   Ht 5' 3.75" (1.619 m)   Wt 119 lb 12.8 oz (54.3 kg)   LMP  (LMP Unknown)   SpO2 98%   BMI 20.73 kg/m   Wt Readings from Last 3 Encounters:  01/15/17 119 lb 12.8 oz (54.3 kg)  12/24/16 120 lb (54.4 kg)  12/21/16 120 lb (54.4 kg)   Physical Exam  Constitutional: She is oriented to person, place, and time. She appears well-developed and well-nourished.  HENT:  TM's intact bilaterally, no effusions or erythema. Nasal turbinates pink and moist, nasal passages patent. No sinus tenderness. Oropharynx mild post-nasal drainage, mucous membranes moist.  Eyes: Right eye exhibits no discharge. Left eye exhibits no discharge.  Neck: Normal range of motion. Neck supple.  Cardiovascular: Normal rate, regular rhythm and intact distal pulses. Exam reveals no gallop and no friction rub.  No murmur heard. Pulmonary/Chest: No respiratory distress. She has no wheezes. She has no rales.  Neurological: She is alert and oriented to person, place, and time.  Skin: Skin is warm and dry.  Psychiatric: She has a normal mood and affect.    Assessment and Plan :   1. Upper respiratory tract infection, unspecified type 2. Sore throat 3. Cough - Will start management for what I suspect is a viral illness. Offered supportive care.   4.  Lymphadenopathy - At the end of her visit, patient wanted to discuss a mass over her left neck. She has undergone extensive testing in October 2018 for the same including blood work, imaging. I reviewed this with her and reassured her that no tests have come back positive including testing for tuberculosis, mono, strep, HIV. She requested an antibiotic to treat the mass which I re-directed patient toward a soft tissue neck U/S. I recommended she use ibuprofen and APAP in the meantime to address some of this inflammation.   Jaynee Eagles, PA-C [facility] [phone] 01/15/2017  1:45 PM

## 2017-01-15 NOTE — Patient Instructions (Addendum)
You may take 500mg  Tylenol with ibuprofen 600mg  every 6 hours with food for pain and inflammation associated with your lymph nodes.     Ho, Ng???i l??n (Cough, Adult) Ho l ph?n x? la?m sa?ch c? h?ng va? khi? qua?n cu?a quy? vi?. Ho gip ch?a lnh v b?o v? ph?i c?a quy? vi?. Thi?nh thoa?ng ho l bnh th??ng, nh?ng ho ke?m theo cc tri?u ch?ng khc ho?c ko di c th? l m?t d?u hi?u c?a m?t b?nh ly? c?n ?i?u tr?Marland Kitchen Ho c th? ko da?i ch? 2-3 tu?n (c?p tnh), ho?c n c th? ko di h?n 8 tu?n (m?n tnh). [place] th??ng la? do:  Hi?t vo cc ch?t gy kch ?ng ph?i c?a quy? vi?.  Nhi?m vi rt ho?c vi khu?n ???ng h h?p.  D? ?ng.  Hen suy?n.  Cha?y di?ch mu?i sau.  Ht thu?c l.  Axi?t tra?o ng???c t?? d? dy ln th?c qu?n (tro ng??c d? dy th??c qua?n).  M?t s? lo?i thu?c nh?t ??nh.  Nh??ng v?n ?? ph?i ma?n tnh, k? c? COPD (ho?c hi?m khi, ung th? ph?i).  Cc ti?nh tra?ng b?nh ly? kha?c ch?ng h?n nh? suy tim. H??NG D?N CH?M [place] T?I NH Ch  ??n nh?ng thay ??i v? tri?u ch?ng c?a qu v?. Ti?n hnh nh?ng hnh ??ng sau ?? gip gi?m kho? chi?u:  Ch? s? d?ng thu?c theo ch? d?n c?a chuyn gia ch?m [place] s?c kh?e cu?a quy? vi?. ? N?u qu v? ???c k thu?c khng sinh, hy dng thu?c theo ch? d?n c?a chuyn gia ch?m [place] s?c kh?e. Khng d?ng u?ng thu?c khng sinh ngay c? khi qu v? b?t ??u c?m th?y ?? h?n. ? Ni chuy?n v?i chuyn gia ch?m [place] s?c kh?e tr??c khi quy? vi? du?ng m?t lo?i thu?c gia?m ho.  U?ng ?? n??c ?? gi? cho n??c ti?u trong ho?c c mu vng nh?t.  N?u khng kh kh, ha?y s? d?ng m?t ma?y h?i n???c ho??c ma?y t?o ?m trong phng ng? ho?c nha? quy? vi? ?? gip la?m l?ng di?ch ti?t ra.  Trnh b?t c? ?i?u g la?m quy? vi? ho t?i n?i lm vi?c hay ? nh.  N?u quy? vi? ho n??ng h?n vo ban ?m, hy th? ng? ?? v? tr n??a ng?i n??a n??m.  Hessie Diener khi thu?c l. N?u qu v? ht thu?c, ha?y cai thu?c. N?u qu v? c?n gip ?? ?? cai thu?c, hy h?i chuyn gia ch?m [place] s?c  kh?e.  Trnh caffeine.  Trnh u?ng r??u.  Ngh? ng?i khi c?n. ?I KHM N?U:  Qu v? c cc tri?u ch?ng m?i.  Quy? vi? ho ra m?Floyde Parkins? vi? khng ??? ho sau 2-3 tu?n, ho?c quy? vi? ho n?ng h?n.  Qu v? khng th? ki?m sot c?n ho c?a mnh b?ng thu?c ho v quy? vi? b? m?t ng?.  Quy? vi? bi? ?au ma? tr?? nn n??ng h?n ho?c ?au m khng ki?m sot ????c b??ng thu?c gia?m ?au.  Qu v? b? s?t.  Qu v? b? s?t cn khng r nguyn nhn.  Qu v? ?? m? hi ban ?m.  NGAY L?P T?C ?I KHM N?U:  Qu v? ho ra mu.  Qu v? b? kh th?.  Nh?p tim c?a quy? vi? r?t nhanh.  Thng tin ny khng nh?m m?c ?ch thay th? cho l?i khuyn m chuyn gia ch?m [place] s?c kh?e ni v?i qu v?. Hy b?o ??m qu v? ph?i th?o lu?n b?t k? v?n ?? g m qu v? c v?i chuyn gia ch?m  [place] s?c kh?e c?a qu v?. Document Released: 06/14/2015 Document Revised: 06/14/2015 Document Reviewed: 04/29/2014 Elsevier Interactive Patient Education  2017 [place].     IF you received an x-ray today, you will receive an invoice from [facility]. Please contact [facility] at [phone] with questions or concerns regarding your invoice.   IF you received labwork today, you will receive an invoice from [place]. Please contact [facility] at [phone] with questions or concerns regarding your invoice.   Our billing staff will not be able to assist you with questions regarding bills from these companies.  You will be contacted with the lab results as soon as they are available. The fastest way to get your results is to activate your My Chart account. Instructions are located on the last page of this paperwork. If you have not heard from [place] regarding the results in 2 weeks, please contact this office.

## 2017-01-17 LAB — CULTURE, GROUP A STREP: STREP A CULTURE: NEGATIVE

## 2017-01-18 ENCOUNTER — Ambulatory Visit: Payer: Self-pay | Admitting: Physician Assistant

## 2017-02-06 ENCOUNTER — Ambulatory Visit
Admission: RE | Admit: 2017-02-06 | Discharge: 2017-02-06 | Disposition: A | Discharge status: Home / Self Care | Payer: BLUE CROSS/BLUE SHIELD | Source: Ambulatory Visit | Attending: Urgent Care | Admitting: Urgent Care

## 2017-02-06 DIAGNOSIS — R591 Generalized enlarged lymph nodes: Secondary | ICD-10-CM

## 2017-03-02 ENCOUNTER — Other Ambulatory Visit: Payer: BLUE CROSS/BLUE SHIELD

## 2017-03-07 ENCOUNTER — Encounter: Payer: Self-pay | Admitting: Family Medicine

## 2017-06-13 ENCOUNTER — Encounter: Payer: Self-pay | Admitting: Physician Assistant

## 2017-07-12 ENCOUNTER — Ambulatory Visit: Payer: BLUE CROSS/BLUE SHIELD | Admitting: Urgent Care

## 2017-07-12 VITALS — BP 126/84 | Temp 98.4°F | Resp 16 | Ht 63.75 in | Wt 114.6 lb

## 2017-07-12 DIAGNOSIS — R7303 Prediabetes: Secondary | ICD-10-CM

## 2017-07-12 DIAGNOSIS — R631 Polydipsia: Secondary | ICD-10-CM

## 2017-07-12 DIAGNOSIS — R634 Abnormal weight loss: Secondary | ICD-10-CM | POA: Diagnosis not present

## 2017-07-12 DIAGNOSIS — R5383 Other fatigue: Secondary | ICD-10-CM | POA: Diagnosis not present

## 2017-07-12 DIAGNOSIS — R350 Frequency of micturition: Secondary | ICD-10-CM | POA: Diagnosis not present

## 2017-07-12 DIAGNOSIS — R5381 Other malaise: Secondary | ICD-10-CM | POA: Diagnosis not present

## 2017-07-12 LAB — POCT URINALYSIS DIP (MANUAL ENTRY)
BILIRUBIN UA: NEGATIVE
GLUCOSE UA: NEGATIVE mg/dL
LEUKOCYTES UA: NEGATIVE
NITRITE UA: NEGATIVE
PH UA: 6 (ref 5.0–8.0)
Protein Ur, POC: NEGATIVE mg/dL
Spec Grav, UA: 1.01 (ref 1.010–1.025)
Urobilinogen, UA: 0.2 E.U./dL

## 2017-07-12 LAB — POCT GLYCOSYLATED HEMOGLOBIN (HGB A1C): Hemoglobin A1C: 5.8

## 2017-07-12 NOTE — Progress Notes (Signed)
MRN: [id number] DOB: 1970/09/25  Subjective:   Cristina Boyd is a 47 y.o. female presenting for 1 month history of progressive fatigue, malaise, polydipsia, polyuria, intermittent belly pain, weight loss, chest heaviness.  Reports that her polyuria has kept her awake at night.  Patient does check her blood sugar at home as she has had previously elevated levels.  She has not been diagnosed with diabetes.  Denies history of anemia or thyroid disorder.  She has not tried any medications for her symptoms.  [name] currently has no medications in their medication list. Also has No Known Allergies.  Cristina Boyd  has a past medical history of Fibroadenoma. Denies past surgical history.  Objective:   Vitals: BP 126/84   Temp 98.4 F (36.9 C) (Oral)   Resp 16   Ht 5' 3.75" (1.619 m)   Wt 114 lb 9.6 oz (52 kg)   SpO2 99%   BMI 19.83 kg/m   Wt Readings from Last 3 Encounters:  07/12/17 114 lb 9.6 oz (52 kg)  01/15/17 119 lb 12.8 oz (54.3 kg)  12/24/16 120 lb (54.4 kg)   Physical Exam  Constitutional: She is oriented to person, place, and time. She appears well-developed and well-nourished.  HENT:  Mouth/Throat: Oropharynx is clear and moist.  Eyes: Pupils are equal, round, and reactive to light. EOM are normal. No scleral icterus.  Neck: Normal range of motion. Neck supple. No thyromegaly present.  Cardiovascular: Normal rate, regular rhythm and intact distal pulses. Exam reveals no gallop and no friction rub.  No murmur heard. Pulmonary/Chest: No respiratory distress. She has no wheezes. She has no rales.  Abdominal: Soft. Bowel sounds are normal. She exhibits no distension and no mass. There is no tenderness. There is no rebound and no guarding.  Musculoskeletal: She exhibits no edema.  Neurological: She is alert and oriented to person, place, and time. She displays normal reflexes. Coordination normal.  Skin: Skin is warm and dry. No rash noted. No erythema. No pallor.  Psychiatric:  Anxious  demeanor.   Results for orders placed or performed in visit on 07/12/17 (from the past 24 hour(s))  POCT urinalysis dipstick     Status: Abnormal   Collection Time: 07/12/17 11:30 AM  Result Value Ref Range   Color, UA yellow yellow   Clarity, UA clear clear   Glucose, UA negative negative mg/dL   Bilirubin, UA negative negative   Ketones, POC UA small (15) (A) negative mg/dL   Spec Grav, UA 1.010 1.010 - 1.025   Blood, UA trace-lysed (A) negative   pH, UA 6.0 5.0 - 8.0   Protein Ur, POC negative negative mg/dL   Urobilinogen, UA 0.2 0.2 or 1.0 E.U./dL   Nitrite, UA Negative Negative   Leukocytes, UA Negative Negative  POCT glycosylated hemoglobin (Hb A1C)     Status: None   Collection Time: 07/12/17 11:50 AM  Result Value Ref Range   Hemoglobin A1C 5.8    Assessment and Plan :   Frequent urination - Plan: POCT glycosylated hemoglobin (Hb A1C), POCT urinalysis dipstick, CBC  Fatigue, unspecified type - Plan: Thyroid Panel With TSH, CBC, Basic metabolic panel  Malaise  Polydipsia  Loss of weight  Pre-diabetes  Patient is very anxious about her symptoms today.  I did my best to reassure patient that she has prediabetes and not full-blown diabetes.  I did validate that she has symptoms consistent with diabetes but could only recommend the healthy diet that she is practicing.  I  did advise patient that she is able to eat protein including red meat and pork.  Labs are pending for screen for anemia and thyroid disorder.  I did recommend patient hydrate adequately every day with at least 2 L water.  Will establish plan for follow-up once labs result.  Cristina Eagles, PA-C [facility] [phone] 07/12/2017  11:43 AM

## 2017-07-12 NOTE — Patient Instructions (Addendum)
Hydrate well with at least 2 liters (1 gallon) of water daily. Make sure you eat 3 meals per day.      Phng ng?a b?nh ti?u ???ng tup 2 Preventing Type 2 Diabetes Mellitus Ti?u ???ng tup 2 (b?nh ti?u ???ng tup 2) l b?nh ko di (m?n tnh) ?nh h??ng ??n m?c ???ng (glucose) trong mu. Thng th???ng, m?t hc mn tn l insulin cho phe?p glucose ?i va?o va?o t? ba?o trong c? th?. Nh??ng t? ba?o na?y s?? du?ng glucose ?? ta?o ra n?ng l???ng. ?? b?nh ti?u ????ng tup 2, c th? c m?t ho??c ca? hai v?n ?? sau:  C? th? khng s?n xu?t ?? insulin.  C? th? khng ?a?p ??ng thch h??p v??i insulin ma? n ta?o ra (kha?ng insulin).  Ti?nh tra?ng kha?ng insulin ho??c thi?u h?t insulin lm cho ???ng d? th?a tch t? trong mu thay v ?i vo trong cc t? bo. K?t qu? l, glucose trong mu cao (t?ng ???ng huy?t) pht sinh, c th? gy ra nhi?u bi?n ch?ng. B? th?a cn ho?c bo ph ho?c c l?i s?ng khng ho?t ??ng (t?nh t?i) c th? lm t?ng nguy c? b? ti?u ???ng c?a qu v?. C th? lm ch?m ho?c phng ng?a ti?u ???ng tup 2 b?ng cch th?c hi?n m?t s? thay ??i nh?t ??nh v? dinh d??ng v l?i s?ng. C th? th?c hi?n nh?ng thay ??i dinh d??ng no?  ?n cc b?a ?n chnh v b?a ?n nh? th??ng xuyn. Mang theo m?t b?a ?n nh? lnh m?nh khi qu v? b? ?i gi?a cc b?a ?n chnh, ch?ng h?n nh? tri cy ho?c m?t n?m ??y qu? h?ch.  ?n th?t n?c v protein th?t n?c c t ch?t bo bo ha, ch?ng h?n nh? th?t g, c, lng tr?ng tr?ng v ??u h?t. Hessie Diener cc lo?i th?t ch? bi?n s??n.  ?n th?t nhi?u tri cy v rau c? v nhi?u ng? c?c ch?a ???c x? l (ng? c?c nguyn cm). Qu v? nn ?n: ? 1?2 c?c tri cy m?i ngy. ? 2?3 c?c rau c? m?i ngy. ? 6?8 ao-x? ng? c?c nguyn cm m?i ngy, ch?ng h?n nh? y?n m?ch, la m nguyn cm, bulgur, g?o l?t, dim m?ch v k.  ?n cc s?n ph?m s??a i?t ch?t be?o, ch?ng h?n nh? s?a, s??a chua va? pho mt.  ?n cc th?c ph?m ch?a ch?t bo lnh m?nh, ch?ng h?n nh? qu? h?ch, qu? b?, d?u  liu v  d?u canola.  U?ng n??c trong c? ngy. Trnh cc ?? u?ng c thm ???ng, ch?ng h?n nh? soda v tr ng?t.  Tun th? ch? d?n c?a chuyn gia ch?m [place] s?c kh?e v? cc h?n ch? ?n ho?c u?ng c? th?.  Ki?m sot l??ng th?c ph?m m qu v? ?n m?i l?n (kch c? kh?u ph?n). ? Ki?m tra nhn th?c ph?m ?? tm kch c? kh?u ph?n c?a th?c ph?m. ? S? d?ng cn nh b?p ?? cn l??ng th?c ph?m.  p ch?o ho?c h?p ?? ?n thay v chin xo. N?u v?i n??c ho?c n??c canh thay v d?u ho?c b?.  Gi?i h?n l??ng tiu th?: ? Mu?i (natri). ?n khng qu 1 mu?ng tr (2.400 mg) natri m?i ngy. N?u qu v? b? b?nh tim ho?c cao huy?t p, ?n t h?n ? mu?ng c ph (1.500 mg) natri m?t ngy. ? Ch?t bo bo ha. Ch?t bo ny c th? r?n ? nhi?t ?? phng, ch?ng h?n nh? b? ho?c m? trn th?t. C th? th?c hi?n nh?ng thay ??i no v? l?i  s?ng?  Ho?t ??ng  Ho?t ??ng th? ch?t c??ng ?? v?a ph?i trong t nh?t 30 pht vo t nh?t 5 ngy trong tu?n ho?c theo ch? d?n c?a chuyn gia ch?m [place] s?c kh?e.  H?i chuyn gia ch?m [place] s?c kh?e v? cc ho?t ??ng no an ton cho qu v?. Ph?i h?p cc ho?t ??ng th? ch?t c th? l t?t nh?t, ch?ng h?n nh? ?i b?, b?i, ??p xe v t?p s?c m?nh.  C? g?ng thm ho?t ??ng th? ch?t vo ngy c?a qu v?. V d?: ? ?? xe ? cc ?i?m cch xa h?n bnh th??ng, nh? v?y qu v? s? ?i b? nhi?u h?n. V d?: ?? xe ? m?t gc xa c?a bi ??u xe khi qu v? ?i ln v?n phng ho?c c?a hng t?p ha. ? ?i d?o trong gi? ngh? ?n tr?a. ? S? d?ng thang b? thay v thang my. Gi?m cn  Gi?m cn theo ch? d?n. Chuyn gia ch?m [place] s?c kh?e c th? xc ??nh gi?m bao nhiu cn th t?t nh?t cho qu v? v c th? gip qu v? gi?m cn an ton.  N?u qu v? b? th?a cn ho?c bo ph, qu v? c th? ???c h??ng d?n gi?m t nh?t 5?7 % cn n?ng. R??u v thu?c l   Gi?i h?n l??ng r??u qu v? u?ng khng qu 1 ly m?i ngy v?i ph? n? khng mang thai v 2 ly m?i ngy v?i nam gi?i. M?t ly t??ng ???ng v?i 12 ao-x? bia, 5 ao-x? r??u vang, ho?c 1 ao-x? r??u m?nh.  Khng  s? d?ng b?t k? s?n ph?m thu?c l no, ch?ng ha?n thu?c l d?ng ht, thu?c l d?ng nhai v thu?c l ?i?n t?. N?u qu v? c?n gip ?? ?? cai thu?c, hy h?i chuyn gia ch?m [place] s?c kh?e. Lm vi?c v?i chuyn gia ch?m [place] s?c kh?e  Ki?m tra huy?t p ??nh k? theo h??ng d?n c?a chuyn gia ch?m [place] s?c kh?e.  Th?o lu?n cc y?u t? nguy c? c?a qu v? v cch gi?m nguy c? ti?u ???ng c?a qu v?.  Lm xt nghi?m sng l?c theo ch? d?n c?a chuyn gia ch?m [place] s?c kh?e. Qu v? c th? ???c lm xt nghi?m sng l?c ??nh k?, ??c bi?t l n?u qu v? c m?t s? y?u t? nguy c? b? ti?u ???ng tup 2 nh?t ??nh.  ??t l?ch h?n v?i chuyn gia v? ch? ?? ?n v dinh d??ng (chuyn gia dinh d??ng c gi?y php hnh ngh?). Chuyn gia dinh d??ng c gi?y php hnh ngh? c th? gip qu v? l?p m?t k? ho?ch ?n u?ng lnh m?nh v c th? gip qu v? hi?u v? cc kch c? kh?u ph?n v nhn th?c ph?m. T?i sao nh?ng thay ??i ny l?i quan tr?ng?  C th? phng ng?a ho?c lm ch?m ti?u ???ng tup 2 v cc v?n ?? s?c kh?e lin quan b?ng cch th?c hi?n thay ??i l?i s?ng v dinh d??ng.  C th? kh nh?n ra cc d?u hi?u c?a ti?u ???ng tup 2. Cch t?t nh?t ?? trnh kh? n?ng t?n th??ng c? th? l hnh ??ng ?? ng?n ng?a b?nh tr??c khi qu v? c cc tri?u ch?ng. ?i?u g c th? x?y ra n?u khng thay ??i?  M?c glucose trong mu qu v? c th? t?ng lin t?c. C n?ng ?? glucose trong mu cao trong th?i gian di l r?t nguy hi?m. Qu nhi?u glucose trong mu c?a qu v? c th? gy t?n th??ng cc m?ch mu,  tim, th?n, dy th?n kinh v m?t c?a qu v?.  Qu v? c th? b? ti?n ti?u ???ng ho?c ti?u ???ng tup 2. Ti?u ???ng tup 2 c th? d?n ??n nhi?u v?n ?? s?c kh?e m?n tnh v bi?n ch?ng, ch?ng h?n nh?: ? B?nh tim. ? ??t qu?. ? M. ? B?nh th?n. ? Tr?m c?m. ? Tu?n hon km ? bn chn v c?ng chn, c th? d?n ??n ph?u thu?t tho chn (c?t c?t) ? cc ca n?ng. Tm s? h? tr? ? ?u:  Hy yu c?u chuyn gia ch?m [place] s?c kh?e c?a qu v? gi?i thi?u m?t chuyn gia dinh d??ng  c gi?y php hnh ngh?, ng??i h??ng d?n v? ti?u ???ng ho?c ch??ng trnh gi?m cn.  Tm ki?m cc nhm gi?m cn ? ??a ph??ng v trn m?ng.  Tham gia m?t l?p t?p gym, cu l?c b? th? hnh ho?c nhm ho?t ??ng ngoi tr?i, ch?ng h?n nh? cu l?c b? ?i b?. N?i ?? tm thm thng tin: ?? tm hi?u thm v? cc phng ng?a ti?u ???ng, hy gh:  Hi?p h?i Ti?u ????ng My? (ADA): www.diabetes.org  Vi?n Ti?u ???ng v cc B?nh v? Tiu Ha v Th?n Qu?c Gia: FindSpin.nl  ?? tm hi?u thm v? cch ?n u?ng lnh m?nh, hy gh:  B? Nng nghi?p [place] K? Scientist, research (physical sciences)), Choose My Plate: http://wiley-williams.com/  V?n phng Phng ch?ng b?nh t?t v T?ng c??ng S?c kh?e (ODPHP), Dietary Guidelines: Surfer[employer].at  Tm t?t  Qu v? c th? gi?m nguy c? b? ti?u ???ng tup 2 b?ng cch t?ng ho?t ??ng th? ch?t, ?n cc th?c ph?m lnh m?nh v gi?m cn theo ch? d?n.  Hy trao ??i v?i chuyn gia ch?m [place] s?c kh?e v? nguy c? b? ti?u ???ng tup 2 c?a qu v?. Hy h?i v? b?t k? xt nghi?m mu ho?c xt nghi?m sng l?c no m qu v? c?n lm. Thng tin ny khng nh?m m?c ?ch thay th? cho l?i khuyn m chuyn gia ch?m [place] s?c kh?e ni v?i qu v?. Hy b?o ??m qu v? ph?i th?o lu?n b?t k? v?n ?? g m qu v? c v?i chuyn gia ch?m  s?c kh?e c?a qu v?. Document Released: 06/08/2016 Document Revised: 06/08/2016 Elsevier Interactive Patient Education  [employer].     Fatigue Fatigue is feeling tired all of the time, a lack of energy, or a lack of motivation. Occasional or mild fatigue is often a normal response to activity or life in general. However, long-lasting (chronic) or extreme fatigue may indicate an underlying medical condition. Follow these instructions at home: Watch your fatigue for any changes. The following actions may help to lessen any discomfort you are feeling:  Talk to your health care provider about how much sleep you need each night. Try to get the  required amount every night.  Take medicines only as directed by your health care provider.  Eat a healthy and nutritious diet. Ask your health care provider if you need help changing your diet.  Drink enough fluid to keep your urine clear or pale yellow.  Practice ways of relaxing, such as yoga, meditation, massage therapy, or acupuncture.  Exercise regularly.  Change situations that cause you stress. Try to keep your work and personal routine reasonable.  Do not abuse illegal drugs.  Limit alcohol intake to no more than 1 drink per day for nonpregnant women and 2 drinks per day for men. One drink equals 12 ounces of beer, 5 ounces of wine, or 1 ounces of hard  liquor.  Take a multivitamin, if directed by your health care provider.  Contact a health care provider if:  Your fatigue does not get better.  You have a fever.  You have unintentional weight loss or gain.  You have headaches.  You have difficulty: ? Falling asleep. ? Sleeping throughout the night.  You feel angry, guilty, anxious, or sad.  You are unable to have a bowel movement (constipation).  You skin is dry.  Your legs or another part of your body is swollen. Get help right away if:  You feel confused.  Your vision is blurry.  You feel faint or pass out.  You have a severe headache.  You have severe abdominal, pelvic, or back pain.  You have chest pain, shortness of breath, or an irregular or fast heartbeat.  You are unable to urinate or you urinate less than normal.  You develop abnormal bleeding, such as bleeding from the rectum, vagina, nose, lungs, or nipples.  You vomit blood.  You have thoughts about harming yourself or committing suicide.  You are worried that you might harm someone else. This information is not intended to replace advice given to you by your health care provider. Make sure you discuss any questions you have with your health care provider. Document Released:  12/18/2006 Document Revised: 07/29/2015 Document Reviewed: 06/24/2013 Elsevier Interactive Patient Education  2018 [employer].     IF you received an x-ray today, you will receive an invoice from [facility]. Please contact [facility] at [phone] with questions or concerns regarding your invoice.   IF you received labwork today, you will receive an invoice from [place]. Please contact [facility] at [phone] with questions or concerns regarding your invoice.   Our billing staff will not be able to assist you with questions regarding bills from these companies.  You will be contacted with the lab results as soon as they are available. The fastest way to get your results is to activate your My Chart account. Instructions are located on the last page of this paperwork. If you have not heard from [place] regarding the results in 2 weeks, please contact this office.

## 2017-07-13 LAB — BASIC METABOLIC PANEL
BUN/Creatinine Ratio: 16 (ref 9–23)
BUN: 10 mg/dL (ref 6–24)
CALCIUM: 9.9 mg/dL (ref 8.7–10.2)
CHLORIDE: 101 mmol/L (ref 96–106)
CO2: 21 mmol/L (ref 20–29)
Creatinine, Ser: 0.64 mg/dL (ref 0.57–1.00)
GFR calc Af Amer: 124 mL/min/{1.73_m2} (ref >59)
GFR calc non Af Amer: 107 mL/min/{1.73_m2} (ref >59)
GLUCOSE: 116 mg/dL — AB (ref 65–99)
POTASSIUM: 4.2 mmol/L (ref 3.5–5.2)
Sodium: 136 mmol/L (ref 134–144)

## 2017-07-13 LAB — THYROID PANEL WITH TSH
FREE THYROXINE INDEX: 2.9 (ref 1.2–4.9)
T3 Uptake Ratio: 27 % (ref 24–39)
T4 TOTAL: 10.9 ug/dL (ref 4.5–12.0)
TSH: 1.47 u[IU]/mL (ref 0.450–4.500)

## 2017-07-13 LAB — CBC
Hematocrit: 41.7 % (ref 34.0–46.6)
Hemoglobin: 14 g/dL (ref 11.1–15.9)
MCH: 31.3 pg (ref 26.6–33.0)
MCHC: 33.6 g/dL (ref 31.5–35.7)
MCV: 93 fL (ref 79–97)
PLATELETS: 284 10*3/uL (ref 150–379)
RBC: 4.48 x10E6/uL (ref 3.77–5.28)
RDW: 12.7 % (ref 12.3–15.4)
WBC: 4.8 10*3/uL (ref 3.4–10.8)

## 2017-07-19 ENCOUNTER — Encounter: Payer: Self-pay | Admitting: Urgent Care

## 2017-07-19 ENCOUNTER — Ambulatory Visit: Payer: BLUE CROSS/BLUE SHIELD | Admitting: Urgent Care

## 2017-07-19 VITALS — BP 110/60 | HR 63 | Temp 98.4°F | Resp 16 | Ht 62.5 in | Wt 119.2 lb

## 2017-07-19 DIAGNOSIS — R102 Pelvic and perineal pain: Secondary | ICD-10-CM | POA: Diagnosis not present

## 2017-07-19 DIAGNOSIS — R5383 Other fatigue: Secondary | ICD-10-CM | POA: Diagnosis not present

## 2017-07-19 DIAGNOSIS — R350 Frequency of micturition: Secondary | ICD-10-CM | POA: Diagnosis not present

## 2017-07-19 DIAGNOSIS — R7303 Prediabetes: Secondary | ICD-10-CM

## 2017-07-19 DIAGNOSIS — R300 Dysuria: Secondary | ICD-10-CM

## 2017-07-19 DIAGNOSIS — R5381 Other malaise: Secondary | ICD-10-CM

## 2017-07-19 LAB — POCT URINALYSIS DIP (MANUAL ENTRY)
Bilirubin, UA: NEGATIVE
Blood, UA: NEGATIVE
Glucose, UA: NEGATIVE mg/dL
Ketones, POC UA: NEGATIVE mg/dL
LEUKOCYTES UA: NEGATIVE
NITRITE UA: NEGATIVE
PROTEIN UA: NEGATIVE mg/dL
SPEC GRAV UA: 1.015 (ref 1.010–1.025)
Urobilinogen, UA: 0.2 E.U./dL
pH, UA: 7.5 (ref 5.0–8.0)

## 2017-07-19 MED ORDER — OXYBUTYNIN CHLORIDE 5 MG PO TABS: 5.0000 mg | ORAL_TABLET | Freq: Three times a day (TID) | ORAL | 1 refills | Status: DC

## 2017-07-19 NOTE — Progress Notes (Addendum)
    MRN: [id number] DOB: [birth year]  Subjective:   Cristina Boyd is a 47 y.o. female presenting for follow up on urinary frequency, dysuria and malaise.  She also reports having lower belly discomfort.  I reviewed labs with her today which were largely negative except for prediabetes.  Denies fever, nausea, vomiting, genital rashes, hematuria.  Denies history of bladder spasms, overactive bladder, interstitial cystitis.  She has never seen a urologist for her symptoms. She also complains of ongoing pelvic pain, pelvic cramping. LMP was 06/17/2017 and was regular. CT showed a cyst in 2018.  Cristina Boyd currently has no medications in their medication list. Also has No Known Allergies.  Cristina Boyd  has a past medical history of Fibroadenoma. Also  has no past surgical history on file.  Objective:   Vitals: BP 110/60   Pulse 63   Temp 98.4 F (36.9 C) (Oral)   Resp 16   Ht 5' 2.5" (1.588 m)   Wt 119 lb 3.2 oz (54.1 kg)   LMP 06/18/2017   SpO2 99%   BMI 21.45 kg/m   Physical Exam  Constitutional: She is oriented to person, place, and time. She appears well-developed and well-nourished.  HENT:  Mouth/Throat: Oropharynx is clear and moist.  Cardiovascular: Normal rate.  Pulmonary/Chest: Effort normal.  Abdominal: Soft. Bowel sounds are normal. She exhibits no distension and no mass. There is no tenderness. There is no rebound and no guarding.  Neurological: She is alert and oriented to person, place, and time.  Skin: Skin is warm and dry.  Psychiatric: She has a normal mood and affect.   Results for orders placed or performed in visit on 07/19/17 (from the past 24 hour(s))  POCT urinalysis dipstick     Status: None   Collection Time: 07/19/17 10:25 AM  Result Value Ref Range   Color, UA yellow yellow   Clarity, UA clear clear   Glucose, UA negative negative mg/dL   Bilirubin, UA negative negative   Ketones, POC UA negative negative mg/dL   Spec Grav, UA 1.015 1.010 - 1.025   Blood, UA negative  negative   pH, UA 7.5 5.0 - 8.0   Protein Ur, POC negative negative mg/dL   Urobilinogen, UA 0.2 0.2 or 1.0 E.U./dL   Nitrite, UA Negative Negative   Leukocytes, UA Negative Negative   Assessment and Plan :   Frequent urination - Plan: POCT urinalysis dipstick, Urine Culture, Ambulatory referral to Urology  Dysuria - Plan: Ambulatory referral to Urology  Malaise and fatigue  Pelvic pain in female - Plan: US Pelvis Complete, US Transvaginal Non-OB  Pelvic cramping - Plan: US Pelvis Complete, US Transvaginal Non-OB  Pre-diabetes  Will plan to start oxybutynin, labs pending. Referral to urology is pending. Return-to-clinic precautions discussed, patient verbalized understanding. Will pursue pelvic and transvaginal U/S.  Cristina Eagles, PA-C [facility] [phone] 07/19/2017 10:11 AM

## 2017-07-19 NOTE — Addendum Note (Signed)
Addended by: Jaynee Eagles on: 07/19/2017 10:38 AM   Modules accepted: Orders, Level of Service

## 2017-07-19 NOTE — Patient Instructions (Addendum)
Oxybutynin tablets ?y l thu?c g? OXYBUTYNIN ???c dng ?? ?i?u tr? ch?ng b?ng ?i qu nh?y (overactive bladder). Thu?c ny lm gi?m s? l?n ?i ti?u. N c?ng c th? gip ki?m sot cc s? c? sn n??c ti?u. Thu?c ny c th? ???c dng cho nh?ng m?c ?ch khc; hy h?i ng??i cung c?p d?ch v? y t? ho?c d??c s? c?a mnh, n?u qu v? c th?c m?c. (CC) NHN HI?U PH? BI?N: Ditropan Ti c?n ph?i bo cho ng??i cung c?p d?ch v? y t? c?a mnh ?i?u g tr??c khi dng thu?c ny? H? c?n bi?t li?u qu v? c b?t k? tnh tr?ng no sau ?y khng: -b?nh th?n kinh h? t? ch?/sinh d??ng (autonomic neuropathy) -ch?ng m?t tr nh? -kh ?i ti?u -b?nh t?ng nhn p -t?c ngh?n ru?t -b?nh th?n -b?nh gan -b?nh nh??c c? n?ng -b?nh Parkinson -pha?n ??ng b?t th???ng ho??c di? ??ng v??i oxybutynin -pha?n ??ng b?t th???ng ho??c di? ??ng v??i ca?c d??c ph?m kha?c -pha?n ??ng b?t th???ng ho??c di? ??ng v??i th??c ph?m, thu?c nhu?m, ho??c ch?t ba?o qua?n -?ang c thai ho??c ??nh co? thai -?ang cho con bu? Ti nn s? d?ng thu?c ny nh? th? no? U?ng thu?c ny v?i m?t ly n??c. Hy lm theo cc h??ng d?n trn h?p thu?c ho?c nhn thu?c. Qu v? c th? u?ng thu?c ny cng ho?c khng cng v?i th?c ?n. Dng thu?c ny vo nh?ng kho?ng th?i gian ??u nhau. Khng ???c dng thu?c ny nhi?u l?n h?n ? ???c ch? d?n. Hy bn v?i bc s? nhi khoa c?a qu v? v? vi?c dng thu?c ny ? tr? em. C th? c?n ch?m [place] ??c bi?t. Thu?c ny c th? ???c k toa cho tr? em ch? m?i 5 tu?i trong nh?ng tr??ng h?p ch?n l?c, nh?ng c?n ph?i th?n tr?ng. Qu li?u: N?u qu v? cho r?ng mnh ? dng qu nhi?u thu?c ny, th hy lin l?c v?i trung tm ki?m sot ch?t ??c ho?c phng c?p c?u ngay l?p t?c. L?U : Thu?c ny ch? dnh ring cho qu v?. Khng chia s? thu?c ny v?i nh?ng ng??i khc. N?u ti l? qun m?t li?u th sao? N?u qu v? l? qun m?t li?u thu?c, hy dng li?u thu?c ? ngay khi c th?. N?u h?u nh? ? ??n gi? dng li?u thu?c k? ti?p, th ch? dng li?u  thu?c k? ti?p ?Marland Kitchen Khng ???c dng li?u g?p ?i ho?c dng thm li?u. Nh?ng g c th? t??ng tc v?i thu?c ny? -cc thu?c khng histamine dng khi b? d? ?ng, ho v c?m l?nh -atropine -m?t s? thu?c dng ?? tr? cc v?n ?? v? b?ng ?i, ch?ng h?n nh? oxybutynin, tolterodine -m?t s? thu?c dng ?? tr? b?nh Parkinson, ch?ng h?n nh? benztropine, trihexyphenidyl -m?t s? thu?c dng cho cc v?n ?? v? bao t?, ch?ng h?n nh? dicyclomine, hyoscyamine -m?t s? thu?c dng cho ch?ng say tu xe, ch?ng h?n nh? scopolamine -clarithromycin -erythromycin -ipratropium -m?t s? thu?c dng ?? tr? cc b?nh nhi?m n?m, ch?ng h?n nh? fluconazole, itraconazole, ketoconazole, voriconazole Danh sch ny c th? khng m t? ?? h?t cc t??ng tc c th? x?y ra. Hy ??a cho ng??i cung c?p d?ch v? y t? c?a mnh danh sch t?t c? cc thu?c, th?o d??c, cc thu?c khng c?n toa, ho?c cc ch? ph?m b? sung m qu v? dng. C?ng nn bo cho h? bi?t r?ng qu v? c ht thu?c, u?ng r??u, ho?c c s? d?ng ma ty tri php hay khng. Vi th? c th? t??ng tc v?i thu?c  c?a qu v?. Ti c?n ph?i theo di ?i?u g trong khi dng thu?c ny? C th? m?t m?t vi tu?n ?? nh?n bi?t ???c l?i ch ??y ?? c?a thu?c ny. Qu v? c th? c?n h?n ch? u?ng tr, c ph, n??c soda c ch?a caffein, v r??u. Nh?ng ?? u?ng ny c th? lm cho cc tri?u ch?ng c?a qu v? n?ng h?n. Qu v? c th? b? bu?n ng? ho?c chng m?t. Khng ???c li xe, s? d?ng my mc, ho?c lm nh?ng vi?c c?n ph?i t?nh to cho t?i khi qu v? bi?t ???c thu?c ny ?nh h??ng ln qu v? nh? th? no. Khng ???c ng?i d?y ho?c ??ng d?y nhanh, ??c bi?t l khi qu v? l b?nh nhn l?n tu?i. ?i?u ny lm gi?m nguy c? b? chng m?t ho?c ng?t x?u. R??u c th? c?n tr? tc d?ng c?a thu?c ny. Hy trnh cc ?? u?ng c r??u. Qu v? c th? b? kh mi?ng. Nhai k?o cao su (chewing gum) khng c ???ng ho?c ng?m k?o c?ng, v u?ng nhi?u n??c c th? c ch. Hy lin l?c v?i bc s?, n?u v?n ?? nghim tr?ng ho?c khng ch?m d?t. Thu?c ny c  th? gy kh m?t v nhn m?. N?u qu v? mang knh p trng, th qu v? c th? c?m th?y kh ch?u ?i cht. Thu?c bi tr?n d?ng nh? gi?t c th? c ch. Hy ??n g?p chuyn vin nhn khoa, n?u v?n ?? khng ch?m d?t ho?c nghim tr?ng. Trnh n?i qu nng. Thu?c ny c th? lm cho qu v? ra m? hi t h?n bnh th??ng. Thn nhi?t c?a qu v? c th? t?ng cao ??n m?c nguy hi?m, c th? d?n ??n ch?ng trng nng. Ti c th? nh?n th?y nh?ng tc d?ng ph? no khi dng thu?c ny? Nh?ng tc d?ng ph? qu v? c?n ph?i bo cho bc s? ho?c chuyn vin y t? cng s?m cng t?t: -cc ph?n ?ng d? ?ng, ch?ng h?n nh? da b? m?n ??, ng?a, n?i my ?ay, s?ng ? m?t, mi, ho?c l??i -tnh tr?ng kch ??ng -cc v?n ?? v? h h?p -l l?n -s?t -?? b?ng ho?c m?n ?? da -?o [place] -?au khi ?i ti?u ho?c kh ?i ti?u -?nh tr?ng ng?c -m?t m?i ho?c y?u ?t b?t th??ng Cc tc d?ng ph? khng c?n ph?i ch?m [place] y t? (hy bo cho bc s? ho?c chuyn vin y t?, n?u cc tc d?ng ph? ny ti?p di?n ho?c gy phi?n toi): -to bn -?au ??u -cc kh kh?n v? sinh ho?t tnh d?c (b?nh li?t d??ng) Danh sch ny c th? khng m t? ?? h?t cc tc d?ng ph? c th? x?y ra. Xin g?i t?i bc s? c?a mnh ?? ???c c? v?n chuyn mn v? cc tc d?ng ph?Sander Nephew v? c th? t??ng trnh cc tc d?ng ph? cho FDA theo s? [phone]. Ti nn c?t gi? thu?c c?a mnh ? ?u? ?? ngoi t?m tay tr? em. C?t gi? ? nhi?t ?? phng t? 15 ??n 30 ?? C (60 ??n 87 ?? F). Hessie Diener ch? ?m ??t. V?t b? t?t c? thu?c ch?a dng sau ngy h?t h?n in trn nhn thu?c ho?c bao thu?c. L?U : ?y l b?n tm t?t. N c th? khng bao hm t?t c? thng tin c th? c. N?u qu v? th?c m?c v? thu?c ny, xin trao ??i v?i bc s?, d??c s?, ho?c ng??i cung c?p d?ch v? y t? c?a mnh.  2018 Elsevier/Gold Standard (2013-06-30 00:00:00)  Urinary Frequency, Adult Urinary frequency means urinating more often than usual. People with urinary frequency urinate at least 8 times in 24 hours, even if they drink a  normal amount of fluid. Although they urinate more often than normal, the total amount of urine produced in a day may be normal. Urinary frequency is also called pollakiuria. What are the causes? This condition may be caused by:  A urinary tract infection.  Obesity.  Bladder problems, such as bladder stones.  Caffeine or alcohol.  Eating food or drinking fluids that irritate the bladder. These include coffee, tea, soda, artificial sweeteners, citrus, tomato-based foods, and chocolate.  Certain medicines, such as medicines that help the body get rid of extra fluid (diuretics).  Muscle or nerve weakness.  Overactive bladder.  Chronic diabetes.  Interstitial cystitis.  In men, problems with the prostate, such as an enlarged prostate.  In women, pregnancy.  In some cases, the cause may not be known. What increases the risk? This condition is more likely to develop in:  Women who have gone through menopause.  Men with prostate problems.  People with a disease or injury that affects the nerves or spinal cord.  People who have or have had a condition that affects the brain, such as a stroke.  What are the signs or symptoms? Symptoms of this condition include:  Feeling an urgent need to urinate often. The stress and anxiety of needing to find a bathroom quickly can make this urge worse.  Urinating 8 or more times in 24 hours.  Urinating as often as every 1 to 2 hours.  How is this diagnosed? This condition is diagnosed based on your symptoms, your medical history, and a physical exam. You may have tests, such as:  Blood tests.  Urine tests.  Imaging tests, such as X-rays or ultrasounds.  A bladder test.  A test of your neurological system. This is the body system that senses the need to urinate.  A test to check for problems in the urethra and bladder called cystoscopy.  You may also be asked to keep a bladder diary. A bladder diary is a record of what you eat  and drink, how often you urinate, and how much you urinate. You may need to see a health care provider who specializes in conditions of the urinary tract (urologist) or kidneys (nephrologist). How is this treated? Treatment for this condition depends on the cause. Sometimes the condition goes away on its own and treatment is not necessary. If treatment is needed, it may include:  Taking medicine.  Learning exercises that strengthen the muscles that help control urination.  Following a bladder training program. This may include: ? Learning to delay going to the bathroom. ? Double urinating (voiding). This helps if you are not completely emptying your bladder. ? Scheduled voiding.  Making diet changes, such as: ? Avoiding caffeine. ? Drinking fewer fluids, especially alcohol. ? Not drinking in the evening. ? Not having foods or drinks that may irritate the bladder. ? Eating foods that help prevent or ease constipation. Constipation can make this condition worse.  Having the nerves in your bladder stimulated. There are two options for stimulating the nerves to your bladder: ? Outpatient electrical nerve stimulation. This is done by your health care provider. ? Surgery to implant a bladder pacemaker. The pacemaker helps to control the urge to urinate.  Follow these instructions at home:  Keep a bladder diary if told to by your health care provider.  Take  over-the-counter and prescription medicines only as told by your health care provider.  Do any exercises as told by your health care provider.  Follow a bladder training program as told by your health care provider.  Make any recommended diet changes.  Keep all follow-up visits as told by your health care provider. This is important. Contact a health care provider if:  You start urinating more often.  You feel pain or irritation when you urinate.  You notice blood in your urine.  Your urine looks cloudy.  You develop a  fever.  You begin vomiting. Get help right away if:  You are unable to urinate. This information is not intended to replace advice given to you by your health care provider. Make sure you discuss any questions you have with your health care provider. Document Released: 12/17/2008 Document Revised: 03/24/2015 Document Reviewed: 09/16/2014 Elsevier Interactive Patient Education  2018 [employer].     IF you received an x-ray today, you will receive an invoice from [facility]. Please contact [facility] at [phone] with questions or concerns regarding your invoice.   IF you received labwork today, you will receive an invoice from [place]. Please contact [facility] at [phone] with questions or concerns regarding your invoice.   Our billing staff will not be able to assist you with questions regarding bills from these companies.  You will be contacted with the lab results as soon as they are available. The fastest way to get your results is to activate your My Chart account. Instructions are located on the last page of this paperwork. If you have not heard from [place] regarding the results in 2 weeks, please contact this office.

## 2017-07-20 LAB — URINE CULTURE

## 2017-07-26 ENCOUNTER — Encounter: Payer: Self-pay | Admitting: Family Medicine

## 2017-08-09 ENCOUNTER — Telehealth: Payer: Self-pay | Admitting: Urgent Care

## 2017-08-09 NOTE — Telephone Encounter (Signed)
Please advise. Notes state pre diabetic but not on current treatment. Would need frequency of testing and icd10.

## 2017-08-09 NOTE — Telephone Encounter (Signed)
Copied from [place] [id number]. Topic: Quick Communication - See Telephone Encounter >> Aug 09, 2017  1:18 PM Bea Graff, NT wrote: CRM for notification. See Telephone encounter for: 08/09/17. Pt would like a rx for diabetic testing strips. [employer]. [facility] [phone] - [place], [address] AT [facility] [address] [place] [phone] (Phone) [phone] (Fax)

## 2017-08-13 NOTE — Telephone Encounter (Signed)
Patient does not need to check blood sugar at this point. We can recheck her a1c at 6 months to 1 year. Thanks.

## 2017-08-14 NOTE — Telephone Encounter (Signed)
Pt advised.

## 2017-10-16 ENCOUNTER — Encounter: Payer: Self-pay | Admitting: Urgent Care

## 2017-10-16 ENCOUNTER — Ambulatory Visit (INDEPENDENT_AMBULATORY_CARE_PROVIDER_SITE_OTHER): Payer: BLUE CROSS/BLUE SHIELD | Admitting: Urgent Care

## 2017-10-16 ENCOUNTER — Other Ambulatory Visit: Payer: Self-pay

## 2017-10-16 VITALS — BP 101/64 | HR 64 | Temp 97.9°F | Resp 16 | Ht 62.5 in | Wt 118.6 lb

## 2017-10-16 DIAGNOSIS — R1013 Epigastric pain: Secondary | ICD-10-CM | POA: Diagnosis not present

## 2017-10-16 DIAGNOSIS — R12 Heartburn: Secondary | ICD-10-CM

## 2017-10-16 LAB — COMPREHENSIVE METABOLIC PANEL
A/G RATIO: 1.6 (ref 1.2–2.2)
ALK PHOS: 51 IU/L (ref 39–117)
ALT: 17 IU/L (ref 0–32)
AST: 12 IU/L (ref 0–40)
Albumin: 4.4 g/dL (ref 3.5–5.5)
BILIRUBIN TOTAL: 0.4 mg/dL (ref 0.0–1.2)
BUN/Creatinine Ratio: 17 (ref 9–23)
BUN: 10 mg/dL (ref 6–24)
CHLORIDE: 104 mmol/L (ref 96–106)
CO2: 24 mmol/L (ref 20–29)
Calcium: 9.3 mg/dL (ref 8.7–10.2)
Creatinine, Ser: 0.6 mg/dL (ref 0.57–1.00)
GFR calc non Af Amer: 110 mL/min/{1.73_m2} (ref >59)
GFR, EST AFRICAN AMERICAN: 126 mL/min/{1.73_m2} (ref >59)
Globulin, Total: 2.8 g/dL (ref 1.5–4.5)
Glucose: 88 mg/dL (ref 65–99)
POTASSIUM: 3.8 mmol/L (ref 3.5–5.2)
Sodium: 140 mmol/L (ref 134–144)
Total Protein: 7.2 g/dL (ref 6.0–8.5)

## 2017-10-16 LAB — CBC
Hematocrit: 39 % (ref 34.0–46.6)
Hemoglobin: 12.8 g/dL (ref 11.1–15.9)
MCH: 31.7 pg (ref 26.6–33.0)
MCHC: 32.8 g/dL (ref 31.5–35.7)
MCV: 97 fL (ref 79–97)
PLATELETS: 263 10*3/uL (ref 150–450)
RBC: 4.04 x10E6/uL (ref 3.77–5.28)
RDW: 12.4 % (ref 12.3–15.4)
WBC: 5.1 10*3/uL (ref 3.4–10.8)

## 2017-10-16 MED ORDER — RANITIDINE HCL 150 MG PO TABS: 150.0000 mg | ORAL_TABLET | Freq: Two times a day (BID) | ORAL | 0 refills | Status: DC

## 2017-10-16 MED ORDER — OMEPRAZOLE 20 MG PO CPDR: 20.0000 mg | DELAYED_RELEASE_CAPSULE | Freq: Two times a day (BID) | ORAL | 1 refills | Status: DC

## 2017-10-16 NOTE — Progress Notes (Signed)
    MRN: [id number] DOB: 1970-04-19  Subjective:   Cristina Boyd is a 47 y.o. female presenting for 3-day history of persistent epigastric pain with associated heartburn, sour brash.  Denies nausea, vomiting, diarrhea, constipation, fever, rashes.  Patient reports that it has been hurting her to eat due to her symptoms.  Has tried Tums which is helped very little.  Denies history of H. pylori.  Cristina Boyd is not currently taking any medications.  Also has No Known Allergies.  Cristina Boyd  has a past medical history of Fibroadenoma.  Denies past surgical history.  Objective:   Vitals: BP 101/64   Pulse 64   Temp 97.9 F (36.6 C) (Oral)   Resp 16   Ht 5' 2.5" (1.588 m)   Wt 118 lb 9.6 oz (53.8 kg)   SpO2 98%   BMI 21.35 kg/m   Wt Readings from Last 3 Encounters:  10/16/17 118 lb 9.6 oz (53.8 kg)  07/19/17 119 lb 3.2 oz (54.1 kg)  07/12/17 114 lb 9.6 oz (52 kg)    Physical Exam  Constitutional: She is oriented to person, place, and time. She appears well-developed and well-nourished.  HENT:  Mouth/Throat: Oropharynx is clear and moist.  Eyes: Right eye exhibits no discharge. Left eye exhibits no discharge. No scleral icterus.  Cardiovascular: Normal rate, regular rhythm, normal heart sounds and intact distal pulses. Exam reveals no gallop and no friction rub.  No murmur heard. Pulmonary/Chest: Effort normal and breath sounds normal. No stridor. No respiratory distress. She has no wheezes. She has no rales.  Abdominal: Soft. Bowel sounds are normal. She exhibits no distension and no mass. There is tenderness (epigastric). There is no rebound and no guarding.  Neurological: She is alert and oriented to person, place, and time.  Skin: Skin is warm and dry.  Psychiatric: She has a normal mood and affect.   Assessment and Plan :   Abdominal pain, epigastric - Plan: H. pylori breath test  Epigastric pain - Plan: Comprehensive metabolic panel, CBC, H. pylori breath test  Heartburn  Will start  management for acid reflux, labs pending.  Counseled on possibility of H. pylori.  If all testing and medications do not help resolve her symptoms, will consider imaging versus referral to GI.  Patient is in agreement with treatment plan.  Jaynee Eagles, PA-C Primary Care at [facility] [phone] 10/16/2017  11:07 AM

## 2017-10-16 NOTE — Patient Instructions (Addendum)
Prilosec will help with both heartburn/GERD but it can take ~2 weeks to start working. For now, Zantac may help you faster in 1-2 days. So you can this over the next 1-2 weeks. If your H. Pylori test (a test to see if you have a stomach infection) is positive then you can stop taking Zantac. I will prescribe antibiotics for this and call to let you know that we need to treat the infection.     L?a Ch?n Th?c ?n cho B?nh Tro Ng??c D? Dy Th?c Qu?n, Ng??i L?n Food Choices for Gastroesophageal Reflux Disease, Adult Khi qu v? b? b?nh tro ng??c d? dy th?c qu?n (GERD), th?c ph?m qu v? ?n v thi quen ?n u?ng c?a qu v? l r?t quan tr?ng. L?a ch?n th?c ph?m ?ng c th? gip lm gi?m s? kh ch?u c?a b?nh tro ng??c d? dy th?c qu?n (GERD). Cn nh?c lm vi?c v?i m?t chuyn gia v? ch? ?? ?n v dinh d??ng (chuyn gia dinh d??ng) nh?m gip qu v? l?a ch?n nh?ng th?c ph?m t?t cho s?c kh?e. Nh?ng h??ng d?n chung no ti ph?i tun theo? K? ho?ch ?n u?ng  Ch?n nh?ng th?c ph?m t?t cho s?c kh?e t ch?t bo, ch?ng h?n nh? tri cy, rau c?, ng? c?c nguyn h?t, cc s?n ph?m t? s?a t bo, v th?t n?c, c, v th?t gia c?m.  ?n cc b?a nh?, th??ng xuyn thay v ba b?a l?n m?i ngy. ?n ch?m ri, trong khng gian th? gin. Trnh g?p ng??i ho?c n?m xu?ng cho ??n khi ?n xong ???c 2-3 gi?.  H?n ch? cc th?c ph?m giu ch?t bo ch?ng h?n nh? th?t nhi?u m? ho?c th?c ph?m chin.  Gi?i h?n l??ng tiu th? d?u, b?, v ch?t bo t? d?u th?c v?t ? m?c d??i 8 mu?ng c ph m?i ngy.  Tra?nh nh??ng th?? sau: ? Cc th?c ph?m gy ra cc tri?u ch?ng. Nh?ng tri?u ch?ng ny c th? khc nhau ??i v?i nh?ng ng??i khc nhau. Hy ghi nh?t k th?c ph?m ?? theo di nh?ng th?c ph?m no gy ra cc tri?u ch?ng. ? R??u. ? U?ng nhi?u ch?t l?ng khi ?n. ? ?n trong kho?ng 2-3 gi? tr??c khi ?i ng?.  N?u ?n b?ng cc ph??ng php khc thay vi? chin. Ph??ng php ny c th? bao g?m b? l, n??ng, ho?c hun nng. L?i s?ng   Duy tr m??c cn n?ng c  l?i cho s?c kh?e. H?i chuyn gia ch?m [place] s?c kh?e c?a quy? vi? xem m??c cn n??ng na?o la? t?t cho quy? vi?. N?u qu v? c?n gi?m cn, hy trao ??i v?i chuyn gia ch?m [place] s?c kh?e c?a qu v? ?? gi?m cn m?t cch an ton.  T?p th? d?c t?i thi?u 30 pht t? 5 ngy tr? ln m?i tu?n, ho?c theo ch? d?n c?a chuyn gia ch?m [place] s?c kh?e.  Trnh m?c qu?n o b ch?t quanh th?t l?ng v ng?c.  Khng s? d?ng b?t k? s?n ph?m no ch?a nicotine ho?c thu?c l, ch?ng ha?n nh? thu?c l d?ng ht v thu?c l ?i?n t?. N?u qu v? c?n gip ?? ?? cai thu?c, hy h?i chuyn gia ch?m [place] s?c kh?e.  Ng? v?i ??u gi??ng k cao. S? d?ng chm d??i n?m ho?c t?m k d??i khung gi??ng ?? nng ??u gi??ng ln. Nh?ng lo?i th?c ?n no khng ???c khuyn dng? Nh?ng m?c ???c li?t k c th? khng ph?i danh sch ??y ??. Hy trao ??i v?i bc s? chuyn khoa dinh d??ng  xem cc l?a ch?n ch? ?? dinh d??ng no ph h?p nh?t v?i qu v?. Ng? c?c Bnh ng?t ho?c bnh m n??ng nhanh b? sung ch?t bo. Bnh m n??ng c?a Php. Rau c? Marlou Starks c? chin ng?p d?u. Khoai ty chin. M?i lo?i rau c? ???c ch? bi?n km thm ch?t bo. M?i lo?i rau c? gy ra cc tri?u ch?ng. ??i v?i m?t s? ng??i, nh?ng lo?i rau c? ny c th? bao g?m c chua v cc s?n ph?m t? c chua, ?t, hnh v t?i, v c?i ng?a. Tri cy M?i lo?i tri cy ???c ch? bi?n km thm ch?t bo. M?i lo?i tri cy c th? gy ra cc tri?u ch?ng. ??i v?i m?t s? ng??i, nh?ng lo?i tri cy ny c th? bao g?m tri cy h? cam qut, ch?ng h?n nh? cam, b??i, qu? d?a, v chanh. Th?t v cc th?c ph?m ch?a protein khc Th?t giu ch?t bo, ch?ng h?n nh? th?t l?n ho?c th?t b nhi?u m?, xc xch, s??n, gi?m bng, l?p x??ng, [place] v th?t l?n mu?i xng khi. Th?t chin ho?c protein, bao g?m c chin v th?t g chin. Qu? h?ch v b? h?t. S?a. S?a nguyn kem v s?a s-c-la. Kem chuaSalome Spotted. Kem. Pho mt kem. S?a l?c. ?? u?ng C ph v tr, c ho?c khng c caffeine. ?? u?ng c ga. Soda. ?? u?ng t?ng l?c. N??c p tri cy  lm t? tri cy chua (ch?ng h?n nh? cam ho?c b??i). N??c p c chua. ?? u?ng ch?a c?n. M? v d?u B?. B? th?c v?t. Ch?t bo t? d?u th?c v?t. B? s??a tru. K?o v ?? trng mi?ng S-c-la v c ca. Bnh rnCassell Smiles v? v cc th?c ph?m khc. H?t tiu. B?c h v b?c h l?c. M?i lo?i gia v?, th?o d??c, ho?c gia v? gy ra cc tri?u ch?ng. ??i v?i m?t s? ng??i, gia v? v cc th?c ph?m khc c th? bao g?m c ri, n??c s?t nng, ho?c d?u d?m ?? tr?n salad. Tm t?t  Khi qu v? b? b?nh tro ng??c d? dy th?c qu?n (GERD), cc l?a ch?n th?c ph?m v l?i s?ng c vai tr r?t quan tr?ng ?? gip lm gi?m s? kh ch?u c?a GERD.  ?n cc b?a nh?, th??ng xuyn thay v ba b?a l?n m?i ngy. ?n ch?m ri, trong khng gian th? gin. Trnh g?p ng??i ho?c n?m xu?ng cho ??n khi ?n xong ???c 2-3 gi?.  H?n ch? cc th?c ph?m giu ch?t bo, ch?ng h?n nh? th?t m? ho?c th?c ph?m chin. Thng tin ny khng nh?m m?c ?ch thay th? cho l?i khuyn m chuyn gia ch?m [place] s?c kh?e ni v?i qu v?. Hy b?o ??m qu v? ph?i th?o lu?n b?t k? v?n ?? g m qu v? c v?i chuyn gia ch?m [place] s?c kh?e c?a qu v?. Document Released: 06/09/2016 Document Revised: 06/09/2016 Elsevier Interactive Patient Education  2018 [place] for Gastroesophageal Reflux Disease, Adult When you have gastroesophageal reflux disease (GERD), the foods you eat and your eating habits are very important. Choosing the right foods can help ease your discomfort. What guidelines do I need to follow?  Choose fruits, vegetables, whole grains, and low-fat dairy products.  Choose low-fat meat, fish, and poultry.  Limit fats such as oils, salad dressings, butter, nuts, and avocado.  Keep a food diary. This helps you identify foods that cause symptoms.  Avoid foods that cause symptoms. These may be different for everyone.  Eat small meals  often instead of 3 large meals a day.  Eat your meals slowly, in a place where you are relaxed.  Limit fried  foods.  Cook foods using methods other than frying.  Avoid drinking alcohol.  Avoid drinking large amounts of liquids with your meals.  Avoid bending over or lying down until 2-3 hours after eating. What foods are not recommended? These are some foods and drinks that may make your symptoms worse: Vegetables Tomatoes. Tomato juice. Tomato and spaghetti sauce. Chili peppers. Onion and garlic. Horseradish. Fruits Oranges, grapefruit, and lemon (fruit and juice). Meats High-fat meats, fish, and poultry. This includes hot dogs, ribs, ham, sausage, salami, and bacon. Dairy Whole milk and chocolate milk. Sour cream. Cream. Butter. Ice cream. Cream cheese. Drinks Coffee and tea. Bubbly (carbonated) drinks or energy drinks. Condiments Hot sauce. Barbecue sauce. Sweets/Desserts Chocolate and cocoa. Donuts. Peppermint and spearmint. Fats and Oils High-fat foods. This includes [place] fries and potato chips. Other Vinegar. Strong spices. This includes black pepper, white pepper, red pepper, cayenne, curry powder, cloves, ginger, and chili powder. The items listed above may not be a complete list of foods and drinks to avoid. Contact your dietitian for more information. This information is not intended to replace advice given to you by your health care provider. Make sure you discuss any questions you have with your health care provider. Document Released: 08/22/2011 Document Revised: 07/29/2015 Document Reviewed: 12/25/2012 Elsevier Interactive Patient Education  2017 [employer].     IF you received an x-ray today, you will receive an invoice from [facility]. Please contact [facility] at [phone] with questions or concerns regarding your invoice.   IF you received labwork today, you will receive an invoice from [place]. Please contact [facility] at [phone] with questions or concerns regarding your invoice.   Our billing staff will not be able to assist you  with questions regarding bills from these companies.  You will be contacted with the lab results as soon as they are available. The fastest way to get your results is to activate your My Chart account. Instructions are located on the last page of this paperwork. If you have not heard from [place] regarding the results in 2 weeks, please contact this office.

## 2017-10-17 LAB — H. PYLORI BREATH TEST: H PYLORI BREATH TEST: NEGATIVE

## 2017-10-17 LAB — H. PYLORI BREATH COLLECTION

## 2017-10-27 ENCOUNTER — Telehealth: Payer: Self-pay | Admitting: Urgent Care

## 2017-10-27 NOTE — Telephone Encounter (Signed)
Pt's daughter called stating that her mother still is not better.  Cristina Boyd had mentioned that he would like her to have an endoscopy done if she is not better.  She states that she is still in a lot of pain.  Please advise

## 2017-10-29 ENCOUNTER — Other Ambulatory Visit: Payer: Self-pay | Admitting: Physician Assistant

## 2017-10-29 DIAGNOSIS — R1013 Epigastric pain: Secondary | ICD-10-CM

## 2017-10-29 NOTE — Telephone Encounter (Signed)
Referral to endoscopy center placed. Please let pt know she will receive a phone call to schedule this.

## 2017-10-31 ENCOUNTER — Encounter: Payer: Self-pay | Admitting: Gastroenterology

## 2017-10-31 ENCOUNTER — Ambulatory Visit: Payer: BLUE CROSS/BLUE SHIELD | Admitting: Gastroenterology

## 2017-10-31 VITALS — BP 94/60 | HR 64 | Ht 62.5 in | Wt 118.0 lb

## 2017-10-31 DIAGNOSIS — R634 Abnormal weight loss: Secondary | ICD-10-CM | POA: Diagnosis not present

## 2017-10-31 DIAGNOSIS — R1013 Epigastric pain: Secondary | ICD-10-CM

## 2017-10-31 DIAGNOSIS — K219 Gastro-esophageal reflux disease without esophagitis: Secondary | ICD-10-CM

## 2017-10-31 NOTE — Progress Notes (Signed)
Cristina Boyd    [id number]    11-Nov-1970  Primary Care Physician:Patient, No Pcp Per  Referring Physician: Dorise Hiss, PA-C [place], [place] [address]  Chief complaint: Abdominal pain epigastric  HPI: 47 year old female accompanied by her daughter with complaints of epigastric abdominal pain x1 month worse.  She was started on omeprazole 20 mg twice daily with some improvement of heartburn and discomfort in her throat but has not noticed any change or improvement of the epigastric abdominal pain.  She thinks she may have lost about 2 or 3 pounds in the past month.  Reports decreased appetite and intermittent nausea but no vomiting, dysphagia or odynophagia.  H. pylori negative.  Denies any NSAIDs.  Denies any change in bowel habits, melena or blood per rectum. No family history of GI malignancy.   Outpatient Encounter Medications as of 10/31/2017  Medication Sig  . omeprazole (PRILOSEC) 20 MG capsule Take 1 capsule (20 mg total) by mouth 2 (two) times daily before a meal.  . ranitidine (ZANTAC) 150 MG tablet Take 1 tablet (150 mg total) by mouth 2 (two) times daily.   No facility-administered encounter medications on file as of 10/31/2017.     Allergies as of 10/31/2017  . (No Known Allergies)    Past Medical History:  Diagnosis Date  . Fibroadenoma     No past surgical history on file.  Family History  Problem Relation Age of Onset  . Hypertension Mother   . Stroke Mother 72       mild CVA/TIA    Social History   Socioeconomic History  . Marital status: Married    Spouse name: Not on file  . Number of children: Not on file  . Years of education: Not on file  . Highest education level: Not on file  Occupational History  . Not on file  Social Needs  . Financial resource strain: Not on file  . Food insecurity:    Worry: Not on file    Inability: Not on file  . Transportation needs:    Medical: Not on file    Non-medical: Not on  file  Tobacco Use  . Smoking status: Never Smoker  . Smokeless tobacco: Never Used  Substance and Sexual Activity  . Alcohol use: No  . Drug use: No  . Sexual activity: Yes    Birth control/protection: IUD  Lifestyle  . Physical activity:    Days per week: Not on file    Minutes per session: Not on file  . Stress: Not on file  Relationships  . Social connections:    Talks on phone: Not on file    Gets together: Not on file    Attends religious service: Not on file    Active member of club or organization: Not on file    Attends meetings of clubs or organizations: Not on file    Relationship status: Not on file  . Intimate partner violence:    Fear of current or ex partner: Not on file    Emotionally abused: Not on file    Physically abused: Not on file    Forced sexual activity: Not on file  Other Topics Concern  . 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 Positive for fatigue HENT: Negative.   Eyes: Negative for blurred vision.  Respiratory: Negative for cough, shortness of breath and wheezing.   Cardiovascular: Negative for chest pain and palpitations.  Gastrointestinal: as per HPI Genitourinary: Negative for dysuria, urgency, frequency and hematuria.  Musculoskeletal: Negative for myalgias, back pain and joint pain.  Skin: Negative for itching and rash.  Neurological: Negative for dizziness, tremors, focal weakness, seizures and loss of consciousness. Positive for anxiety Endo/Heme/Allergies: Negative for seasonal allergies.    Psychiatric/Behavioral: Negative for depression, suicidal ideas and hallucinations.  All other systems reviewed and are negative.   Physical Exam: Vitals:   10/31/17 1044  BP: 94/60  Pulse: 64   Body mass index is 21.24 kg/m. Gen:      No acute distress HEENT:  EOMI, sclera anicteric Neck:     No masses; no thyromegaly Lungs:    Clear to auscultation bilaterally; normal respiratory effort CV:         Regular rate and rhythm; no murmurs Abd:      + bowel sounds; soft, non-tender; no palpable masses, no distension Ext:    No edema; adequate peripheral perfusion Skin:      Warm and dry; no rash Neuro: alert and oriented x 3 Psych: normal mood and affect  Data Reviewed:  Reviewed labs, radiology imaging, old records and pertinent past GI work up   Assessment and Plan/Recommendations:  47 year old female here with complaints of severe epigastric abdominal discomfort for past 1 month associated with nausea and heartburn Epigastric abdominal pain Schedule for EGD to exclude gastritis/peptic ulcer disease Will obtain CMP, CBC and lipase Abdominal ultrasound to exclude gallbladder disease  Nausea and heartburn likely secondary to GERD Advised patient to take omeprazole 40 mg daily, 30 minutes before breakfast and use Zantac 150 mg at bedtime Antireflux measures and lifestyle modifications  Dicyclomine 10 mg every 6 hours as needed for abdominal discomfort  The risks and benefits as well as alternatives of endoscopic procedure(s) have been discussed and reviewed. All questions answered. The patient agrees to proceed.   Damaris Hippo , MD [phone]    CC: McVey, Letta Median*

## 2017-10-31 NOTE — Patient Instructions (Signed)
Go to the basement for labs today  Continue omeprazole Take 40 mg daily in the morning before breakfast  Use zantac 150 mg at bedtime  Take dicyclomine 10 mg every 6 hours as needed, we will send this in as a prescription  You have been scheduled for an abdominal ultrasound at [facility] (1st floor of hospital) on 11/01/2017 at 12:30pm. Please arrive 15 minutes prior to your appointment for registration. Make certain not to have anything to eat or drink 6 hours prior to your appointment. Should you need to reschedule your appointment, please contact radiology at [phone]. This test typically takes about 30 minutes to perform.  You have been scheduled for an endoscopy. Please follow written instructions given to you at your visit today. If you use inhalers (even only as needed), please bring them with you on the day of your procedure.

## 2017-11-01 ENCOUNTER — Ambulatory Visit (HOSPITAL_COMMUNITY)
Admission: RE | Admit: 2017-11-01 | Discharge: 2017-11-01 | Disposition: A | Discharge status: Home / Self Care | Payer: BLUE CROSS/BLUE SHIELD | Source: Ambulatory Visit | Attending: Gastroenterology | Admitting: Gastroenterology

## 2017-11-01 DIAGNOSIS — K824 Cholesterolosis of gallbladder: Secondary | ICD-10-CM | POA: Insufficient documentation

## 2017-11-01 DIAGNOSIS — R1013 Epigastric pain: Secondary | ICD-10-CM

## 2017-11-01 DIAGNOSIS — J029 Acute pharyngitis, unspecified: Secondary | ICD-10-CM | POA: Diagnosis not present

## 2017-11-01 DIAGNOSIS — Z6821 Body mass index (BMI) 21.0-21.9, adult: Secondary | ICD-10-CM | POA: Insufficient documentation

## 2017-11-01 DIAGNOSIS — R634 Abnormal weight loss: Secondary | ICD-10-CM | POA: Insufficient documentation

## 2017-11-02 ENCOUNTER — Encounter: Payer: Self-pay | Admitting: Gastroenterology

## 2017-11-07 ENCOUNTER — Encounter: Payer: Self-pay | Admitting: Gastroenterology

## 2017-11-07 ENCOUNTER — Ambulatory Visit (AMBULATORY_SURGERY_CENTER): Payer: BLUE CROSS/BLUE SHIELD | Admitting: Gastroenterology

## 2017-11-07 VITALS — BP 103/69 | HR 77 | Temp 98.7°F | Resp 18 | Ht 62.5 in | Wt 118.0 lb

## 2017-11-07 DIAGNOSIS — K2970 Gastritis, unspecified, without bleeding: Secondary | ICD-10-CM

## 2017-11-07 DIAGNOSIS — R1013 Epigastric pain: Secondary | ICD-10-CM | POA: Diagnosis not present

## 2017-11-07 MED ORDER — SODIUM CHLORIDE 0.9 % IV SOLN: 500.0000 mL | Freq: Once | INTRAVENOUS | Status: DC

## 2017-11-07 NOTE — Patient Instructions (Signed)
YOU HAD AN ENDOSCOPIC PROCEDURE TODAY AT [facility]:   Refer to the procedure report that was given to you for any specific questions about what was found during the examination.  If the procedure report does not answer your questions, please call your gastroenterologist to clarify.  If you requested that your care partner not be given the details of your procedure findings, then the procedure report has been included in a sealed envelope for you to review at your convenience later.  YOU SHOULD EXPECT: Some feelings of bloating in the abdomen. Passage of more gas than usual.  Walking can help get rid of the air that was put into your GI tract during the procedure and reduce the bloating. If you had a lower endoscopy (such as a colonoscopy or flexible sigmoidoscopy) you may notice spotting of blood in your stool or on the toilet paper. If you underwent a bowel prep for your procedure, you may not have a normal bowel movement for a few days.  Please Note:  You might notice some irritation and congestion in your nose or some drainage.  This is from the oxygen used during your procedure.  There is no need for concern and it should clear up in a day or so.  SYMPTOMS TO REPORT IMMEDIATELY:   Following upper endoscopy (EGD)  Vomiting of blood or coffee ground material  New chest pain or pain under the shoulder blades  Painful or persistently difficult swallowing  New shortness of breath  Fever of 100F or higher  Black, tarry-looking stools  For urgent or emergent issues, a gastroenterologist can be reached at any hour by calling [phone].   DIET:  We do recommend a small meal at first, but then you may proceed to your regular diet.  Drink plenty of fluids but you should avoid alcoholic beverages for 24 hours.  ACTIVITY:  You should plan to take it easy for the rest of today and you should NOT DRIVE or use heavy machinery until tomorrow (because of the sedation medicines used  during the test).    FOLLOW UP: Our staff will call the number listed on your records the next business day following your procedure to check on you and address any questions or concerns that you may have regarding the information given to you following your procedure. If we do not reach you, we will leave a message.  However, if you are feeling well and you are not experiencing any problems, there is no need to return our call.  We will assume that you have returned to your regular daily activities without incident.  If any biopsies were taken you will be contacted by phone or by letter within the next 1-3 weeks.  Please call us at [phone] if you have not heard about the biopsies in 3 weeks.   Await for results Office will call you follow up with a two month appointment,in 6 months follow up with abdominal ultrasound   SIGNATURES/CONFIDENTIALITY: You and/or your care partner have signed paperwork which will be entered into your electronic medical record.  These signatures attest to the fact that that the information above on your After Visit Summary has been reviewed and is understood.  Full responsibility of the confidentiality of this discharge information lies with you and/or your care-partner.

## 2017-11-07 NOTE — Progress Notes (Signed)
Called to room to assist during endoscopic procedure.  Patient ID and intended procedure confirmed with present staff. Received instructions for my participation in the procedure from the performing physician.  

## 2017-11-07 NOTE — Op Note (Signed)
[facility] Patient Name: Cristina Boyd Procedure Date: 11/07/2017 10:19 AM MRN: [id number] Endoscopist: Mauri Pole , MD Age: 47 Referring MD:  Date of Birth: Jul 12, 1970 Gender: Female Account #: [id number] Procedure:                Upper GI endoscopy Indications:              Epigastric abdominal pain, Dyspepsia Medicines:                Monitored Anesthesia Care Procedure:                Pre-Anesthesia Assessment:                           - Prior to the procedure, a History and Physical                            was performed, and patient medications and                            allergies were reviewed. The patient's tolerance of                            previous anesthesia was also reviewed. The risks                            and benefits of the procedure and the sedation                            options and risks were discussed with the patient.                            All questions were answered, and informed consent                            was obtained. Prior Anticoagulants: The patient has                            taken no previous anticoagulant or antiplatelet                            agents. ASA Grade Assessment: II - A patient with                            mild systemic disease. After reviewing the risks                            and benefits, the patient was deemed in                            satisfactory condition to undergo the procedure.                           After obtaining informed consent, the endoscope was  passed under direct vision. Throughout the                            procedure, the patient's blood pressure, pulse, and                            oxygen saturations were monitored continuously. The                            Endoscope was introduced through the mouth, and                            advanced to the second part of duodenum. The upper                            GI endoscopy was  accomplished without difficulty.                            The patient tolerated the procedure well. Scope In: Scope Out: Findings:                 The Z-line was regular and was found 35 cm from the                            incisors.                           The examined esophagus was normal.                           Patchy mild inflammation with hemorrhage                            characterized by adherent blood, congestion                            (edema), erythema and mucus was found in the                            gastric antrum. Biopsies were taken with a cold                            forceps for Helicobacter pylori testing using                            CLOtest.                           The examined duodenum was normal. Complications:            No immediate complications. Estimated Blood Loss:     Estimated blood loss was minimal. Impression:               - Z-line regular, 35 cm from the incisors.                           -  Normal esophagus.                           - Gastritis with hemorrhage. Biopsied.                           - Normal examined duodenum. Recommendation:           - Patient has a contact number available for                            emergencies. The signs and symptoms of potential                            delayed complications were discussed with the                            patient. Return to normal activities tomorrow.                            Written discharge instructions were provided to the                            patient.                           - Resume previous diet.                           - Continue present medications.                           - Await pathology results.                           - Follow up in office visit in 2 months                           - Follow up abdominal ultrasound in 6 months,                            multiple gallbladder polyps (Informed patient                             results) Mauri Pole, MD 11/07/2017 10:35:18 AM This report has been signed electronically.

## 2017-11-07 NOTE — Progress Notes (Signed)
A and O x3. Report to RN. Tolerated MAC anesthesia well.

## 2017-11-08 ENCOUNTER — Telehealth: Payer: Self-pay | Admitting: *Deleted

## 2017-11-08 LAB — HELICOBACTER PYLORI SCREEN-BIOPSY: UREASE: NEGATIVE

## 2017-11-08 NOTE — Telephone Encounter (Signed)
  Follow up Call-  Call back number 11/07/2017  Post procedure Call Back phone  # [phone]  Permission to leave phone message Yes  Some recent data might be hidden     Patient questions:  Do you have a fever, pain , or abdominal swelling? Yes.   Pain Score  2 *  Have you tolerated food without any problems? Yes.    Have you been able to return to your normal activities? Yes.    Do you have any questions about your discharge instructions: Diet   No. Medications  No. Follow up visit  No.  Do you have questions or concerns about your Care? No.  Actions: * If pain score is 4 or above: No action needed, pain <4.

## 2017-12-14 ENCOUNTER — Other Ambulatory Visit: Payer: Self-pay | Admitting: Urgent Care

## 2018-01-02 ENCOUNTER — Ambulatory Visit: Payer: BLUE CROSS/BLUE SHIELD | Admitting: Gastroenterology

## 2018-04-11 ENCOUNTER — Encounter: Payer: Self-pay | Admitting: Family Medicine

## 2018-04-11 ENCOUNTER — Ambulatory Visit: Payer: BLUE CROSS/BLUE SHIELD | Admitting: Family Medicine

## 2018-04-11 ENCOUNTER — Other Ambulatory Visit: Payer: Self-pay

## 2018-04-11 VITALS — BP 107/61 | HR 72 | Temp 98.6°F | Ht 64.0 in | Wt 123.8 lb

## 2018-04-11 DIAGNOSIS — N926 Irregular menstruation, unspecified: Secondary | ICD-10-CM | POA: Diagnosis not present

## 2018-04-11 DIAGNOSIS — R1013 Epigastric pain: Secondary | ICD-10-CM | POA: Diagnosis not present

## 2018-04-11 DIAGNOSIS — N946 Dysmenorrhea, unspecified: Secondary | ICD-10-CM | POA: Diagnosis not present

## 2018-04-11 DIAGNOSIS — N920 Excessive and frequent menstruation with regular cycle: Secondary | ICD-10-CM | POA: Insufficient documentation

## 2018-04-11 DIAGNOSIS — K824 Cholesterolosis of gallbladder: Secondary | ICD-10-CM | POA: Insufficient documentation

## 2018-04-11 DIAGNOSIS — Z09 Encounter for follow-up examination after completed treatment for conditions other than malignant neoplasm: Secondary | ICD-10-CM

## 2018-04-11 LAB — POCT URINALYSIS DIP (MANUAL ENTRY)
Bilirubin, UA: NEGATIVE
Glucose, UA: NEGATIVE mg/dL
Ketones, POC UA: NEGATIVE mg/dL
Leukocytes, UA: NEGATIVE
Nitrite, UA: NEGATIVE
Protein Ur, POC: NEGATIVE mg/dL
Spec Grav, UA: 1.01 (ref 1.010–1.025)
Urobilinogen, UA: 0.2 E.U./dL
pH, UA: 6.5 (ref 5.0–8.0)

## 2018-04-11 LAB — POCT URINE PREGNANCY: Preg Test, Ur: NEGATIVE

## 2018-04-11 MED ORDER — NAPROXEN 500 MG PO TABS: 500.0000 mg | ORAL_TABLET | Freq: Two times a day (BID) | ORAL | 1 refills | Status: DC

## 2018-04-11 NOTE — Patient Instructions (Addendum)
If you have lab work done today you will be contacted with your lab results within the next 2 weeks.  If you have not heard from [place] then please contact us. The fastest way to get your results is to register for My Chart.   IF you received an x-ray today, you will receive an invoice from [facility]. Please contact [facility] at [phone] with questions or concerns regarding your invoice.   IF you received labwork today, you will receive an invoice from [facility]. Please contact [facility] at [phone] with questions or concerns regarding your invoice.   Our billing staff will not be able to assist you with questions regarding bills from these companies.  You will be contacted with the lab results as soon as they are available. The fastest way to get your results is to activate your My Chart account. Instructions are located on the last page of this paperwork. If you have not heard from [place] regarding the results in 2 weeks, please contact this office.       Rong kinh Menorrhagia  Rong kinh l tnh tr?ng trong ? k? kinh nguy?t n?ng ho?c ko di h?n bnh th??ng. V?i rong kinh, h?u h?t chu k? kinh nguy?t c?a m?t ng??i ph? n? ??u c th? gy m?t mu v co th?t ?? ?? ng??i ph? n? ? khng th? tham gia vo cc ho?t ??ng th??ng xuyn c?a mnh. Nguyn nhn g gy ra? Nguyn nhn ph? bi?n gy ra tnh tr?ng ny bao g?m:  Nh?ng kh?i u khng ph?i ung th? trong t? cung (polip hay u x? t? cung).  M?t cn b?ng hc mn estrogen v progesterone.  M?t trong hai bu?ng tr?ng c?a qu v? khng r?ng tr?ng trong m?t ho?c nhi?u thng.  V?n ?? v?i tuy?n gip (suy gip).  Tc d?ng ph? c?a ??t vng trnh [place] trong t? cung (IUD).  Tc d?ng ph? c?a m?t s? lo?i thu?c, ch?ng h?n nh? cc lo?i thu?c ch?ng vim ho?c thu?c lm long mu.  R?i lo?n ch?y mu lm mu khng ?ng ???c bnh th??ng. Trong nhi?u tr??ng h?p, khng r nguyn nhn gy ra tnh tr?ng ny. Cc d?u hi?u ho?c tri?u  ch?ng l g? Nh?ng tri?u ch?ng c?a tnh tr?ng ny bao g?m:  Th??ng ph?i thay b?ng ho?c nt b?ng v? sinh 1-2 gi? m?t l?n v b?ng b? th?m ??t hon ton.  C?n s? d?ng b?ng v nt v? sinh cng m?t lc v ch?y mu nhi?u.  C?n ph?i th?c d?y ?? thay b?ng ho?c nt v? sinh vo ban ?m.  Ra cc c?c mu ?ng l?n h?n 1 ins? (2,5 cm).  Ch?y mu ko di h?n 7 ngy.  C tri?u ch?ng c?a n?ng ?? s?t th?p (b?nh thi?u mu), ch?ng h?n nh? m?t m?i, ki?t s?c, ho?c kh th?. Ch?n ?on tnh tr?ng ny nh? th? no? Tnh tr?ng ny c th? ???c ch?n ?on d?a vo:  Khm th?c th?.  Tri?u ch?ng v ti?n s? kinh nguy?t c?a qu v?.  Xt nghi?m, ch?ng h?n nh?: ? Xt nghi?m mu ?? ki?m tra xem qu v? c ?ang mang thai ho?c c thay ??i hc mn, r?i lo?n ch?y mu ho?c r?i lo?n tuy?n gip, b?nh thi?u mu, ho?c cc v?n ?? khc khng. ? Xt nghi?m ph?t t? bo c? t? cung (pap) ?? ki?m tra xem c thay ??i d?ng ung th?, nhi?m trng, hay vim nhi?m g khng. ? Sinh thi?t n?i m?c t? cung. Xt nghi?m ny l?y  m?t m?u m t? nim m?c t? cung (n?i m?c t? cung) ?? ki?m tra d??i knh hi?n vi. ? Siu m vng khung ch?u. Ki?m tra ny s? d?ng sng m ?? t?o hnh ?nh t? cung, bu?ng tr?ng v m ??o c?a qu v?. Nh?ng hnh ?nh ny c th? cho bi?t li?u qu v? c u x? hay cc kh?i u khc hay khng. ? Soi t? cung. ??i v?i ki?m tra ny, m?t chi?c knh soi nh? s? ???c s? d?ng ?? nhn vo bn trong t? cung. Tnh tr?ng ny ???c ?i?u tr? nh? th? no? Tnh tr?ng ny c th? khng c?n ?i?u tr?. N?u c?n thi?t, ?i?u tr? t?t nh?t cho qu v? s? ph? thu?c vo:  Li?u qu v? c c?n ph?i trnh thai hay khng.  Mong mu?n c?a qu v? v? vi?c c con trong t??ng lai.  Nguyn nhn v m?c ?? n?ng c?a tnh tr?ng ch?y mu.  ?u tin c nhn c?a qu v?. Thu?c l b??c ??u tin trong ?i?u tr?Sander Nephew v? c th? ???c ?i?u tr? b?ng:  Bi?n php trnh [place] b?ng hc mn. Cc ph??ng php ?i?u tr? lm gi?m ch?y mu trong th?i k? kinh nguy?t c?a qu v?. Cc bi?n php ny bao  g?m: ? Thu?c vin trnh [place]. ? Mi?ng dn Training and development officer. ? Vng ??t m ??o. ? Cc m?i tim (tim) 3 thng m?t l?n. ? IUD hc mn (vng trnh [place] trong t? cung). ? Que c?y d??i da.  Cc lo?i thu?c lm mu ??c l?i v ch?y mu ch?m l?i.  Cc lo?i thu?c lm gi?m s?ng, ch?ng h?n nh? ibuprofen.  Cc lo?i thu?c c ch?a hc mn nhn t?o (t?ng h?p) c tn l progestin.  Cc lo?i thu?c lm cho bu?ng tr?ng ng?ng ho?t ??ng trong m?t th?i gian ng?n.  B? sung s?t ?? ?i?u tr? b?nh thi?u mu. N?u thu?c khng c tc d?ng, c th? th?c hi?n ph?u thu?t. Cc ty ch?n ph?u thu?t c th? bao g?m:  Nong v n?o (D&C). Trong th? thu?t ny, chuyn gia ch?m [place] s?c kh?e c?a qu v? s? m? (nong r?ng) c? t? cung c?a qu v? v sau ? n?o ho?c ht m t? n?i m?c t? cung ?? gi?m ch?y mu do kinh nguy?t.  N?i soi t? cung trong ph?u thu?t. Trong th? thu?t ny, m?t ?ng nh? c ?n trn ??u (?ng n?i soi t? cung) ???c s? d?ng ?? quan st t? cung c?a qu v? v gip lo?i b? cc polip c th? ?ang gy ra nh?ng k? kinh nguy?t n?ng.  C?t b? n?i m?c t? cung. ?y l bi?n php s? d?ng nhi?u k? thu?t ?? ph h?y v?nh vi?n ton b? n?i m?c t? cung c?a qu v?. Sau khi c?t b? n?i m?c t? cung, h?u h?t ph? n? c t ho?c khng c hnh kinh. Th? thu?t ny c?ng lm gi?m kh? n?ng qu v? mang [place].  C?t b? n?i m?c t? cung. ? th? thu?t ny, m?t vng b?ng kim lo?i ph?u thu?t ?i?n ???c s? d?ng ?? lo?i b? n?i m?c t? cung. Th? thu?t ny c?ng lm gi?m kh? n?ng qu v? mang [place].  C?t b? t? cung. ?y l ph?u thu?t c?t b? t? cung. ?y l th? thu?t v?nh vi?n ch?m d?t cc k? kinh nguy?t. Khng th? mang thai sau khi c?t b? t? cung. Tun th? nh?ng h??ng d?n ny ? nh: Thu?c  S? d?ng thu?c khng k ??n v thu?c k ??n theo ?ng ch? d?n  c?a chuyn gia ch?m [place] s?c kh?e. Vi?c ny bao g?m c? vin s?t.  Khng thay ??i ho?c chuy?n lo?i thu?c m khng tham kh?o  ki?n c?a chuyn gia ch?m [place] s?c kh?e.  Khng s? d?ng trinh ho?c thu?c c ch?a aspirin tr??c khi hnh kinh 1  tu?n ho?c trong k? kinh nguy?t. Aspirin c th? lm cho ch?y mu tr?m tr?ng h?n. H??ng d?n chung  N?u qu v? c?n thay b?ng ho?c nt v? sinh nhi?u h?n 2 gi? m?t l?n, hy h?n ch? ho?t ??ng cho ??n khi ng?ng ch?y mu.  Vin s?t c th? gy to bn. ?? phng ng?a ho?c ?i?u tr? to bn trong khi qu v? ?ang dng thu?c b? sung s?t theo ??n, chuyn gia ch?m [place] s?c kh?e c?a qu v? c th? khuy?n ngh? qu v?: ? U?ng ?? n??c ?? gi? cho n??c ti?u trong ho?c c mu vng nh?t. ? Dng thu?c khng k ??n ho?c k ??n. ? ?n th?c ?n giu ch?t x? nh? tri cy t??i v rau, ng? c?c nguyn h?t v cc lo?i ??u. ? H?n ch? cc lo?i th?c ?n giu ch?t bo v ???ng tinh luy?n, ch?ng h?n nh? ?? ?n chin rn v ?? ng?t.  ?n cc mn ?n cn b?ng, g?m c? cc th?c ph?m giu s?t. Cc th?c ph?m ch?a nhi?u s?t nh? rau xanh nhi?u l, th?t, gan, tr?ng v bnh m v ng? c?c nguyn h?t.  Khng c? g?ng gi?m cn cho ??n khi ch?y mu b?t th??ng ? d?ng l?i v hm l??ng s?t trong mu c?a qu v? tr? l?i bnh th??ng. N?u qu v? c?n gi?m cn, hy trao ??i v?i chuyn gia ch?m [place] s?c kh?e c?a qu v? ?? gi?m cn m?t cch an ton.  Tun th? t?t c? cc l?n khm theo di theo ch? d?n c?a chuyn gia ch?m [place] s?c kh?e. ?i?u ny c vai tr quan tr?ng. Hy lin l?c v?i chuyn gia ch?m [place] s?c kh?e n?u:  Qu v? b? ??t ??m b?ng ho?c nt v? sinh 1 ho?c 2 gi? m?t l?n v ?i?u ny x?y ra vo m?i l?n hnh kinh c?a qu v?.  Qu v? c?n s? d?ng b?ng v nt v? sinh cng m?t lc v qu v? b? ch?y mu qu nhi?u.  Qu v? bu?n nn, nn m?a, tiu ch?y, ho?c cc v?n ?? khc lin quan ??n cc lo?i thu?c m qu v? ?ang dng. Yu c?u tr? gip ngay l?p t?c n?u:  Qu v? lm ??t ??m h?n m?t b?ng ho?c nt v? sinh trong 1 gi?Sander Nephew v? th?y ra cc c?c mu ?ng r?ng h?n 1 in s? (2,5 cm).  Qu v? th?y kh th?.  Qu v? c?m th?y nh? tim ??p qu nhanh.  Qu v? c?m th?y chng m?t ho?c ng?t x?u.  Qu v? c?m th?y r?t y?u ho?c m?t m?i. Tm t?t  Rong kinh l tnh tr?ng  trong ? k? kinh nguy?t n?ng ho?c ko di h?n bnh th??ng.  Vi?c ?i?u tr? s? ph? thu?c vo nguyn nhn gy ra tnh tr?ng ny v c th? bao g?m thu?c ho?c th? thu?t.  S? d?ng thu?c khng k ??n v thu?c k ??n theo ?ng ch? d?n c?a chuyn gia ch?m [place] s?c kh?e. Vi?c ny bao g?m c? vin s?t.  Yu c?u tr? gip ngay l?p t?c n?u qu v? ch?y mu nhi?u lm ??t ??m h?n m?t b?ng ho?c nt v? sinh trong 1 gi?, qu v? th?y ra cc c?c mu ?  ng l?n, ho?c qu v? c?m th?y chng m?t, ng?t x?u ho?c kh th?. Thng tin ny khng nh?m m?c ?ch thay th? cho l?i khuyn m chuyn gia ch?m [place] s?c kh?e ni v?i qu v?. Hy b?o ??m qu v? ph?i th?o lu?n b?t k? v?n ?? g m qu v? c v?i chuyn gia ch?m [place] s?c kh?e c?a qu v?. Document Released: 03/19/2015 Document Revised: 06/08/2016 Document Reviewed: 06/08/2016 Elsevier Interactive Patient Education  2019 Elsevier Inc.  ?au b?ng kinh Dysmenorrhea  ?au b?ng kinh ?? c?p ??n nh?ng c?n co th?t do c? t? cung th?t l?i (co rt) trong k? kinh gy ra. ?au b?ng kinh c th? nh?, ho?c ?? n?ng ?? ?nh h??ng ??n sinh ho?t hng ngy trong vi ngy m?i thng. ?au b?ng kinh nguyn pht l co th?t trong k? kinh nguy?t ko di vi ngy khi qu v? b?t ??u c kinh nguy?t ho?c ngay sau ?Marland Kitchen Tnh tr?ng ny th??ng b?t ??u sau khi m?t b gi b?t ??u c kinh nguy?t. Khi ng??i ph? n? l?n h?n ho?c c m?t con, cc c?n co th?t th??ng s? gi?m b?t ho?c bi?n m?t. ?au b?ng kinh th? pht b?t ??u mu?n h?n trong cu?c ??i v do r?i lo?n h? sinh s?n gy ra. N ko di h?n v n c th? gy ?au h?n so v?i ?au b?ng kinh nguyn pht. C?n ?au c th? b?t ??u tr??c k? kinh nguy?t v m?t vi ngy sau kinh nguy?t. Nguyn nhn g gy ra? ?au b?ng kinh th??ng do m?t v?n ?? bn trong gy ra, ch?ng h?n nh?:  M lt t? cung (n?i m?c t? cung) pht tri?n bn ngoi t? cung ? cc khu v?c khc c?a c? th? (l?c n?i m?c t? cung).  M n?i m?c t? cung pht tri?n vo cc thnh c? c?a t? cung (l?c n?i m?c trong c? t? cung).  Cc  m?ch mu trong khung ch?u tr? ln ??y mu ngay tr??c k? kinh (h?i ch?ng xung huy?t vng x??ng ch?u).  Pht tri?n qu m?c cc t? bo (polyp) trong n?i m?c t? cung ho?c ? ph?n d??i c?a t? cung (c? t? cung).  T? cung r?i xu?ng m ??o (sa) do c? b? y?u ho?c ko gin.  V?n ?? v? bng quang, ch?ng h?n nh? nhi?m trng ho?c vim.  Cc v?n ?? v? ru?t, ch?ng h?n nh? kh?i u ho?c h?i ch?ng ru?t kch thch.  Ung th? bng quang ho?c c? quan sinh s?n.  T? cung ng? v? sau qu m?c.  C? t? cung ?ng ho?c c l? m? r?t nh?.  Kh?i u khng ph?i ung th? (lnh tnh) c?a t? cung (u x?).  B?nh vim vng ch?u (PID).  S?o vng ch?u (dnh) t? ph?u thu?t tr??c.  U nang bu?ng tr?ng.  IUD (vng trnh [place] trong t? cung). ?i?u g lm t?ng nguy c?? Qu v? d? b? tnh tr?ng ny h?n n?u:  Qu v? d??i 30 tu?i.  Qu v? b?t ??u d?y th s?m.  Qu v? b? ch?y mu khng ??u ho?c nhi?u.  Qu v? ch?a bao gi? sinh con.  Qu v? c ti?n s? gia ?nh ?au b?ng kinh.  Qu v? ht thu?c l. Cc d?u hi?u ho?c tri?u ch?ng l g? Nh?ng tri?u ch?ng c?a tnh tr?ng ny bao g?m:  Co th?t, ?au nhi, ho?c c?m th?y ??y b?ng d??i.  ?au vng th?t l?ng.  Kinh nguy?t ko di h?n 7 ngy.  ?au ??u.  ??y h?i.  M?t m?i.  Bu?n nn ho?c nn.  Tiu ch?y.  V m? hi ho?c chng m?t.  Phn l?ng. Ch?n ?on tnh tr?ng ny nh? th? no? Tnh tr?ng ny c th? ???c ch?n ?on d?a vo:  Tri?u ch?ng c?a qu v?.  B?nh s? c?a qu v?.  Khm th?c th?.  Xt nghi?m mu.  Xt nghi?m [place]. ?y l xt nghi?m trong ? cc t? bo t? c? t? cung ???c xt nghi?m ?? tm d?u hi?u ung th? ho?c nhi?m trng.  Xt nghi?m th? [place].  Ca?c ki?m tra hnh ?nh, nh?: ? Siu m. ? Th? thu?t ?? lo?i b? v ki?m tra m?u m n?i m?c t? cung (nong v n?o, D&C). ? Th? thu?t ?? ki?m tra b?ng m?t th??ng ph?n bn trong c?a:  T? cung (soi t? cung).  B?ng ho?c khung ch?u (soi ? b?ng).  Bng quang (n?i soi bng quang).  Ru?t (n?i soi ??i trng).  D? dy  (Soi d? dy). ? Ch?p X quang. ? Ch?p CT. ? Ch?p MRI. Tnh tr?ng ny ???c ?i?u tr? nh? th? no? ?i?u tr? ph? thu?c vo nguyn nhn gy ?au b?ng kinh. ?i?u tr? c th? bao g?m:  Thu?c gi?m ?au do chuyn gia ch?m [place] s?c kh?e k ??n.  Vin thu?c trnh [place] ch?a hc mn progesterone.  IUD ch?a hc mn progesterone.  Thu?c ki?m sot ch?y mu.  Li?u php thay th? hc mn.  Thu?c ch?ng vim khng steroid (NSAID). Nh?ng lo?i thu?c ny c th? gip ng?n s?n xu?t hc mn gy co th?t.  Thu?c ch?ng tr?m c?m.  Ph?u thu?t ?? lo?i b? cc ch? dnh, l?c n?i m?c t? cung, u nang bu?ng tr?ng, u x?, ho?c ton b? t? cung (c?t b? t? cung).  Tim progesterone ?? d?ng k? kinh.  Th? thu?t ?? ph h?y n?i m?c t? cung (c?t b? n?i m?c t? cung).  Th? thu?t c?t dy th?n kinh ? ?y c?t s?ng (x??ng cng) ?i ??n c? quan sinh s?n (c?t th?n kinh tr??c x??ng cng).  Thu?t thu?t g?n dng ?i?n vo dy th?n kinh trn x??ng cng (kch thch dy th?n kinh x??ng cng).  T?p th? d?c v v?t l tr? li?u.  Thi?n v yoga tr? li?u.  Chm c?u. Lm vi?c v?i chuyn gia ch?m [place] s?c kh?e ?? xc ??nh bi?n php ?i?u tr? ho?c ph?i h?p cc bi?n php ?i?u tr? no l t?t nh?t cho qu v?. Tun th? nh?ng h??ng d?n ny ? nh: Gi?m ?au v gi?m co th?t  Ch??m nng vo th?t l?ng ho?c b?ng d??i c?a qu v? khi qu v? b? ?au ho?c co th?t. S? d?ng ngu?n nhi?t m chuyn gia ch?m  s?c kh?e khuy?n ngh?, ch?ng h?n nh? ti ch??m nhi?t ?m ho?c ??m ch??m nng. ? ?? kh?n t?m ? gi?a da v ngu?n nhi?t. ? Duy tr ngu?n nhi?t trong 20-30 pht. ? B? ngu?n nhi?t ra n?u da qu v? chuy?n sang mu ?? nh?t. ?i?u ny ??c bi?t quan tr?ng n?u qu v? khng th? c?m th?y ?au, nng, hay l?nh. Qu v? c th? c nguy c? b? b?ng cao h?n. ? Khng ng? khi ?ang dng mi?ng ??m ch??m nng.  T?p cc bi th? d?c nh?p ?i?u, nh? l ?i b?, b?i, ho?c ??p xe. T?p nh? v?y c th? gip lm gi?m co th?t.  Mt-xa vng th?t l?ng ho?c b?ng d??i ?? gip gi?m ?au. H??ng d?n  chung  Ch? s? d?ng thu?c khng k ??n v thu?c k ??n theo ch? d?n  c?a chuyn gia ch?m [place] s?c kh?e.  Khng li xe ho?c v?n hnh my mc h?ng n?ng trong khi dng thu?c gi?m ?au ???c k ??n.  Hessie Diener s? d?ng r??u v caffeine trong v ngay tr??c k? kinh c?a qu v?. Nh?ng ch?t ny c th? lm tnh tr?ng co th?t tr?m tr?ng h?n.  Khng s? d?ng b?t k? s?n ph?m no ch?a nicotine ho?c thu?c l, ch?ng ha?n nh? thu?c l d?ng ht v thu?c l ?i?n t?. N?u qu v? c?n gip ?? ?? cai thu?c, hy h?i chuyn gia ch?m [place] s?c kh?e.  Tun th? t?t c? cc l?n khm theo di theo ch? d?n c?a chuyn gia ch?m [place] s?c kh?e. ?i?u ny c vai tr quan tr?ng. Hy lin l?c v?i chuyn gia ch?m [place] s?c kh?e n?u:  N?u quy? vi? bi? ?au tr?m tr?ng h?n ho??c khng ??? sau khi du?ng thu?c.  Qu v? ?au khi quan h? tnh d?c.  Qu v? b? bu?n nn ho?c nn m?a khi c kinh nguy?t m khng ki?m sot ???c b?ng thu?c. Yu c?u tr? gip ngay l?p t?c n?u:  Qu v? b? ng?t. Tm t?t  ?au b?ng kinh ?? c?p ??n nh?ng c?n co th?t do c? t? cung th?t l?i (co rt) trong k? kinh gy ra.  ?au b?ng kinh c th? nh?, ho?c ?? n?ng ?? ?nh h??ng ??n sinh ho?t hng ngy trong vi ngy m?i thng.  ?i?u tr? ph? thu?c vo nguyn nhn gy ?au b?ng kinh.  Lm vi?c v?i chuyn gia ch?m [place] s?c kh?e ?? xc ??nh bi?n php ?i?u tr? ho?c ph?i h?p cc bi?n php ?i?u tr? no l t?t nh?t cho qu v?. Thng tin ny khng nh?m m?c ?ch thay th? cho l?i khuyn m chuyn gia ch?m [place] s?c kh?e ni v?i qu v?. Hy b?o ??m qu v? ph?i th?o lu?n b?t k? v?n ?? g m qu v? c v?i chuyn gia ch?m  s?c kh?e c?a qu v?. Document Released: 03/19/2015 Document Revised: 06/29/2016 Document Reviewed: 06/29/2016 Elsevier Interactive Patient Education  2019 [employer].

## 2018-04-11 NOTE — Progress Notes (Signed)
Established Patient Office Visit  Subjective:  Patient ID: Cristina Boyd, female    DOB: [birth year]  Age: 48 y.o. MRN: [id number]  CC: No chief complaint on file.  HPI Cristina Boyd is a 48 year old female who presents for abdominal pain today.   Past Medical History:  Diagnosis Date  . Fibroadenoma   . GERD (gastroesophageal reflux disease)    Current Status: Since her last office visit, she has c/o right flank, right mid-lower back area. She has been having this pain X for about 2 weeks now. She states that she has had more cramping and heavier blood flow with her periods lately. No reports of GI problems such as nausea, vomiting, diarrhea, and constipation. She has no reports of blood in stools, dysuria and hematuria. She denies fevers, chills, fatigue, recent infections, weight loss, and night sweats. She has not had any headaches, visual changes, dizziness, and falls. No chest pain, heart palpitations, cough and shortness of breath reported.  No depression or anxiety reported. She denies pain today.   No past surgical history on file.  Family History  Problem Relation Age of Onset  . Hypertension Mother   . Stroke Mother 45       mild CVA/TIA    Social History   Socioeconomic History  . Marital status: Married    Spouse name: Not on file  . Number of children: Not on file  . Years of education: Not on file  . Highest education level: Not on file  Occupational History  . Not on file  Social Needs  . Financial resource strain: Not on file  . Food insecurity:    Worry: Not on file    Inability: Not on file  . Transportation needs:    Medical: Not on file    Non-medical: Not on file  Tobacco Use  . Smoking status: Never Smoker  . Smokeless tobacco: Never Used  Substance and Sexual Activity  . Alcohol use: No  . Drug use: No  . Sexual activity: Yes    Birth control/protection: I.U.D.  Lifestyle  . Physical activity:    Days per week: Not on file    Minutes per  session: Not on file  . Stress: Not on file  Relationships  . Social connections:    Talks on phone: Not on file    Gets together: Not on file    Attends religious service: Not on file    Active member of club or organization: Not on file    Attends meetings of clubs or organizations: Not on file    Relationship status: Not on file  . Intimate partner violence:    Fear of current or ex partner: Not on file    Emotionally abused: Not on file    Physically abused: Not on file    Forced sexual activity: Not on file  Other Topics Concern  . 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 CONTOUR NEXT TEST test strip CHECK BLOOD SUGAR TWICE PER WEEK AS NEEDED  1  . omeprazole (PRILOSEC) 20 MG capsule Take 1 capsule (20 mg total) by mouth 2 (two) times daily before a meal. 60 capsule 1  . ranitidine (ZANTAC) 150 MG tablet TAKE 1 TABLET(150 MG) BY MOUTH TWICE DAILY 60 tablet 0   Facility-Administered Medications Prior to Visit  Medication Dose Route Frequency Provider Last Rate Last Dose  . 0.9 %  sodium chloride infusion  500 mL Intravenous Once Nandigam, Kavitha V, MD        No Known Allergies  ROS Review of Systems  Constitutional: Negative.   HENT: Negative.   Eyes: Negative.   Respiratory: Negative.   Cardiovascular: Negative.   Gastrointestinal: Negative.   Endocrine: Negative.   Genitourinary: Negative.   Musculoskeletal: Negative.        Right flank pain/cramps  Skin: Negative.   Allergic/Immunologic: Negative.   Neurological: Negative.   Hematological:  Negative.   Psychiatric/Behavioral: Negative.    Objective:    Physical Exam  Constitutional: She is oriented to person, place, and time. She appears well-developed and well-nourished.  HENT:  Head: Normocephalic and atraumatic.  Eyes: Conjunctivae are normal.  Neck: Normal range of motion. Neck supple.  Cardiovascular: Normal rate, regular rhythm, normal heart sounds and intact distal pulses.  Pulmonary/Chest: Effort normal and breath sounds normal.  Abdominal: Soft. Bowel sounds are normal.  Musculoskeletal: Normal range of motion.  Neurological: She is alert and oriented to person, place, and time. She has normal reflexes.  Skin: Skin is warm and dry.  Psychiatric: She has a normal mood and affect. Her behavior is normal. Judgment and thought content normal.  Nursing note and vitals reviewed.   There were no vitals taken for this visit. Wt Readings from Last 3 Encounters:  11/07/17 118 lb (53.5 kg)  10/31/17 118 lb (53.5 kg)  10/16/17 118 lb 9.6 oz (53.8 kg)     Health Maintenance Due  Topic Date Due  . INFLUENZA VACCINE  10/04/2017    There are no preventive care reminders to display for this patient.  Lab Results  Component Value Date   TSH 1.470 07/12/2017   Lab Results  Component Value Date   WBC 5.1 10/16/2017   HGB 12.8 10/16/2017   HCT 39.0 10/16/2017   MCV 97 10/16/2017   PLT 263 10/16/2017   Lab Results  Component Value Date   NA 140 10/16/2017   K 3.8 10/16/2017   CO2 24 10/16/2017   GLUCOSE 88 10/16/2017   BUN 10 10/16/2017   CREATININE 0.60 10/16/2017   BILITOT 0.4 10/16/2017   ALKPHOS 51 10/16/2017   AST 12 10/16/2017   ALT 17 10/16/2017   PROT 7.2 10/16/2017   ALBUMIN 4.4 10/16/2017   CALCIUM 9.3 10/16/2017   ANIONGAP 11 12/24/2016   Lab Results  Component Value Date   CHOL 180 08/01/2016   Lab Results  Component Value Date   HDL 62 08/01/2016   Lab Results  Component Value Date   LDLCALC 96 08/01/2016   Lab Results    Component Value Date   TRIG 111 08/01/2016   Lab Results  Component Value Date   CHOLHDL 2.9 08/01/2016   Lab Results  Component Value Date   HGBA1C 5.8 07/12/2017   Assessment & Plan:   1. Abdominal pain, epigastric - POCT urinalysis dipstick  2. Irregular menstrual cycle Pregnancy test is negative.  - POCT  urine pregnancy  3. Dysmenorrhea We will/ initiate Naproxen today.  - naproxen (NAPROSYN) 500 MG tablet; Take 1 tablet (500 mg total) by mouth 2 (two) times daily with a meal.  Dispense: 60 tablet; Refill: 1  4. Menorrhagia with regular cycle - naproxen (NAPROSYN) 500 MG tablet; Take 1 tablet (500 mg total) by mouth 2 (two) times daily with a meal.  Dispense: 60 tablet; Refill: 1  5. Gall/bladder polyp Continual upper right quadrant pain. [place] on 11/02/2018 revealed several gallbladder polyps. Patient does not want to have cholecystectomy. Recommended 6 months follow up US.  - US Abdomen Complete; Future  6. Follow up She will follow up as needed.   Meds ordered this encounter  Medications  . naproxen (NAPROSYN) 500 MG tablet    Sig: Take 1 tablet (500 mg total) by mouth 2 (two) times daily with a meal.    Dispense:  60 tablet    Refill:  [place],  MSN, FNP-C [facility] [address] [place], [place] [address] [phone]  Orders Placed This Encounter  Procedures  . US Abdomen Complete  . POCT urinalysis dipstick  . POCT urine pregnancy   Problem List Items Addressed This Visit    None      No orders of the defined types were placed in this encounter.   Follow-up: No follow-ups on file.    Azzie Glatter, FNP

## 2018-04-15 ENCOUNTER — Other Ambulatory Visit: Payer: BLUE CROSS/BLUE SHIELD

## 2018-04-16 ENCOUNTER — Inpatient Hospital Stay: Admission: RE | Admit: 2018-04-16 | Payer: BLUE CROSS/BLUE SHIELD | Source: Ambulatory Visit

## 2018-04-30 ENCOUNTER — Telehealth: Payer: Self-pay | Admitting: Family Medicine

## 2018-04-30 NOTE — Telephone Encounter (Unsigned)
Copied from [place] [id number]. Topic: Quick Communication - Other Results (Clinic Use ONLY) >> Apr 30, 2018  9:16 AM Scherrie Gerlach wrote: Pt went to [facility] on [address] for her ultra sound.  Pt has not heard anything and calling for results Pt is still having pain in abdomen and really needs to find out what is going on. [facility] [place], [place], [place] [address] [phone]

## 2018-08-21 IMAGING — DX DG NECK SOFT TISSUE
4 series · 4 of 4 positions shown · non-contrast
Comparison: None.

CLINICAL DATA: 46-year-old female with neck pain and swelling for 2
days.

EXAM:
NECK SOFT TISSUES - 1+ VIEW

[neck lat (1 of 2)]
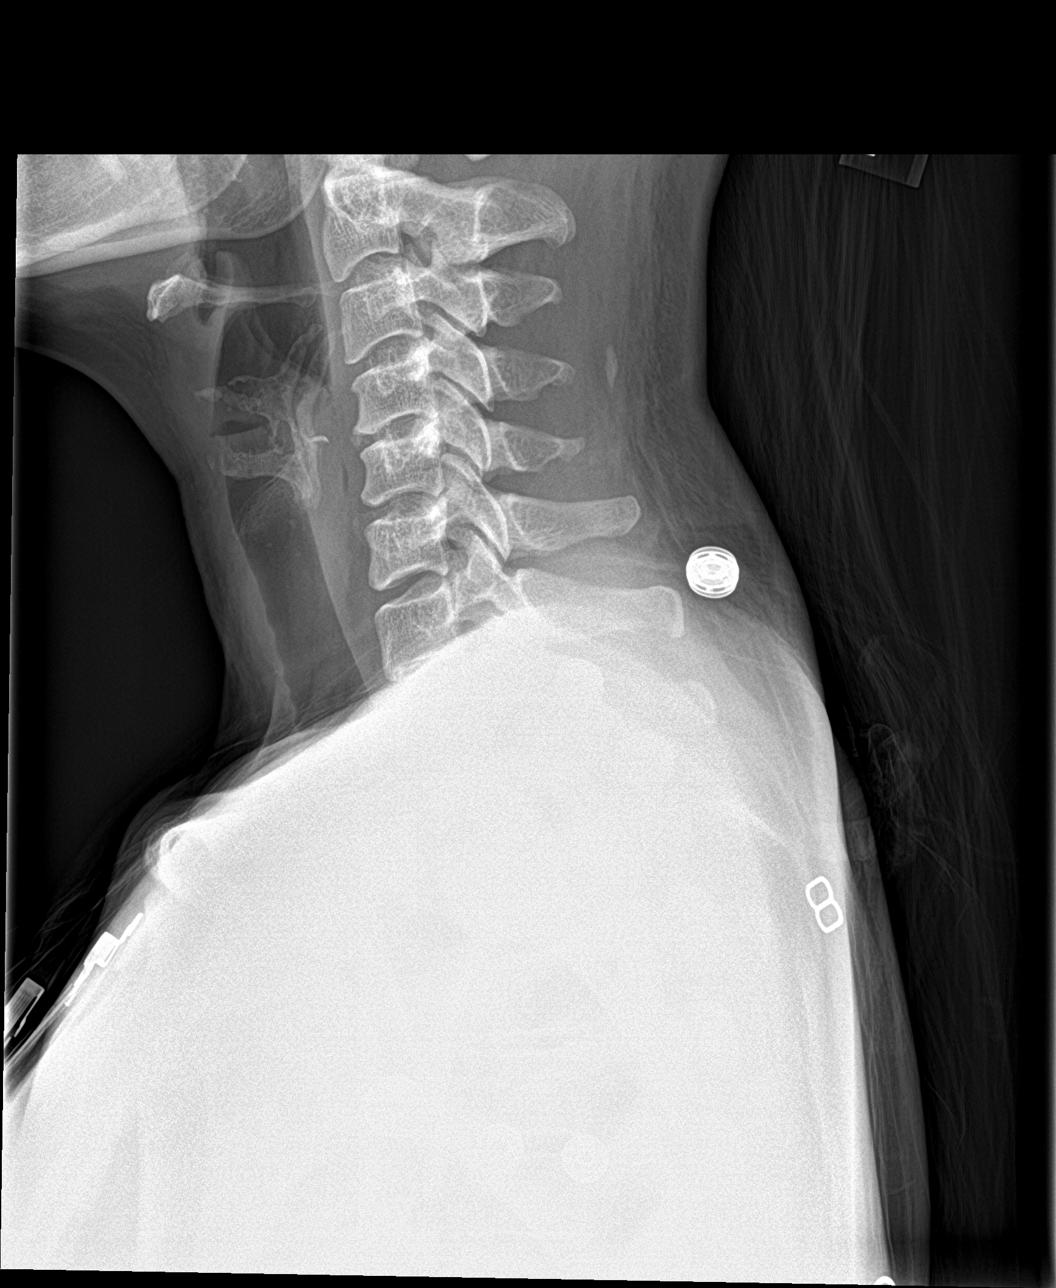

[neck ap (1 of 2)]
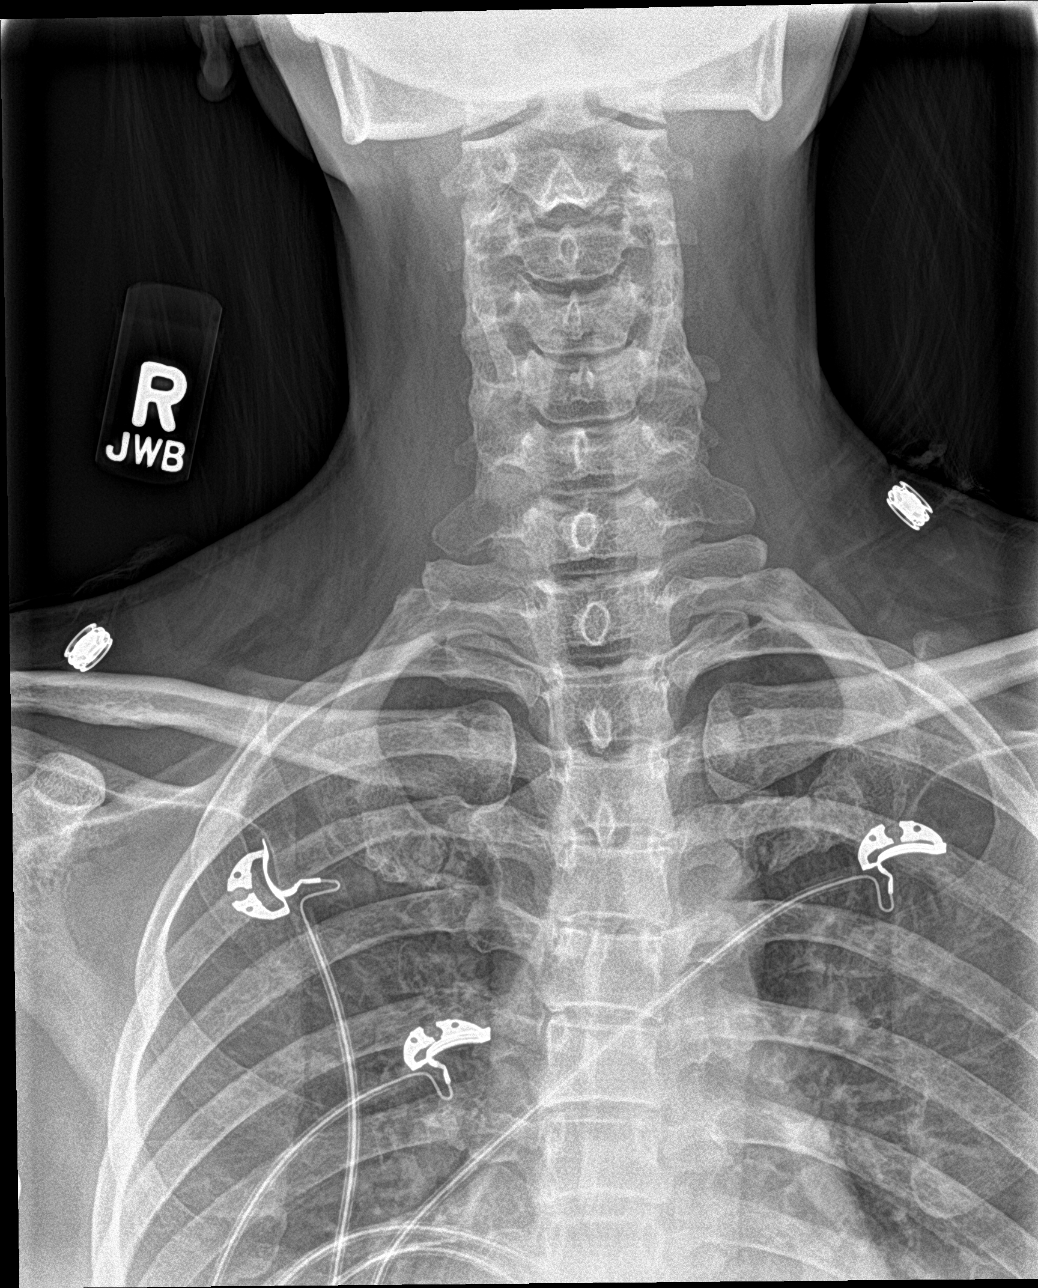

[neck ap (2 of 2)]
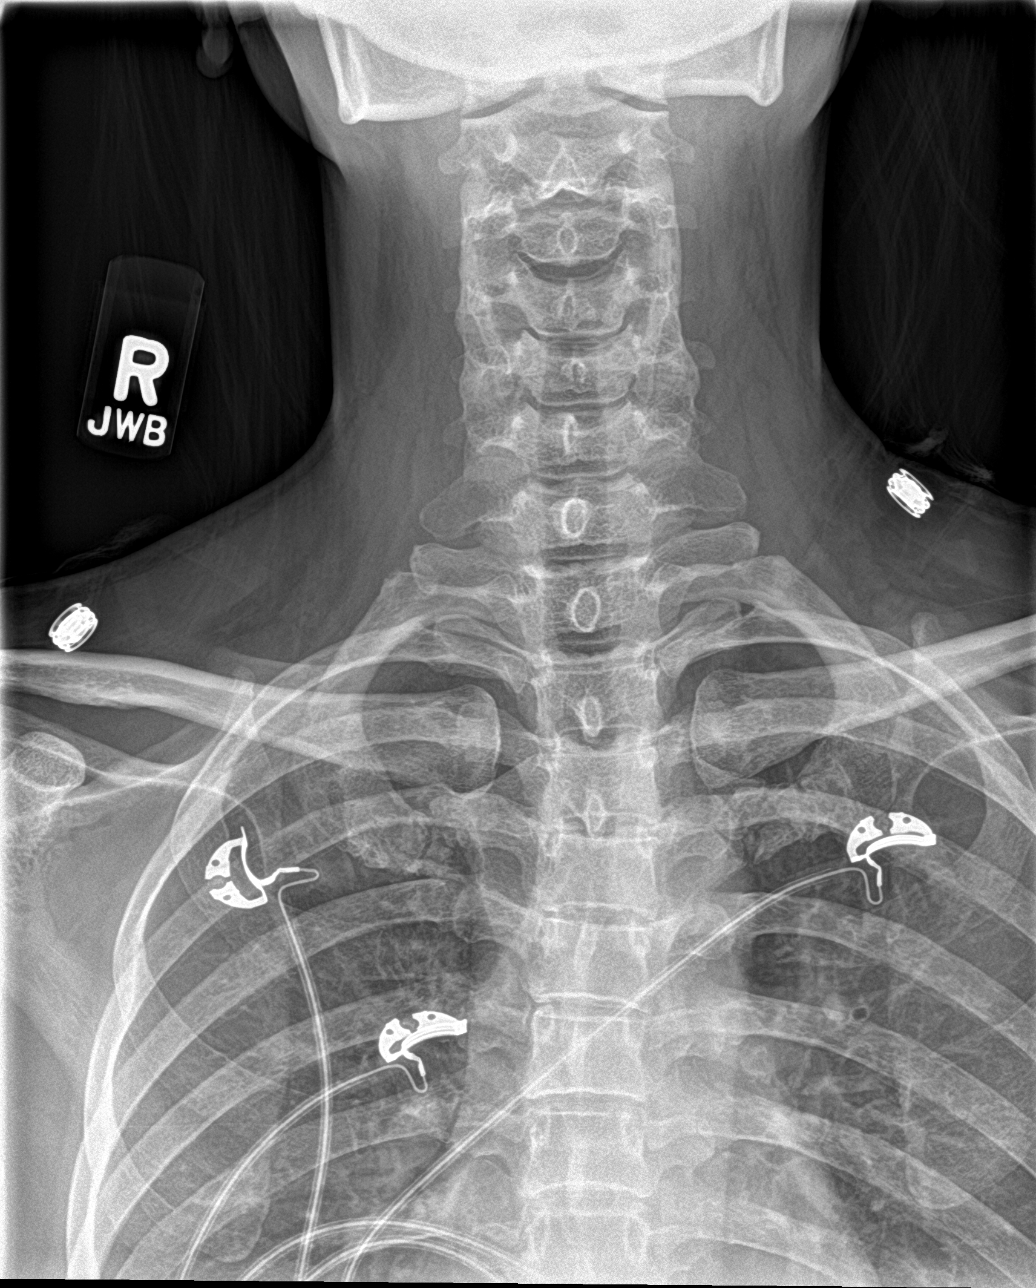

[neck lat (2 of 2)]
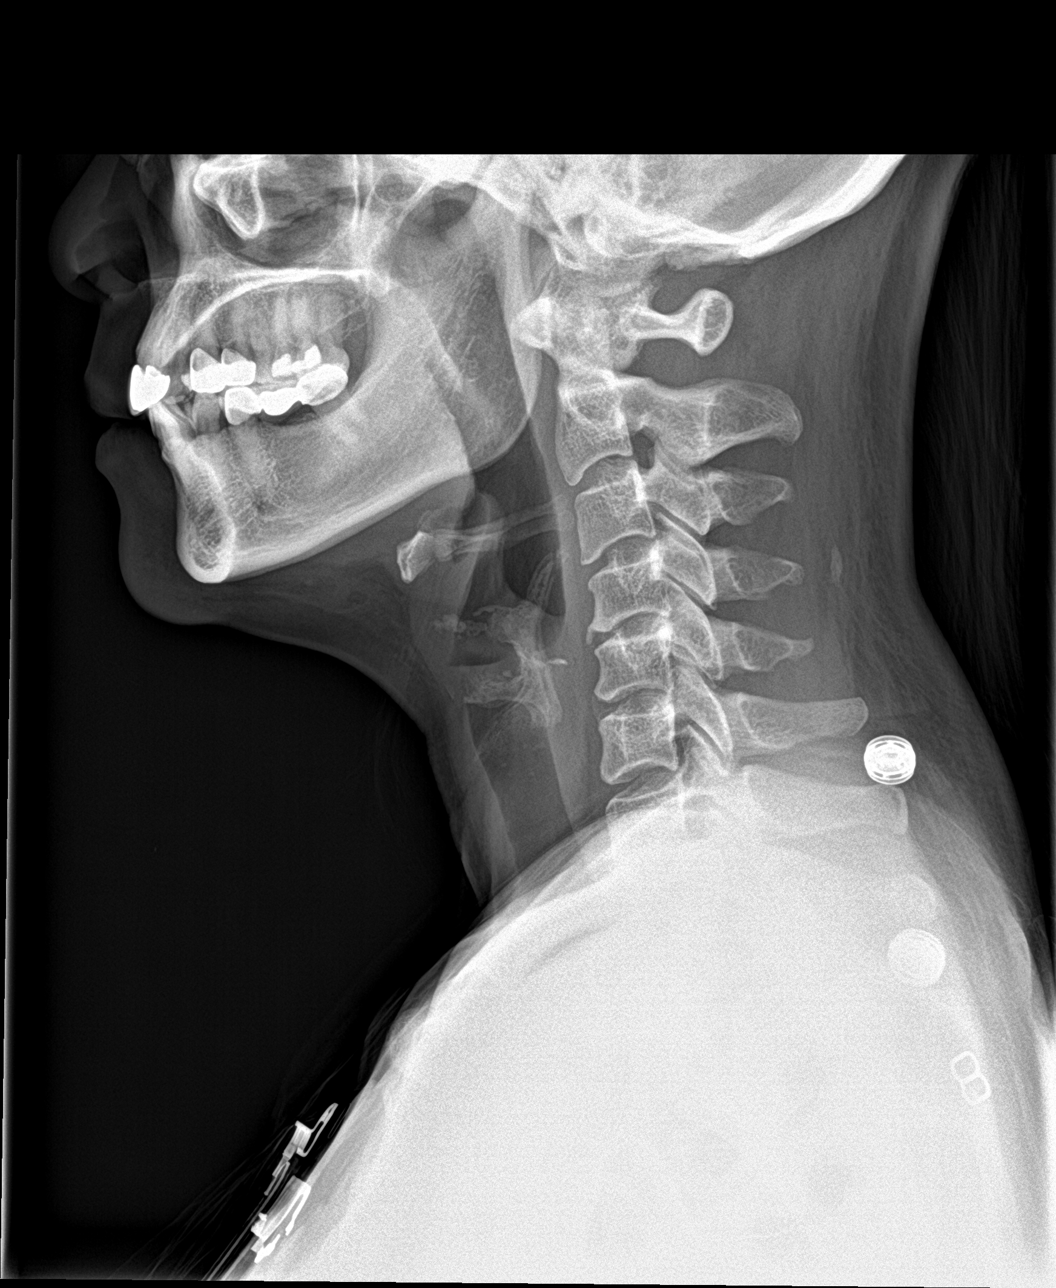

[4 of 4 positions shown; findings below may reference images not displayed]

FINDINGS: There is no evidence of retropharyngeal soft tissue swelling or
epiglottic enlargement. The cervical airway is unremarkable and no
radio-opaque foreign body identified.
IMPRESSION: Negative.

## 2019-03-17 ENCOUNTER — Ambulatory Visit: Payer: 59 | Admitting: Registered Nurse

## 2019-03-17 ENCOUNTER — Other Ambulatory Visit: Payer: Self-pay

## 2019-03-17 ENCOUNTER — Encounter: Payer: Self-pay | Admitting: Registered Nurse

## 2019-03-17 VITALS — BP 113/71 | HR 70 | Temp 98.1°F | Ht 64.0 in | Wt 129.2 lb

## 2019-03-17 DIAGNOSIS — Z7689 Persons encountering health services in other specified circumstances: Secondary | ICD-10-CM

## 2019-03-17 DIAGNOSIS — R102 Pelvic and perineal pain: Secondary | ICD-10-CM

## 2019-03-17 LAB — POCT URINALYSIS DIP (MANUAL ENTRY)
Bilirubin, UA: NEGATIVE
Glucose, UA: NEGATIVE mg/dL
Ketones, POC UA: NEGATIVE mg/dL
Nitrite, UA: NEGATIVE
Protein Ur, POC: NEGATIVE mg/dL
Spec Grav, UA: 1.01 (ref 1.010–1.025)
Urobilinogen, UA: 0.2 E.U./dL
pH, UA: 6 (ref 5.0–8.0)

## 2019-03-17 NOTE — Patient Instructions (Signed)
° ° ° °  If you have lab work done today you will be contacted with your lab results within the next 2 weeks.  If you have not heard from us then please contact us. The fastest way to get your results is to register for My Chart. ° ° °IF you received an x-ray today, you will receive an invoice from [facility]. Please contact [facility] at [phone] with questions or concerns regarding your invoice.  ° °IF you received labwork today, you will receive an invoice from [facility]. Please contact [facility] at [phone] with questions or concerns regarding your invoice.  ° °Our billing staff will not be able to assist you with questions regarding bills from these companies. ° °You will be contacted with the lab results as soon as they are available. The fastest way to get your results is to activate your My Chart account. Instructions are located on the last page of this paperwork. If you have not heard from us regarding the results in 2 weeks, please contact this office. °  ° ° ° °

## 2019-03-18 ENCOUNTER — Encounter: Payer: Self-pay | Admitting: Registered Nurse

## 2019-03-18 LAB — CBC WITH DIFFERENTIAL/PLATELET
Basophils Absolute: 0 10*3/uL (ref 0.0–0.2)
Basos: 0 %
EOS (ABSOLUTE): 0.1 10*3/uL (ref 0.0–0.4)
Eos: 1 %
Hematocrit: 38.7 % (ref 34.0–46.6)
Hemoglobin: 12.9 g/dL (ref 11.1–15.9)
Immature Grans (Abs): 0 10*3/uL (ref 0.0–0.1)
Immature Granulocytes: 0 %
Lymphocytes Absolute: 1.6 10*3/uL (ref 0.7–3.1)
Lymphs: 27 %
MCH: 31.5 pg (ref 26.6–33.0)
MCHC: 33.3 g/dL (ref 31.5–35.7)
MCV: 94 fL (ref 79–97)
Monocytes Absolute: 0.5 10*3/uL (ref 0.1–0.9)
Monocytes: 9 %
Neutrophils Absolute: 3.8 10*3/uL (ref 1.4–7.0)
Neutrophils: 63 %
Platelets: 290 10*3/uL (ref 150–450)
RBC: 4.1 x10E6/uL (ref 3.77–5.28)
RDW: 11.5 % — ABNORMAL LOW (ref 11.7–15.4)
WBC: 6.1 10*3/uL (ref 3.4–10.8)

## 2019-03-18 LAB — COMPREHENSIVE METABOLIC PANEL
ALT: 14 IU/L (ref 0–32)
AST: 17 IU/L (ref 0–40)
Albumin/Globulin Ratio: 1.6 (ref 1.2–2.2)
Albumin: 4.7 g/dL (ref 3.8–4.8)
Alkaline Phosphatase: 56 IU/L (ref 39–117)
BUN/Creatinine Ratio: 21 (ref 9–23)
BUN: 11 mg/dL (ref 6–24)
Bilirubin Total: 0.2 mg/dL (ref 0.0–1.2)
CO2: 23 mmol/L (ref 20–29)
Calcium: 9.5 mg/dL (ref 8.7–10.2)
Chloride: 104 mmol/L (ref 96–106)
Creatinine, Ser: 0.53 mg/dL — ABNORMAL LOW (ref 0.57–1.00)
GFR calc Af Amer: 131 mL/min/{1.73_m2} (ref >59)
GFR calc non Af Amer: 113 mL/min/{1.73_m2} (ref >59)
Globulin, Total: 2.9 g/dL (ref 1.5–4.5)
Glucose: 93 mg/dL (ref 65–99)
Potassium: 4 mmol/L (ref 3.5–5.2)
Sodium: 139 mmol/L (ref 134–144)
Total Protein: 7.6 g/dL (ref 6.0–8.5)

## 2019-03-18 LAB — FOLLICLE STIMULATING HORMONE: FSH: 3.7 m[IU]/mL

## 2019-03-20 ENCOUNTER — Encounter: Payer: Self-pay | Admitting: Registered Nurse

## 2019-03-20 NOTE — Progress Notes (Signed)
Acute Office Visit  Subjective:    Patient ID: Cristina Boyd, female    DOB: October 29, 1970, 49 y.o.   MRN: [id number]  Chief Complaint  Patient presents with  . Transitions Of Care  . Depression    PHQ9 = 7  . Abdominal Cramping    every month going on for about a year. Before and after the cycle and it is now its everyday for the last couple of months.     HPI Patient is in today for visit to establish care.  C/o lower abdominal and pelvic cramping. Had been occurring with menstrual cycle, but now is much more frequent. Still having mostly regular menses - missed cycle in November but had normal period in Dec.   Some concern for anxiety and depression. More concerned with cramping and identifying menopause if that's what's happening for her. No other symptoms of menopause beyond cramping at this time.  Past Medical History:  Diagnosis Date  . Fibroadenoma   . GERD (gastroesophageal reflux disease)     No past surgical history on file.  Family History  Problem Relation Age of Onset  . Hypertension Mother   . Stroke Mother 33       mild CVA/TIA    Social History   Socioeconomic History  . Marital status: Married    Spouse name: Not on file  . Number of children: Not on file  . Years of education: Not on file  . Highest education level: Not on file  Occupational History  . Not on file  Tobacco Use  . Smoking status: Never Smoker  . Smokeless tobacco: Never Used  Substance and Sexual Activity  . Alcohol use: No  . Drug use: No  . Sexual activity: Yes    Birth control/protection: I.U.D.  Other Topics Concern  . 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 Difficulty of Paying Living Expenses: Not on file  Food Insecurity:   . Worried About Charity fundraiser in the Last Year: Not on file  . Ran Out of Food in the Last Year: Not on file  Transportation Needs:   . Lack of Transportation (Medical): Not on file  . Lack of Transportation (Non-Medical): Not on file  Physical Activity:   . Days of Exercise per Week: Not on file  . Minutes of Exercise per Session: Not on file  Stress:   . Feeling of Stress : Not on file  Social Connections:   . Frequency of Communication with Friends and Family: Not on file  . Frequency of Social Gatherings with Friends and Family: Not on file  . Attends Religious Services: Not on file  . Active Member of Clubs or Organizations: Not on file  . Attends Archivist Meetings: Not on file  . Marital Status: Not on file  Intimate Partner Violence:   . Fear of Current or Ex-Partner: Not on file  .  Emotionally Abused: Not on file  . Physically Abused: Not on file  . Sexually Abused: Not on file    Outpatient Medications Prior to Visit  Medication Sig Dispense Refill  . CONTOUR NEXT TEST test strip CHECK BLOOD SUGAR TWICE PER WEEK AS NEEDED  1  . omeprazole (PRILOSEC) 20 MG capsule Take 1 capsule (20 mg total) by mouth 2 (two) times daily before a meal. 60 capsule 1  . ranitidine (ZANTAC) 150 MG tablet TAKE 1 TABLET(150 MG) BY MOUTH TWICE DAILY (Patient not taking: Reported on 03/17/2019) 60 tablet 0  . naproxen (NAPROSYN) 500 MG tablet Take 1 tablet (500 mg total) by mouth 2 (two) times daily with a meal. 60 tablet 1   Facility-Administered Medications Prior to Visit  Medication Dose Route Frequency Provider Last Rate Last Admin  . 0.9 %  sodium chloride infusion  500 mL Intravenous Once Nandigam, Venia Minks, MD        No Known Allergies  Review of Systems  Constitutional: Negative.   HENT: Negative.   Eyes:  Negative.   Respiratory: Negative.   Cardiovascular: Negative.   Gastrointestinal: Negative.   Endocrine: Negative.   Genitourinary: Positive for menstrual problem and pelvic pain. Negative for decreased urine volume, difficulty urinating, dyspareunia, dysuria, enuresis, flank pain, frequency, genital sores, hematuria, urgency, vaginal bleeding, vaginal discharge and vaginal pain.  Musculoskeletal: Negative.   Skin: Negative.   Allergic/Immunologic: Negative.   Neurological: Negative.   Hematological: Negative.   Psychiatric/Behavioral: Negative.   All other systems reviewed and are negative.      Objective:    Physical Exam Vitals and nursing note reviewed.  Constitutional:      General: She is not in acute distress.    Appearance: Normal appearance. She is normal weight. She is not ill-appearing, toxic-appearing or diaphoretic.  Cardiovascular:     Rate and Rhythm: Normal rate and regular rhythm.  Pulmonary:     Effort: Pulmonary effort is normal. No respiratory distress.  Skin:    Capillary Refill: Capillary refill takes less than 2 seconds.  Neurological:     General: No focal deficit present.     Mental Status: She is alert and oriented to person, place, and time. Mental status is at baseline.  Psychiatric:        Mood and Affect: Mood normal.        Behavior: Behavior normal.        Thought Content: Thought content normal.        Judgment: Judgment normal.     BP 113/71   Pulse 70   Temp 98.1 F (36.7 C) (Temporal)   Ht 5\' 4"  (1.626 m)   Wt 129 lb 3.2 oz (58.6 kg)   LMP 03/05/2019   SpO2 98%   BMI 22.18 kg/m  Wt Readings from Last 3 Encounters:  03/17/19 129 lb 3.2 oz (58.6 kg)  04/11/18 123 lb 12.8 oz (56.2 kg)  11/07/17 118 lb (53.5 kg)    Health Maintenance Due  Topic Date Due  . PAP SMEAR-Modifier  07/04/2018  . INFLUENZA VACCINE  10/05/2018    There are no preventive care reminders to display for this patient.   Lab Results  Component Value  Date   TSH 1.470 07/12/2017   Lab Results  Component Value Date   WBC 6.1 03/17/2019   HGB 12.9 03/17/2019   HCT 38.7 03/17/2019   MCV 94 03/17/2019   PLT 290 03/17/2019   Lab Results  Component Value Date  NA 139 03/17/2019   K 4.0 03/17/2019   CO2 23 03/17/2019   GLUCOSE 93 03/17/2019   BUN 11 03/17/2019   CREATININE 0.53 (L) 03/17/2019   BILITOT 0.2 03/17/2019   ALKPHOS 56 03/17/2019   AST 17 03/17/2019   ALT 14 03/17/2019   PROT 7.6 03/17/2019   ALBUMIN 4.7 03/17/2019   CALCIUM 9.5 03/17/2019   ANIONGAP 11 12/24/2016   Lab Results  Component Value Date   CHOL 180 08/01/2016   Lab Results  Component Value Date   HDL 62 08/01/2016   Lab Results  Component Value Date   LDLCALC 96 08/01/2016   Lab Results  Component Value Date   TRIG 111 08/01/2016   Lab Results  Component Value Date   CHOLHDL 2.9 08/01/2016   Lab Results  Component Value Date   HGBA1C 5.8 07/12/2017       Assessment & Plan:   Problem List Items Addressed This Visit    None    Visit Diagnoses    Pelvic cramping    -  Primary   Relevant Orders   Ambulatory referral to Gynecology   POCT urinalysis dipstick (Completed)   Follicle Stimulating Hormone (Completed)   Comprehensive metabolic panel (Completed)   CBC with Differential (Completed)   US PELVIC COMPLETE WITH TRANSVAGINAL       No orders of the defined types were placed in this encounter.  PLAN  Discussed with patient that she does not meet standards of amenorrhea to make a dx of menopause, but we can draw labs today, including [place], to determine if any concerns are there.  Will refer to GYN for pap per patient request, will also suggest further workup with specialist  Pelvic US w transvaginal ordered. Will follow up on results.  Patient encouraged to call clinic with any questions, comments, or concerns.   Maximiano Coss, NP

## 2019-03-27 ENCOUNTER — Ambulatory Visit
Admission: RE | Admit: 2019-03-27 | Discharge: 2019-03-27 | Disposition: A | Discharge status: Home / Self Care | Payer: 59 | Source: Ambulatory Visit | Attending: Registered Nurse | Admitting: Registered Nurse

## 2019-03-27 DIAGNOSIS — R102 Pelvic and perineal pain: Secondary | ICD-10-CM

## 2019-03-28 ENCOUNTER — Encounter: Payer: Self-pay | Admitting: Registered Nurse

## 2019-03-28 NOTE — Progress Notes (Signed)
[place] results back 2 fibroids - likely cause for cramping pain Cervical polyp - pt will follow up with Gyn, referral already placed Letter sent to patient via [place].  Kathrin Ruddy, NP

## 2019-04-07 ENCOUNTER — Other Ambulatory Visit: Payer: Self-pay

## 2019-04-08 ENCOUNTER — Ambulatory Visit: Payer: 59 | Admitting: Obstetrics and Gynecology

## 2019-04-08 ENCOUNTER — Encounter: Payer: Self-pay | Admitting: Obstetrics and Gynecology

## 2019-04-08 VITALS — BP 116/76 | Ht 63.0 in | Wt 125.0 lb

## 2019-04-08 DIAGNOSIS — R102 Pelvic and perineal pain: Secondary | ICD-10-CM

## 2019-04-08 DIAGNOSIS — N946 Dysmenorrhea, unspecified: Secondary | ICD-10-CM

## 2019-04-08 DIAGNOSIS — D251 Intramural leiomyoma of uterus: Secondary | ICD-10-CM

## 2019-04-08 DIAGNOSIS — D250 Submucous leiomyoma of uterus: Secondary | ICD-10-CM

## 2019-04-08 DIAGNOSIS — Z01419 Encounter for gynecological examination (general) (routine) without abnormal findings: Secondary | ICD-10-CM

## 2019-04-08 DIAGNOSIS — N889 Noninflammatory disorder of cervix uteri, unspecified: Secondary | ICD-10-CM

## 2019-04-08 MED ORDER — NORGESTIMATE-ETH ESTRADIOL 0.25-35 MG-MCG PO TABS: 1.0000 | ORAL_TABLET | Freq: Every day | ORAL | 4 refills | Status: DC

## 2019-04-08 NOTE — Patient Instructions (Addendum)
Please schedule a sonohystogram to evaluate a possible endocervical polyp We discussed the finding of uterine fibroids that may be causing you increased pelvic cramping before and after your period We will have you try a birth control pill for 3 months to see if this helps reduce your cramping, I have sent you a prescription Uterine Fibroids  Uterine fibroids are lumps of tissue (tumors) in your womb (uterus). They are not cancer (are benign). Most women with this condition do not need treatment. Sometimes fibroids can affect your ability to have children (your fertility). If that happens, you may need surgery to take out the fibroids. Follow these instructions at home:  Take over-the-counter and prescription medicines only as told by your doctor. Your doctor may suggest NSAIDs (such as aspirin or ibuprofen) to help with pain.  Ask your doctor if you should: ? Take iron pills. ? Eat more foods that have iron in them, such as dark green, leafy vegetables.  If directed, apply heat to your back or belly to reduce pain. Use the heat source that your doctor recommends, such as a moist heat pack or a heating pad. ? Put a towel between your skin and the heat source. ? Leave the heat on for 20-30 minutes. ? Remove the heat if your skin turns bright red. This is especially important if you are unable to feel pain, heat, or cold. You may have a greater risk of getting burned.  Pay close attention to your period (menstrual) cycles. Tell your doctor about any changes, such as: ? A heavier blood flow than usual. ? Needing to use more pads or tampons than normal. ? A change in how many days your period lasts. ? A change in symptoms that come with your period, such as cramps or back pain.  Keep all follow-up visits as told by your doctor. This is important. Your doctor may need to watch your fibroids over time for any changes. Contact a doctor if you:  Have pain that does not get better with medicine  or heat, such as pain or cramps in: ? Your back. ? The area between your hip bones (pelvic area). ? Your belly.  Have new bleeding between your periods.  Have more bleeding during or between your periods.  Feel very tired or weak.  Feel light-headed. Get help right away if you:  Pass out (faint).  Have pain in the area between your hip bones that suddenly gets worse.  Have bleeding that soaks a tampon or pad in 30 minutes or less. Summary  Uterine fibroids are lumps of tissue (tumors) in your womb (uterus). They are not cancer.  The only treatment that most women need is taking aspirin or ibuprofen for pain.  Contact a doctor if you have pain or cramps that do not get better with medicine.  Make sure you know what symptoms you should get help for right away. This information is not intended to replace advice given to you by your health care provider. Make sure you discuss any questions you have with your health care provider. Document Revised: 02/02/2017 Document Reviewed: 01/16/2017 Elsevier Patient Education  2020 [employer].

## 2019-04-08 NOTE — Progress Notes (Signed)
Cristina Boyd  16-May-1970 [id number]  HPI The patient is a 49 y.o. G4P4 who presents today for evaluation pelvic pain in addition to a routine gynecologic exam. She presented to a primary care provider and had a pelvic ultrasound performed 03/27/2019 with findings including a normal-sized uterus with several small fibroids noted, the largest of which was 2 cm in the anterior subserosal area, her bilateral ovaries appeared normal, no free pelvic fluid, and there was structure located in the cervix measuring 1.9 cm that is likely an endocervical polyp.  Gets pelvic cramping about a week before her periods and up to 10 days after.  Periods not heavy, flow lasts 3-4 days.  She had two periods in October or so, but ever since has been regular now and just once per month.  Currently not using birth control.  Used the IUD in the past for about 10 years and tolerated fairly well (two separate IUDs).   She also is in need of a routine gynecologic exam.  Past medical history,surgical history, problem list, medications, allergies, family history and social history were all reviewed and documented as reviewed in the EPIC chart.  ROS:  Feeling well. No dyspnea or chest pain on exertion.  No abdominal pain, change in bowel habits, black or bloody stools.  No urinary tract symptoms. GYN ROS: normal menses, no abnormal bleeding, + pelvic pain, no discharge, no breast pain or new or enlarging lumps on self exam. No neurological complaints.  Physical Exam  BP 116/76   Ht 5\' 3"  (1.6 m)   Wt 125 lb (56.7 kg)   LMP 03/28/2019   BMI 22.14 kg/m   General: Pleasant  female, no acute distress, alert and oriented  BREAST EXAM: breasts appear normal, no suspicious masses, no skin or nipple changes or axillary nodes  PELVIC EXAM: VULVA: normal appearing vulva with no masses, tenderness or lesions, VAGINA: normal appearing vagina with normal color and discharge, no lesions, CERVIX: normal appearing cervix without discharge  or lesions, UTERUS: uterus is normal size, shape, consistency and nontender, ADNEXA: normal adnexa in size, nontender and no masses  Caryn Bee present during the exam  Assessment 1.  Routine gynecologic exam 2.  Chronic and cyclic pelvic pain/dysmenorrhea 3.  Fibroid uterus 4.  Endocervical polyp  Plan 1. Pap smear is up to date with normal cytology/negative HPV 06/2015.  Repeat Pap smear next year in 2022.  I recommended that she get a mammogram scheduled for routine annual screening, with her last one appearing to be in December 2019.  I will have staff reach out to her to help facilitate this.  2.  We discussed the differential diagnosis for cyclic pelvic pain.  She does have a fibroid uterus so this could be contributing to dysmenorrhea, or there could be other causes.  We discussed potential of endometriosis.  I recommended trial of contraception to lessen the impact of her pelvic pain and the use of scheduled ibuprofen therapy starting before she is experiencing the pain and for up to 5 days, a common regimen is ibuprofen 400 to 600 mg every 6 hours taken with food for up to 5 days.  She is not interested in having another IUD at this point so we decided on an oral contraceptive pills.  I gave her a prescription for Sprintec/Ortho-Cyclen with 1 year of refills.  We discussed the common potential side effects of birth control pills.  She has no medical contraindication.  I would like her to try this  for about 3 to 4 months to see if it is helping her before we discussed other potential management options, and/or evaluation.  3. The fibroids identified on her pelvic ultrasound are small size.  We spent time today discussing fibroids and how they are quite common and a vast majority of the time are benign and findings then even asymptomatic.  No specific follow-up is necessary for an asymptomatic or mildly symptomatic fibroid.  I did discuss some of the symptoms that would potentially indicate  rapid growth of the fibroid such as increased pelvic pressure, discomfort, changes in bowel habits, or increased vaginal bleeding and/or heavier periods.  Should she experience these symptoms she should come back for evaluation. Even with fibroids that do grow rapidly, malignancy is very uncommon.  I discussed that as patients enter menopause fibroids are expected to stabilize in size and usually shrink.  4.  Endocervical polyp.  There is suspicion for this based upon the appearance on the recent pelvic ultrasound.  She is not having any appreciable abnormal uterine bleeding or intermenstrual spotting.  I have recommended a sonohysterogram to better evaluate this potential lesion identified on the ultrasound and she will proceed to schedule this.  All questions were answered by the end of today's visit.   Larey Days MD 04/08/19

## 2019-04-10 ENCOUNTER — Telehealth: Payer: Self-pay | Admitting: *Deleted

## 2019-04-10 NOTE — Telephone Encounter (Signed)
I called patient and she did understand english and informed her she needs to schedule mammogram appointment. It appears she has been to [facility] in past. I gave her the number to schedule. Patient said she prefers to call and schedule due to work schedule. No order needed as this is a routine mammogram.

## 2019-04-10 NOTE — Telephone Encounter (Signed)
-----   Message from Joseph Pierini, MD sent at 04/08/2019  5:38 PM EST ----- Regarding: mammogram This new patient needs to have a mammogram screen scheduled, thank you.

## 2020-01-07 ENCOUNTER — Ambulatory Visit: Payer: 59 | Admitting: Registered Nurse

## 2020-01-07 ENCOUNTER — Ambulatory Visit (INDEPENDENT_AMBULATORY_CARE_PROVIDER_SITE_OTHER): Payer: 59

## 2020-01-07 ENCOUNTER — Other Ambulatory Visit: Payer: Self-pay

## 2020-01-07 ENCOUNTER — Encounter: Payer: Self-pay | Admitting: Registered Nurse

## 2020-01-07 VITALS — BP 124/83 | HR 89 | Temp 98.2°F | Resp 18 | Ht 63.0 in | Wt 129.7 lb

## 2020-01-07 DIAGNOSIS — M62838 Other muscle spasm: Secondary | ICD-10-CM | POA: Diagnosis not present

## 2020-01-07 DIAGNOSIS — M47812 Spondylosis without myelopathy or radiculopathy, cervical region: Secondary | ICD-10-CM

## 2020-01-07 DIAGNOSIS — M792 Neuralgia and neuritis, unspecified: Secondary | ICD-10-CM | POA: Diagnosis not present

## 2020-01-07 MED ORDER — METHYLPREDNISOLONE ACETATE 80 MG/ML IJ SUSP
80.0000 mg | Freq: Once | INTRAMUSCULAR | Status: AC
  Administered 2020-01-07: 80 mg via INTRAMUSCULAR

## 2020-01-07 MED ORDER — MELOXICAM 7.5 MG PO TABS: 7.5000 mg | ORAL_TABLET | Freq: Every day | ORAL | 0 refills | Status: DC

## 2020-01-07 MED ORDER — METHOCARBAMOL 500 MG PO TABS: 500.0000 mg | ORAL_TABLET | Freq: Four times a day (QID) | ORAL | 0 refills | Status: DC

## 2020-01-07 NOTE — Patient Instructions (Addendum)
Cristina Boyd -  Doristine Devoid to see you today.   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 Muscle spasm in the cervical paraspinal muscles - this is what is causing your spine to be so straight, and likely is why the pain has been worse lately.  I want to do three things to address this for you:  Meloxicam: take 1 tablet (7.5mg ) by mouth once daily. Do not take any other NSAIDs (ibuprofen, aleve, naproxen, etc) with this medication.  Methocarbamol: take up to 3 tablets daily (500mg  each). These may make you very mildly sleepy, but overall should be easy to tolerate. This will help relax your muscles.  Physical therapy: it can be very useful to stretch and strengthen the muscles in your neck and shoulders. This will help restore normal feeling in your arm and hand. Physical therapy will call you to schedule an appt.   C Nguy?n -  R?t vui ???c g?p b?n hm nay.  Ti ngh? b?n c hai v?n ?? gp ph?n vo n?i ?au c?a b?n: Nh?ng thay ??i thoi ha ? c?t s?ng - nh?ng thay ??i ny t??ng ??i nh? v ch? gi?i h?n ? hai ??a ??m, C4 v C5. Co th?t c? ? cc c? c?nh c?t s?ng c? - ?y l nguyn nhn khi?n c?t s?ng c?a b?n r?t th?ng v c th? l l do t?i sao c?n ?au g?n ?y tr? nn t?i t? h?n.  Ti mu?n lm ba ?i?u ?? gi?i quy?t v?n ?? ny cho b?n:  Meloxicam: u?ng 1 vin (7,5mg ), u?ng m?t l?n m?i ngy. Khng dng b?t k? NSAID no khc (ibuprofen, aleve, naproxen, v.v.) v?i thu?c ny.  Methocarbamol: u?ng t?i ?a 3 vin m?i ngy (m?i vin 500mg ). Nh?ng th? ny c th? khi?n b?n bu?n ng? r?t nh?, nh?ng nhn chung ph?i d? dung n?p. ?i?u ny s? gip th? gin c? b?p c?a b?n.  V?t l tr? li?u: c th? r?t h?u ch ?? ko c?ng v t?ng c??ng cc c? ? c? v vai c?a b?n. ?i?u ny s? gip khi ph?c c?m gic bnh th??ng ? cnh tay v bn tay c?a b?n. V?t l tr? li?u s? g?i cho b?n ?? s?p x?p m?t ?ng  d?ng.   If you have lab work done today you will be contacted with your lab results within the next 2 weeks.  If you have not heard from [place] then please contact us. The fastest way to get your results is to register for My Chart.   IF you received an x-ray today, you will receive an invoice from [facility]. Please contact [facility] at [phone] with questions or concerns regarding your invoice.   IF you received labwork today, you will receive an invoice from [facility]. Please contact [facility] at [phone] with questions or concerns regarding your invoice.   Our billing staff will not be able to assist you with questions regarding bills from these companies.  You will be contacted with the lab results as soon as they are available. The fastest way to get your results is to activate your My Chart account. Instructions are located on the last page of this paperwork. If you have not heard from [place] regarding the results in 2 weeks, please contact this office.

## 2020-01-08 ENCOUNTER — Encounter: Payer: Self-pay | Admitting: Registered Nurse

## 2020-01-08 NOTE — Progress Notes (Signed)
Acute Office Visit  Subjective:    Patient ID: Cristina Boyd, female    DOB: [birth year], 49 y.o.   MRN: [id number]  Chief Complaint  Patient presents with  . Neck Pain    Patient states she has been experiencing soe neck pain for about 3 years and now she states in the last four months the pain has got worse.    HPI Patient is in today for neck pain Chronic, ongoing for years,  But suddenly worsening over the past 3-4 mos Radiates down L arm. Numbness and weakness in hand and fingers.  No headaches or visual changes Some pain at base of skull Works as Engineer, civil (consulting) No recent imaging of neck.  Past Medical History:  Diagnosis Date  . Fibroadenoma   . GERD (gastroesophageal reflux disease)     No past surgical history on file.  Family History  Problem Relation Age of Onset  . Hypertension Mother   . Stroke Mother 61       mild CVA/TIA    Social History   Socioeconomic History  . Marital status: Married    Spouse name: Not on file  . Number of children: Not on file  . Years of education: Not on file  . Highest education level: Not on file  Occupational History  . Not on file  Tobacco Use  . Smoking status: Never Smoker  . Smokeless tobacco: Never Used  Vaping Use  . Vaping Use: Never used  Substance and Sexual Activity  . Alcohol use: No  . Drug use: No  . Sexual activity: Yes    Birth control/protection: Condom    Comment: 1st intercourse 64 yo,1 partner  Other Topics Concern  . 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 Difficulty of Paying Living Expenses:  Not on file  Food Insecurity:   . Worried About Charity fundraiser in the Last Year: Not on file  . Ran Out of Food in the Last Year: Not on file  Transportation Needs:   . Lack of Transportation (Medical): Not on file  . Lack of Transportation (Non-Medical): Not on file  Physical Activity:   . Days of Exercise per Week: Not on file  . Minutes of Exercise per Session: Not on file  Stress:   . Feeling of Stress : Not on file  Social Connections:   . Frequency of Communication with Friends and Family: Not on file  . Frequency of Social Gatherings with Friends and Family: Not on file  . Attends Religious Services: Not on file  . Active Member of Clubs or Organizations: Not on file  . Attends Archivist Meetings: Not on file  . Marital Status: Not on file  Intimate Partner Violence:   . Fear of Current or Ex-Partner: Not on file  . Emotionally Abused: Not on file  . Physically Abused: Not on file  . Sexually Abused: Not on  file    Outpatient Medications Prior to Visit  Medication Sig Dispense Refill  . norgestimate-ethinyl estradiol (ORTHO-CYCLEN) 0.25-35 MG-MCG tablet Take 1 tablet by mouth daily. 3 Package 4  . omeprazole (PRILOSEC) 20 MG capsule Take 1 capsule (20 mg total) by mouth 2 (two) times daily before a meal. 60 capsule 1  . VITAMIN D PO Take by mouth.     Facility-Administered Medications Prior to Visit  Medication Dose Route Frequency Provider Last Rate Last Admin  . 0.9 %  sodium chloride infusion  500 mL Intravenous Once Nandigam, Venia Minks, MD        No Known Allergies  Review of Systems  Constitutional: Negative.   HENT: Negative.   Eyes: Negative.   Respiratory: Negative.   Cardiovascular: Negative.   Gastrointestinal: Negative.   Genitourinary: Negative.   Musculoskeletal: Negative.   Skin: Negative.   Neurological: Negative.   Psychiatric/Behavioral: Negative.        Objective:    Physical Exam Vitals and nursing note reviewed.    Constitutional:      Appearance: Normal appearance.  Neck:     Vascular: No carotid bruit.  Cardiovascular:     Rate and Rhythm: Normal rate and regular rhythm.     Pulses: Normal pulses.  Pulmonary:     Effort: Pulmonary effort is normal. No respiratory distress.     Breath sounds: Normal breath sounds.  Musculoskeletal:        General: Tenderness present. No swelling, deformity or signs of injury. Normal range of motion.     Cervical back: Normal range of motion. Rigidity and tenderness present.     Right lower leg: No edema.     Left lower leg: No edema.  Lymphadenopathy:     Cervical: No cervical adenopathy.  Skin:    General: Skin is warm and dry.     Capillary Refill: Capillary refill takes less than 2 seconds.     Coloration: Skin is not jaundiced or pale.     Findings: No bruising, erythema, lesion or rash.  Neurological:     General: No focal deficit present.     Mental Status: She is alert and oriented to person, place, and time. Mental status is at baseline.  Psychiatric:        Mood and Affect: Mood normal.        Behavior: Behavior normal.        Thought Content: Thought content normal.        Judgment: Judgment normal.     BP 124/83   Pulse 89   Temp 98.2 F (36.8 C) (Temporal)   Resp 18   Ht 5\' 3"  (1.6 m)   Wt 129 lb 10.4 oz (58.8 kg)   SpO2 97%   BMI 22.97 kg/m  Wt Readings from Last 3 Encounters:  01/07/20 129 lb 10.4 oz (58.8 kg)  04/08/19 125 lb (56.7 kg)  03/17/19 129 lb 3.2 oz (58.6 kg)    Health Maintenance Due  Topic Date Due  . PAP SMEAR-Modifier  07/04/2018  . COVID-19 Vaccine (2 - Pfizer 2-dose series) 08/21/2019    There are no preventive care reminders to display for this patient.   Lab Results  Component Value Date   TSH 1.470 07/12/2017   Lab Results  Component Value Date   WBC 6.1 03/17/2019   HGB 12.9 03/17/2019   HCT 38.7 03/17/2019   MCV 94 03/17/2019   PLT 290 03/17/2019   Lab Results  Component Value Date  NA 139 03/17/2019   K 4.0 03/17/2019   CO2 23 03/17/2019   GLUCOSE 93 03/17/2019   BUN 11 03/17/2019   CREATININE 0.53 (L) 03/17/2019   BILITOT 0.2 03/17/2019   ALKPHOS 56 03/17/2019   AST 17 03/17/2019   ALT 14 03/17/2019   PROT 7.6 03/17/2019   ALBUMIN 4.7 03/17/2019   CALCIUM 9.5 03/17/2019   ANIONGAP 11 12/24/2016   Lab Results  Component Value Date   CHOL 180 08/01/2016   Lab Results  Component Value Date   HDL 62 08/01/2016   Lab Results  Component Value Date   LDLCALC 96 08/01/2016   Lab Results  Component Value Date   TRIG 111 08/01/2016   Lab Results  Component Value Date   CHOLHDL 2.9 08/01/2016   Lab Results  Component Value Date   HGBA1C 5.8 07/12/2017       Assessment & Plan:   Problem List Items Addressed This Visit    None    Visit Diagnoses    Cervical spine arthritis with nerve pain    -  Primary   Relevant Medications   meloxicam (MOBIC) 7.5 MG tablet   methocarbamol (ROBAXIN) 500 MG tablet   methylPREDNISolone acetate (DEPO-MEDROL) injection 80 mg (Completed)   Other Relevant Orders   DG Cervical Spine Complete (Completed)   Cervical paraspinal muscle spasm       Relevant Medications   meloxicam (MOBIC) 7.5 MG tablet   methocarbamol (ROBAXIN) 500 MG tablet   Other Relevant Orders   Ambulatory referral to Physical Therapy       Meds ordered this encounter  Medications  . meloxicam (MOBIC) 7.5 MG tablet    Sig: Take 1 tablet (7.5 mg total) by mouth daily.    Dispense:  30 tablet    Refill:  0    Order Specific Question:   Supervising Provider    Answer:   Carlota Raspberry, JEFFREY R [[id number]]  . methocarbamol (ROBAXIN) 500 MG tablet    Sig: Take 1 tablet (500 mg total) by mouth 4 (four) times daily.    Dispense:  60 tablet    Refill:  0    Order Specific Question:   Supervising Provider    Answer:   Carlota Raspberry, JEFFREY R [[id number]]  . methylPREDNISolone acetate (DEPO-MEDROL) injection 80 mg   PLAN  Xray shows no acute osseous changes,  degenerative changes mildly progressed from previous imaging  Abnormal curvature suggestive of muscle spasm  meloxicam and methocarbamol  80mg  depo medrol injection IM today in office  Return if worsening or failing to improve  Patient encouraged to call clinic with any questions, comments, or concerns.  Maximiano Coss, NP

## 2020-02-05 ENCOUNTER — Ambulatory Visit: Payer: 59 | Attending: Registered Nurse | Admitting: Physical Therapy

## 2020-02-05 ENCOUNTER — Other Ambulatory Visit: Payer: Self-pay

## 2020-02-05 DIAGNOSIS — M6281 Muscle weakness (generalized): Secondary | ICD-10-CM | POA: Insufficient documentation

## 2020-02-05 DIAGNOSIS — M542 Cervicalgia: Secondary | ICD-10-CM | POA: Diagnosis present

## 2020-02-05 DIAGNOSIS — R293 Abnormal posture: Secondary | ICD-10-CM | POA: Insufficient documentation

## 2020-02-05 DIAGNOSIS — G8929 Other chronic pain: Secondary | ICD-10-CM | POA: Insufficient documentation

## 2020-02-05 DIAGNOSIS — M25512 Pain in left shoulder: Secondary | ICD-10-CM | POA: Insufficient documentation

## 2020-02-05 DIAGNOSIS — M25612 Stiffness of left shoulder, not elsewhere classified: Secondary | ICD-10-CM | POA: Diagnosis present

## 2020-02-05 NOTE — Patient Instructions (Addendum)
Posture Tips DO: - stand tall and erect - keep chin tucked in - keep head and shoulders in alignment - check posture regularly in mirror or large window - pull head back against headrest in car seat;  Change your position often.  Sit with lumbar support. DON'T: - slouch or slump while watching TV or reading - sit, stand or lie in one position  for too long;  Sitting is especially hard on the spine so if you sit at a desk/use the computer, then stand up often!   Copyright  VHI. All rights reserved.  Posture - Standing   Good posture is important. Avoid slouching and forward head thrust. Maintain curve in low back and align ears over shoul- ders, hips over ankles.  Pull your belly button in toward your back bone.   Copyright  VHI. All rights reserved.  Posture - Sitting   Sit upright, head facing forward. Try using a roll to support lower back. Keep shoulders relaxed, and avoid rounded back. Keep hips level with knees. Avoid crossing legs for long periods.     Voncille Lo, PT, [place] Certified Exercise Expert for the Aging Adult  02/05/20 8:41 AM Phone: [phone] Fax: [phone]

## 2020-02-05 NOTE — Therapy (Addendum)
[place], [place], [address] Phone: [phone]   Fax:  [phone]  Physical Therapy Evaluation  Patient Details  Name: Cristina Boyd MRN: [id number] Date of Birth: 12-04-1970 Referring Provider (PT): Maximiano Coss NP   Encounter Date: 02/05/2020   PT End of Session - 02/05/20 0759    Visit Number 1    Number of Visits 8    Date for PT Re-Evaluation 04/01/20    Authorization Type [employer]    PT Start Time 0800    PT Stop Time 0846    PT Time Calculation (min) 46 min    Activity Tolerance Patient limited by pain    Behavior During Therapy [facility] for tasks assessed/performed           Past Medical History:  Diagnosis Date  . Fibroadenoma   . GERD (gastroesophageal reflux disease)     No past surgical history on file.  There were no vitals filed for this visit.    Subjective Assessment - 02/05/20 0807    Subjective I have pain in neck and down both shoulders and when I sleep it sometimes numbness tingling down LT arm to fingers and to shoulder on R. My pain is worse when I lie down. I use a very thin pillow. I cant sleep more than every 2-3 hours. I woke up about 4 months ago and I have had pain . I have tried to use pain medication and heat and cold and yoga every night and every morning. Mr Orland Mustard did injection in shoulder 3 weks ago.  It did not help    Patient is accompained by: Interpreter   H'Tuyet Rahlan   Pertinent History fibroadenoma, GERD    Diagnostic tests x rayMild degenerative disc disease changes at C4-C5 without acuteosseous abnormalities.    Currently in Pain? Yes    Pain Score 8     Pain Location Neck    Pain Orientation Right;Left    Pain Descriptors / Indicators Aching;Sore    Pain Radiating Towards radiates down LT arm down to fingers sometimes but not all the time    Pain Onset More than a month ago    Pain Frequency Constant    Aggravating Factors  sleeping lifting,   maintaining postition as nail technician              [facility] PT Assessment - 02/05/20 0001      Assessment   Medical Diagnosis Cervical  paraspinal muscle spasm    Referring Provider (PT) Maximiano Coss NP    Onset Date/Surgical Date 10/06/19   about 3  yr ago but recent exacerbation after sleep   Hand Dominance Right    Prior Therapy 3 years ago for neck but didnt help      Precautions   Precautions None      Restrictions   Weight Bearing Restrictions No      Balance Screen   Has the patient fallen in the past 6 months No    Has the patient had a decrease in activity level because of a fear of falling?  No    Is the patient reluctant to leave their home because of a fear of falling?  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      Observation/Other Assessments   Focus on Therapeutic Outcomes (FOTO)  Not taken  Language      Posture/Postural Control   Posture/Postural Control Postural limitations    Postural Limitations Rounded Shoulders;Forward head;Anterior pelvic tilt    Posture Comments rt shld more ant than Left      AROM   Overall AROM  Deficits    Right Shoulder Flexion 160 Degrees    Right Shoulder ABduction 155 Degrees    Right Shoulder Internal Rotation 90 Degrees    Right Shoulder External Rotation 65 Degrees    Left Shoulder Flexion 120 Degrees    Left Shoulder ABduction 75 Degrees    Left Shoulder Internal Rotation 40 Degrees   45 abd   Left Shoulder External Rotation 40 Degrees   abd 45   Cervical Flexion 40   pulling pain   Cervical Extension 32   pain   Cervical - Right Side  Bend 18   pain and restriction   Cervical - Left Side Bend 32    Cervical - Right Rotation 50    Cervical - Left Rotation 40   pain     Strength   Overall Strength Deficits    Right Shoulder Flexion 4+/5    Right Shoulder ABduction 4+/5    Right Shoulder Internal Rotation 4+/5    Right Shoulder External Rotation 4+/5    Left Shoulder Flexion 3-/5    Left Shoulder ABduction 3-/5    Left Shoulder Internal Rotation 3-/5    Left Shoulder External Rotation 3-/5    Right Hand Grip (lbs) 52.6   51, 60 47   Left Hand Grip (lbs) 28 lb   30,32 22     Palpation   Palpation comment tightness over cervical paraspinals  significant restriction on posterior capsule and stiffness of LT shoulder                      Objective measurements completed on examination: See above findings.       [facility] Adult PT Treatment/Exercise - 02/05/20 0001      Self-Care   Self-Care Posture    Posture iniital posture sitting and standing                    PT Short Term Goals - 02/05/20 0844      PT SHORT TERM GOAL #1   Title STG=LTG             PT Long Term Goals - 02/05/20 1016      PT LONG TERM GOAL #1   Title Pt witll be IND with advanced HEP given    Baseline no knowledge    Time 8    Period Weeks    Status New    Target Date 04/01/20      PT LONG TERM GOAL #2   Title Pt will improve AROM of Left shoulder to at least 120 degrees and 2/10 or less pain in order to perform duties for household chores    Baseline AROM 75 degrees on L    Time 8    Period Weeks    Status New    Target Date 04/01/20  PT LONG TERM GOAL #3   Title L shoulder IR and ER will return to [facility] to return to pain-free ADLs such as dressing and grooming.    Baseline L IR 40, ER 40    Time 8    Period Weeks    Status New    Target Date 04/01/20      PT LONG TERM GOAL #4   Title Pt will be able to sleep for 5 or more hours of uninterrupted sleep at night for more restorative sleep     Baseline Pt unable to sleep for 2-3 hours at night    Time 8    Period Weeks    Status New    Target Date 04/01/20      PT LONG TERM GOAL #5   Title Pt will be able to lift 25 lb above head in order to perform duties as nail technician and assisting lifting client legs    Baseline unable to lift above 75 degrees flexion    Time 8    Period Weeks    Status New    Target Date 04/01/20                  Plan - 02/05/20 1024    Clinical Impression Statement 49 yo female presents wth neck pain bil and signs and symptoms compatible with impingement and also L adhesive capsulitis with AROM restrictions in L shoulder especially posterior capsule. 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  Pain severely limits her sleep and she is unable to get more than 2-3 hours of sleep at night. Pt is concerned about Pt would benefit from skilled outpatient PT services for 1 times a week for 8 weeks to progress toward pain-free PLOF.    Examination-Activity Limitations Lift;Dressing;Sleep    Examination-Participation Restrictions Occupation    Stability/Clinical Decision Making Stable/Uncomplicated    Clinical Decision Making Low    Rehab Potential Good    PT Frequency 1x / week    PT Duration 8 weeks    PT Treatment/Interventions ADLs/Self Care Home Management;Cryotherapy;Electrical Stimulation;Iontophoresis 4mg /ml Dexamethasone;Moist Heat;Traction;Ultrasound;Therapeutic exercise;Therapeutic activities;Neuromuscular re-education;Patient/family education;Passive range of motion;Manual techniques;Dry needling;Taping;Joint Manipulations    PT Next Visit Plan Manual for adhesive capsulitis,  possible TPDN  progress HEP as  neede    PT Home Exercise Plan [email or web address]    Consulted and Agree with Plan of Care Patient           Patient will benefit from skilled therapeutic intervention in order to improve the following deficits and impairments:  Pain, Impaired UE functional use, Improper body mechanics, Postural dysfunction, Increased muscle spasms, Decreased strength, Decreased range of motion  Visit Diagnosis: Cervicalgia  Stiffness of left shoulder, not elsewhere classified  Muscle weakness (generalized)  Abnormal posture  Chronic left shoulder pain   Access Code: [email or web address]: [email or web address]: 12/02/2021Prepared by: Donnetta Simpers BeardsleyExercises  Supine Shoulder Flexion Extension AAROM with Dowel - 1 x daily - 7 x weekly - 3 sets - 10 reps  Supine Shoulder External Rotation with Dowel - 1 x daily - 7 x weekly - 3 sets - 10 reps  Sleeper Stretch - 2-3 x daily - 7 x weekly - 1 sets - 3  reps - 60 hold  Supine Cervical Retraction with Towel - 2-3 x daily - 7 x weekly - 1 sets - 10 reps - 10 sec hold    Problem List Patient Active Problem List   Diagnosis Date Noted  . Dysmenorrhea 04/11/2018  . Menorrhagia with regular cycle 04/11/2018  . Gallbladder polyp 04/11/2018  . Abdominal pain, epigastric 10/16/2017  . Malaise and fatigue 11/20/2012    Voncille Lo, PT, [place] Certified Exercise Expert for the Aging Adult  02/05/20 10:56 AM Phone: [phone] Fax: [phone]  [facility] [address] [place], [place], [address] Phone: [phone]   Fax:  [phone]  Name: Cristina Boyd MRN: [id number] Date of Birth: [birth year]

## 2020-02-05 NOTE — Addendum Note (Signed)
Addended by: Dorothea Ogle on: 02/05/2020 10:58 AM   Modules accepted: Orders

## 2020-02-08 ENCOUNTER — Other Ambulatory Visit: Payer: Self-pay | Admitting: Registered Nurse

## 2020-02-08 DIAGNOSIS — M62838 Other muscle spasm: Secondary | ICD-10-CM

## 2020-02-08 NOTE — Telephone Encounter (Signed)
Requested Prescriptions  Pending Prescriptions Disp Refills  . meloxicam (MOBIC) 7.5 MG tablet [Pharmacy Med Name: MELOXICAM 7.5MG  TABLETS] 30 tablet 0    Sig: TAKE 1 TABLET(7.5 MG) BY MOUTH DAILY     Analgesics:  COX2 Inhibitors Failed - 02/08/2020 11:13 AM      Failed - Cr in normal range and within 360 days    Creat  Date Value Ref Range Status  07/04/2015 0.54 0.50 - 1.10 mg/dL Final   Creatinine, Ser  Date Value Ref Range Status  03/17/2019 0.53 (L) 0.57 - 1.00 mg/dL Final         Passed - HGB in normal range and within 360 days    Hemoglobin  Date Value Ref Range Status  03/17/2019 12.9 11.1 - 15.9 g/dL Final         Passed - Patient is not pregnant      Passed - Valid encounter within last 12 months    Recent Outpatient Visits          1 month ago Cervical spine arthritis with nerve pain   [facility], Delfino Lovett, NP   10 months ago Pelvic cramping   [facility], Delfino Lovett, NP   1 year ago Abdominal pain, epigastric   [facility], Ellie Lunch, [place]   2 years ago Abdominal pain, epigastric   [facility] at [place], [place]   2 years ago Frequent urination   [facility] at [facility], [place], [place]      Future Appointments            In 1 month Maximiano Coss, NP [facility] at [place], [facility]

## 2020-03-10 ENCOUNTER — Encounter: Payer: Self-pay | Admitting: Registered Nurse

## 2020-03-10 ENCOUNTER — Other Ambulatory Visit: Payer: Self-pay

## 2020-03-10 ENCOUNTER — Ambulatory Visit (INDEPENDENT_AMBULATORY_CARE_PROVIDER_SITE_OTHER): Payer: 59 | Admitting: Registered Nurse

## 2020-03-10 VITALS — BP 109/71 | HR 81 | Temp 98.1°F | Ht 64.0 in | Wt 129.6 lb

## 2020-03-10 DIAGNOSIS — M5010 Cervical disc disorder with radiculopathy, unspecified cervical region: Secondary | ICD-10-CM | POA: Diagnosis not present

## 2020-03-10 DIAGNOSIS — Z0000 Encounter for general adult medical examination without abnormal findings: Secondary | ICD-10-CM

## 2020-03-10 DIAGNOSIS — K219 Gastro-esophageal reflux disease without esophagitis: Secondary | ICD-10-CM

## 2020-03-10 DIAGNOSIS — Z13228 Encounter for screening for other metabolic disorders: Secondary | ICD-10-CM

## 2020-03-10 DIAGNOSIS — Z1329 Encounter for screening for other suspected endocrine disorder: Secondary | ICD-10-CM

## 2020-03-10 DIAGNOSIS — M62838 Other muscle spasm: Secondary | ICD-10-CM

## 2020-03-10 DIAGNOSIS — Z130 Encounter for screening for diseases of the blood and blood-forming organs and certain disorders involving the immune mechanism: Secondary | ICD-10-CM

## 2020-03-10 DIAGNOSIS — Z13220 Encounter for screening for lipoid disorders: Secondary | ICD-10-CM

## 2020-03-10 DIAGNOSIS — Z0001 Encounter for general adult medical examination with abnormal findings: Secondary | ICD-10-CM

## 2020-03-10 MED ORDER — MELOXICAM 7.5 MG PO TABS: ORAL_TABLET | ORAL | 0 refills | Status: DC

## 2020-03-10 MED ORDER — GABAPENTIN 100 MG PO CAPS: 100.0000 mg | ORAL_CAPSULE | Freq: Three times a day (TID) | ORAL | 3 refills | Status: DC | PRN

## 2020-03-10 MED ORDER — METHOCARBAMOL 500 MG PO TABS: 500.0000 mg | ORAL_TABLET | Freq: Four times a day (QID) | ORAL | 0 refills | Status: DC

## 2020-03-10 MED ORDER — OMEPRAZOLE 20 MG PO CPDR: 20.0000 mg | DELAYED_RELEASE_CAPSULE | Freq: Two times a day (BID) | ORAL | 1 refills | Status: DC

## 2020-03-10 NOTE — Patient Instructions (Signed)
° ° ° °  If you have lab work done today you will be contacted with your lab results within the next 2 weeks.  If you have not heard from us then please contact us. The fastest way to get your results is to register for My Chart. ° ° °IF you received an x-ray today, you will receive an invoice from [facility]. Please contact [facility] at [phone] with questions or concerns regarding your invoice.  ° °IF you received labwork today, you will receive an invoice from [facility]. Please contact [facility] at [phone] with questions or concerns regarding your invoice.  ° °Our billing staff will not be able to assist you with questions regarding bills from these companies. ° °You will be contacted with the lab results as soon as they are available. The fastest way to get your results is to activate your My Chart account. Instructions are located on the last page of this paperwork. If you have not heard from us regarding the results in 2 weeks, please contact this office. °  ° ° ° °

## 2020-03-10 NOTE — Progress Notes (Signed)
Established Patient Office Visit  Subjective:  Patient ID: Cristina Boyd, female    DOB: [birth year]  Age: 50 y.o. MRN: [id number]  CC:  Chief Complaint  Patient presents with  . Annual Exam    CPE- wants to check kidney for she has had right flank pain off and on month     HPI Cristina Boyd presents for CPE and labs  Unsure of last CPE Fasting for labs  Has new insurance - wants to replace PT referral as it was too expensive last time Neck pain is the same, no new symptoms  Otherwise feeling well  Past Medical History:  Diagnosis Date  . Allergy    Phreesia 03/07/2020  . Fibroadenoma   . GERD (gastroesophageal reflux disease)     History reviewed. No pertinent surgical history.  Family History  Problem Relation Age of Onset  . Hypertension Mother   . Stroke Mother 7       mild CVA/TIA  . Liver cancer Maternal Grandfather     Social History   Socioeconomic History  . Marital status: Married    Spouse name: Not on file  . Number of children: Not on file  . Years of education: Not on file  . Highest education level: Not on file  Occupational History  . Not on file  Tobacco Use  . Smoking status: Never Smoker  . Smokeless tobacco: Never Used  Vaping Use  . Vaping Use: Never used  Substance and Sexual Activity  . Alcohol use: No  . Drug use: No  . Sexual activity: Yes    Birth control/protection: Condom    Comment: 1st intercourse 6 yo,1 partner  Other Topics Concern  . 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 VITAMIN D PO Take by mouth.    . meloxicam (MOBIC) 7.5 MG tablet TAKE 1 TABLET(7.5 MG) BY MOUTH DAILY 30 tablet 0  . methocarbamol (ROBAXIN) 500 MG tablet Take 1 tablet (500 mg total) by mouth 4 (four) times daily. 60 tablet 0  . omeprazole (PRILOSEC) 20 MG capsule Take 1 capsule (20 mg total) by mouth 2 (two) times daily before a meal. 60 capsule 1  . norgestimate-ethinyl estradiol (ORTHO-CYCLEN) 0.25-35 MG-MCG tablet Take 1 tablet by mouth daily. 3 Package 4   Facility-Administered Medications Prior to Visit  Medication Dose Route Frequency Provider Last Rate Last Admin  . 0.9 %  sodium chloride infusion  500 mL Intravenous Once Nandigam, Kavitha V, MD        No Known Allergies  ROS Review of Systems  Constitutional: Negative.   HENT: Negative.   Eyes: Negative.  Respiratory: Negative.   Cardiovascular: Negative.   Gastrointestinal: Negative.   Genitourinary: Negative.   Musculoskeletal: Positive for neck pain and neck stiffness. Negative for arthralgias, back pain, gait problem, joint swelling and myalgias.  Skin: Negative.   Neurological: Negative.   Psychiatric/Behavioral: Negative.   All other systems reviewed and are negative.     Objective:    Physical Exam Vitals and nursing note reviewed.  Constitutional:      General: She is not in acute distress.    Appearance: Normal appearance. She is normal weight. She is not ill-appearing, toxic-appearing or diaphoretic.  HENT:     Head: Normocephalic and atraumatic.     Right Ear: Tympanic membrane, ear canal and external ear normal. There is no impacted cerumen.     Left Ear: Tympanic membrane, ear canal and external ear normal. There is no impacted cerumen.     Nose: Nose normal.  No congestion or rhinorrhea.     Mouth/Throat:     Mouth: Mucous membranes are moist.     Pharynx: Oropharynx is clear. No oropharyngeal exudate or posterior oropharyngeal erythema.  Eyes:     General: No scleral icterus.       Right eye: No discharge.        Left eye: No discharge.     Extraocular Movements: Extraocular movements intact.     Conjunctiva/sclera: Conjunctivae normal.     Pupils: Pupils are equal, round, and reactive to light.  Neck:     Vascular: No carotid bruit.  Cardiovascular:     Rate and Rhythm: Normal rate and regular rhythm.     Pulses: Normal pulses.     Heart sounds: Normal heart sounds. No murmur heard. No friction rub. No gallop.   Pulmonary:     Effort: Pulmonary effort is normal. No respiratory distress.     Breath sounds: Normal breath sounds. No stridor. No wheezing, rhonchi or rales.  Chest:     Chest wall: No tenderness.  Abdominal:     General: Abdomen is flat. Bowel sounds are normal. There is no distension.     Palpations: Abdomen is soft. There is no mass.     Tenderness: There is no abdominal tenderness. There is no right CVA tenderness, left CVA tenderness, guarding or rebound.     Hernia: No hernia is present.  Musculoskeletal:        General: No swelling, deformity or signs of injury. Normal range of motion.     Cervical back: Normal range of motion. Rigidity and tenderness (msk) present.     Right lower leg: No edema.     Left lower leg: No edema.  Lymphadenopathy:     Cervical: No cervical adenopathy.  Skin:    General: Skin is warm and dry.     Capillary Refill: Capillary refill takes less than 2 seconds.     Coloration: Skin is not jaundiced or pale.     Findings: No bruising, erythema, lesion or rash.  Neurological:     General: No focal deficit present.     Mental Status: She is alert and oriented to person, place, and time. Mental status is at baseline.     Cranial Nerves: No cranial nerve deficit.     Sensory: No sensory  deficit.     Motor: No weakness.     Coordination: Coordination normal.     Gait: Gait normal.     Deep Tendon Reflexes: Reflexes normal.  Psychiatric:  Mood and Affect: Mood normal.        Behavior: Behavior normal.        Thought Content: Thought content normal.        Judgment: Judgment normal.     BP 109/71   Pulse 81   Temp 98.1 F (36.7 C) (Temporal)   Ht _0  (1.626 m)   Wt 129 lb 9.6 oz (58.8 kg)   LMP 02/25/2020   SpO2 98%   BMI 22.25 kg/m  Wt Readings from Last 3 Encounters:  03/10/20 129 lb 9.6 oz (58.8 kg)  01/07/20 129 lb 10.4 oz (58.8 kg)  04/08/19 125 lb (56.7 kg)     Health Maintenance Due  Topic Date Due  . COLONOSCOPY (Pts 45-103yr Insurance coverage will need to be confirmed)  Never done  . PAP SMEAR-Modifier  07/04/2018  . COVID-19 Vaccine (2 - Pfizer 2-dose series) 08/21/2019    There are no preventive care reminders to display for this patient.  Lab Results  Component Value Date   TSH 1.470 07/12/2017   Lab Results  Component Value Date   WBC 6.1 03/17/2019   HGB 12.9 03/17/2019   HCT 38.7 03/17/2019   MCV 94 03/17/2019   PLT 290 03/17/2019   Lab Results  Component Value Date   NA 139 03/17/2019   K 4.0 03/17/2019   CO2 23 03/17/2019   GLUCOSE 93 03/17/2019   BUN 11 03/17/2019   CREATININE 0.53 (L) 03/17/2019   BILITOT 0.2 03/17/2019   ALKPHOS 56 03/17/2019   AST 17 03/17/2019   ALT 14 03/17/2019   PROT 7.6 03/17/2019   ALBUMIN 4.7 03/17/2019   CALCIUM 9.5 03/17/2019   ANIONGAP 11 12/24/2016   Lab Results  Component Value Date   CHOL 180 08/01/2016   Lab Results  Component Value Date   HDL 62 08/01/2016   Lab Results  Component Value Date   LDLCALC 96 08/01/2016   Lab Results  Component Value Date   TRIG 111 08/01/2016   Lab Results  Component Value Date   CHOLHDL 2.9 08/01/2016   Lab Results  Component Value Date   HGBA1C 5.8 07/12/2017      Assessment & Plan:   Problem List Items Addressed  This Visit      Digestive   Gastroesophageal reflux disease without esophagitis   Relevant Medications   omeprazole (PRILOSEC) 20 MG capsule     Nervous and Auditory   Cervical disc disorder with radiculopathy of cervical region   Relevant Medications   methocarbamol (ROBAXIN) 500 MG tablet   gabapentin (NEURONTIN) 100 MG capsule   Other Relevant Orders   Ambulatory referral to Physical Therapy     Other   Cervical paraspinal muscle spasm   Relevant Medications   methocarbamol (ROBAXIN) 500 MG tablet   meloxicam (MOBIC) 7.5 MG tablet   Other Relevant Orders   Ambulatory referral to Physical Therapy    Other Visit Diagnoses    Annual physical exam    -  Primary   Screening for endocrine, metabolic and immunity disorder       Relevant Orders   CMP14+EGFR   Hemoglobin A1c   CBC with Differential/Platelet   TSH   Lipid screening       Relevant Orders   Lipid panel      Meds ordered this encounter  Medications  . methocarbamol (ROBAXIN) 500 MG tablet    Sig: Take 1 tablet (500 mg total) by mouth 4 (four) times daily.  Dispense:  60 tablet    Refill:  0    Order Specific Question:   Supervising Provider    Answer:   Carlota Raspberry, JEFFREY R [[id number]]  . omeprazole (PRILOSEC) 20 MG capsule    Sig: Take 1 capsule (20 mg total) by mouth 2 (two) times daily before a meal.    Dispense:  60 capsule    Refill:  1    Order Specific Question:   Supervising Provider    Answer:   Carlota Raspberry, JEFFREY R [[id number]]  . meloxicam (MOBIC) 7.5 MG tablet    Sig: TAKE 1 TABLET(7.5 MG) BY MOUTH DAILY    Dispense:  30 tablet    Refill:  0    Order Specific Question:   Supervising Provider    Answer:   Carlota Raspberry, JEFFREY R [[id number]]  . gabapentin (NEURONTIN) 100 MG capsule    Sig: Take 1 capsule (100 mg total) by mouth 3 (three) times daily as needed.    Dispense:  90 capsule    Refill:  3    Order Specific Question:   Supervising Provider    Answer:   Carlota Raspberry, JEFFREY R [[id number]]    Follow-up: No  follow-ups on file.   PLAN  CPE finding msk tenderness and rigidity in neck, otherwise unremarkable  Labs collected. Will follow up with the patient as warranted.  Will replace order for PT  Pt declines cologuard - will consider at home. Understands risks of delaying screening.  meds refilled - will try gabapentin for radicular symptoms  Patient encouraged to call clinic with any questions, comments, or concerns.  Maximiano Coss, NP

## 2020-03-11 LAB — CMP14+EGFR
ALT: 35 IU/L — ABNORMAL HIGH (ref 0–32)
AST: 22 IU/L (ref 0–40)
Albumin/Globulin Ratio: 1.6 (ref 1.2–2.2)
Albumin: 4.7 g/dL (ref 3.8–4.8)
Alkaline Phosphatase: 81 IU/L (ref 44–121)
BUN/Creatinine Ratio: 25 — ABNORMAL HIGH (ref 9–23)
BUN: 15 mg/dL (ref 6–24)
Bilirubin Total: <0.2 mg/dL (ref 0.0–1.2)
CO2: 19 mmol/L — ABNORMAL LOW (ref 20–29)
Calcium: 9.8 mg/dL (ref 8.7–10.2)
Chloride: 102 mmol/L (ref 96–106)
Creatinine, Ser: 0.61 mg/dL (ref 0.57–1.00)
GFR calc Af Amer: 124 mL/min/{1.73_m2} (ref >59)
GFR calc non Af Amer: 108 mL/min/{1.73_m2} (ref >59)
Globulin, Total: 2.9 g/dL (ref 1.5–4.5)
Glucose: 114 mg/dL — ABNORMAL HIGH (ref 65–99)
Potassium: 4.3 mmol/L (ref 3.5–5.2)
Sodium: 137 mmol/L (ref 134–144)
Total Protein: 7.6 g/dL (ref 6.0–8.5)

## 2020-03-11 LAB — LIPID PANEL
Chol/HDL Ratio: 4.1 ratio (ref 0.0–4.4)
Cholesterol, Total: 217 mg/dL — ABNORMAL HIGH (ref 100–199)
HDL: 53 mg/dL (ref >39)
LDL Chol Calc (NIH): 125 mg/dL — ABNORMAL HIGH (ref 0–99)
Triglycerides: 220 mg/dL — ABNORMAL HIGH (ref 0–149)
VLDL Cholesterol Cal: 39 mg/dL (ref 5–40)

## 2020-03-11 LAB — CBC WITH DIFFERENTIAL/PLATELET
Basophils Absolute: 0 10*3/uL (ref 0.0–0.2)
Basos: 1 %
EOS (ABSOLUTE): 0.1 10*3/uL (ref 0.0–0.4)
Eos: 2 %
Hematocrit: 39.8 % (ref 34.0–46.6)
Hemoglobin: 12.9 g/dL (ref 11.1–15.9)
Immature Grans (Abs): 0 10*3/uL (ref 0.0–0.1)
Immature Granulocytes: 0 %
Lymphocytes Absolute: 1.5 10*3/uL (ref 0.7–3.1)
Lymphs: 27 %
MCH: 30.7 pg (ref 26.6–33.0)
MCHC: 32.4 g/dL (ref 31.5–35.7)
MCV: 95 fL (ref 79–97)
Monocytes Absolute: 0.4 10*3/uL (ref 0.1–0.9)
Monocytes: 6 %
Neutrophils Absolute: 3.5 10*3/uL (ref 1.4–7.0)
Neutrophils: 64 %
Platelets: 311 10*3/uL (ref 150–450)
RBC: 4.2 x10E6/uL (ref 3.77–5.28)
RDW: 11.6 % — ABNORMAL LOW (ref 11.7–15.4)
WBC: 5.5 10*3/uL (ref 3.4–10.8)

## 2020-03-11 LAB — TSH: TSH: 1.78 u[IU]/mL (ref 0.450–4.500)

## 2020-03-11 LAB — HEMOGLOBIN A1C
Est. average glucose Bld gHb Est-mCnc: 117 mg/dL
Hgb A1c MFr Bld: 5.7 % — ABNORMAL HIGH (ref 4.8–5.6)

## 2020-04-20 ENCOUNTER — Other Ambulatory Visit: Payer: Self-pay

## 2020-04-20 ENCOUNTER — Encounter: Payer: Self-pay | Admitting: Registered Nurse

## 2020-04-20 ENCOUNTER — Ambulatory Visit (INDEPENDENT_AMBULATORY_CARE_PROVIDER_SITE_OTHER): Payer: 59 | Admitting: Registered Nurse

## 2020-04-20 ENCOUNTER — Telehealth: Payer: Self-pay | Admitting: Registered Nurse

## 2020-04-20 VITALS — BP 111/74 | HR 72 | Temp 97.9°F | Resp 18 | Ht 64.0 in | Wt 129.6 lb

## 2020-04-20 DIAGNOSIS — R300 Dysuria: Secondary | ICD-10-CM | POA: Diagnosis not present

## 2020-04-20 DIAGNOSIS — R1114 Bilious vomiting: Secondary | ICD-10-CM | POA: Diagnosis not present

## 2020-04-20 LAB — POCT URINALYSIS DIP (CLINITEK)
Bilirubin, UA: NEGATIVE
Blood, UA: NEGATIVE
Glucose, UA: NEGATIVE mg/dL
Ketones, POC UA: NEGATIVE mg/dL
Leukocytes, UA: NEGATIVE
Nitrite, UA: NEGATIVE
POC PROTEIN,UA: NEGATIVE
Spec Grav, UA: 1.025 (ref 1.010–1.025)
Urobilinogen, UA: 0.2 E.U./dL
pH, UA: 7 (ref 5.0–8.0)

## 2020-04-20 NOTE — Telephone Encounter (Signed)
errow

## 2020-04-20 NOTE — Patient Instructions (Signed)
° ° ° °  If you have lab work done today you will be contacted with your lab results within the next 2 weeks.  If you have not heard from us then please contact us. The fastest way to get your results is to register for My Chart. ° ° °IF you received an x-ray today, you will receive an invoice from [facility]. Please contact [facility] at [phone] with questions or concerns regarding your invoice.  ° °IF you received labwork today, you will receive an invoice from [facility]. Please contact [facility] at [phone] with questions or concerns regarding your invoice.  ° °Our billing staff will not be able to assist you with questions regarding bills from these companies. ° °You will be contacted with the lab results as soon as they are available. The fastest way to get your results is to activate your My Chart account. Instructions are located on the last page of this paperwork. If you have not heard from us regarding the results in 2 weeks, please contact this office. °  ° ° ° °

## 2020-04-20 NOTE — Progress Notes (Signed)
Acute Office Visit  Subjective:    Patient ID: Cristina Boyd, female    DOB: 08/26/1970, 50 y.o.   MRN: [id number]  Chief Complaint  Patient presents with  . Abdominal Pain    Patient states she has been having some abdominal pain for about 1 month.    HPI Patient is in today for abdominal pain  Onset around 1 mo ago Not GERD pain - this has been under control with omeprazole Now having pain across lower abdomen and R flank No changes to bowel or bladder habits, no hematuria or dysuria, no vaginal symptoms Medical history is shown to include a cervical polyp and 2 uterine leiomyomatas noted on ultrasound early last year - Pt states menses have been regular without any more cramping or pain than previously.  Does note sometimes pain radiates towards her back. Pain does wake her at night at times.  We had discussed colon ca screening at her last appt, which she declined at that time. She also continues to decline cervical cancer screening and pelvic exam   Past Medical History:  Diagnosis Date  . Allergy    [facility]ia 03/07/2020  . Fibroadenoma   . GERD (gastroesophageal reflux disease)     No past surgical history on file.  Family History  Problem Relation Age of Onset  . Hypertension Mother   . Stroke Mother 35       mild CVA/TIA  . Liver cancer Maternal Grandfather     Social History   Socioeconomic History  . Marital status: Married    Spouse name: Not on file  . Number of children: Not on file  . Years of education: Not on file  . Highest education level: Not on file  Occupational History  . Not on file  Tobacco Use  . Smoking status: Never Smoker  . Smokeless tobacco: Never Used  Vaping Use  . Vaping Use: Never used  Substance and Sexual Activity  . Alcohol use: No  . Drug use: No  . Sexual activity: Yes    Birth control/protection: Condom    Comment: 1st intercourse 58 yo,1 partner  Other Topics Concern  . 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 gabapentin (NEURONTIN) 100 MG capsule Take 1 capsule (100 mg total) by mouth 3 (three) times daily as needed. 90 capsule 3  . meloxicam (MOBIC) 7.5 MG tablet TAKE 1 TABLET(7.5 MG) BY MOUTH DAILY 30 tablet 0  . methocarbamol (ROBAXIN) 500 MG tablet Take 1 tablet (500 mg total) by mouth 4 (four) times daily. 60 tablet 0  . omeprazole (PRILOSEC) 20 MG capsule Take 1 capsule (  20 mg total) by mouth 2 (two) times daily before a meal. 60 capsule 1  . VITAMIN D PO Take by mouth.     Facility-Administered Medications Prior to Visit  Medication Dose Route Frequency Provider Last Rate Last Admin  . 0.9 %  sodium chloride infusion  500 mL Intravenous Once Nandigam, Venia Minks, MD        No Known Allergies  Review of Systems Per hpi      Objective:    Physical Exam Vitals and nursing note reviewed.  Constitutional:      General: She is not in acute distress.    Appearance: Normal appearance. She is not ill-appearing, toxic-appearing or diaphoretic.  Cardiovascular:     Rate and Rhythm: Normal rate and regular rhythm.     Pulses: Normal pulses.     Heart sounds: Normal heart sounds. No murmur heard. No friction rub. No gallop.   Pulmonary:     Effort: Pulmonary effort is normal. No respiratory distress.      Breath sounds: Normal breath sounds. No stridor. No wheezing, rhonchi or rales.  Chest:     Chest wall: No tenderness.  Abdominal:     General: Bowel sounds are normal.     Tenderness: There is abdominal tenderness in the right lower quadrant, suprapubic area and left lower quadrant. There is right CVA tenderness. There is no left CVA tenderness, guarding or rebound. Negative signs include Murphy's sign, Rovsing's sign, McBurney's sign, psoas sign and obturator sign.     Hernia: No hernia is present.  Genitourinary:    Comments: Declined by patient Skin:    General: Skin is warm and dry.     Capillary Refill: Capillary refill takes less than 2 seconds.  Neurological:     General: No focal deficit present.     Mental Status: She is alert and oriented to person, place, and time. Mental status is at baseline.  Psychiatric:        Mood and Affect: Mood normal.        Behavior: Behavior normal.        Thought Content: Thought content normal.        Judgment: Judgment normal.     BP 111/74   Pulse 72   Temp 97.9 F (36.6 C) (Temporal)   Resp 18   Ht 5\' 4"  (1.626 m)   Wt 129 lb 9.6 oz (58.8 kg)   SpO2 100%   BMI 22.25 kg/m  Wt Readings from Last 3 Encounters:  04/20/20 129 lb 9.6 oz (58.8 kg)  03/10/20 129 lb 9.6 oz (58.8 kg)  01/07/20 129 lb 10.4 oz (58.8 kg)    There are no preventive care reminders to display for this patient.  There are no preventive care reminders to display for this patient.   Lab Results  Component Value Date   TSH 1.780 03/10/2020   Lab Results  Component Value Date   WBC 5.5 03/10/2020   HGB 12.9 03/10/2020   HCT 39.8 03/10/2020   MCV 95 03/10/2020   PLT 311 03/10/2020   Lab Results  Component Value Date   NA 137 03/10/2020   K 4.3 03/10/2020   CO2 19 (L) 03/10/2020   GLUCOSE 114 (H) 03/10/2020   BUN 15 03/10/2020   CREATININE 0.61 03/10/2020   BILITOT <0.2 03/10/2020   ALKPHOS 81 03/10/2020   AST 22 03/10/2020   ALT 35 (H)  03/10/2020   PROT 7.6 03/10/2020   ALBUMIN 4.7 03/10/2020  CALCIUM 9.8 03/10/2020   ANIONGAP 11 12/24/2016   Lab Results  Component Value Date   CHOL 217 (H) 03/10/2020   Lab Results  Component Value Date   HDL 53 03/10/2020   Lab Results  Component Value Date   LDLCALC 125 (H) 03/10/2020   Lab Results  Component Value Date   TRIG 220 (H) 03/10/2020   Lab Results  Component Value Date   CHOLHDL 4.1 03/10/2020   Lab Results  Component Value Date   HGBA1C 5.7 (H) 03/10/2020       Assessment & Plan:   Problem List Items Addressed This Visit   None   Visit Diagnoses    Dysuria    -  Primary   Relevant Orders   POCT URINALYSIS DIP (CLINITEK) (Completed)   Bilious vomiting with nausea       Relevant Orders   CT Abdomen Pelvis Wo Contrast       No orders of the defined types were placed in this encounter.  PLAN  poct ua wnl  Diffuse abdominal pain with radiation to her back that wakes her at night is concerning - will order CT abd pelvis and follow closely  .encourage hydration and close monitoring of symptoms for any patterns. Tylenol around the clock for pain relief.  Patient encouraged to call clinic with any questions, comments, or concerns.   Maximiano Coss, NP

## 2020-04-27 ENCOUNTER — Telehealth: Payer: Self-pay | Admitting: Registered Nurse

## 2020-04-27 NOTE — Telephone Encounter (Signed)
Do you know what is happening with this order and authorization?

## 2020-04-27 NOTE — Telephone Encounter (Signed)
Received call from [facility] regarding order for CT of abdomen pelvis without contrast. Patient's CT is scheduled for 2/25 but order not approved. Melissa stated they have approval for something else.   Please clarify order with Melissa at [phone], ext [phone].

## 2020-04-30 ENCOUNTER — Ambulatory Visit (HOSPITAL_COMMUNITY): Payer: 59

## 2020-05-03 NOTE — Telephone Encounter (Signed)
Just seeing this message. Patient was suppose to have an CT oF the abdomen that was not approved.

## 2020-05-04 NOTE — Telephone Encounter (Signed)
Will check into this. Can reorder or place referral if approval not possible.  Thanks,  Denice Paradise

## 2020-05-05 NOTE — Telephone Encounter (Signed)
Patient CT was approved according to the referral .

## 2021-09-08 ENCOUNTER — Encounter (HOSPITAL_BASED_OUTPATIENT_CLINIC_OR_DEPARTMENT_OTHER): Payer: Self-pay

## 2021-09-08 ENCOUNTER — Emergency Department (HOSPITAL_BASED_OUTPATIENT_CLINIC_OR_DEPARTMENT_OTHER)
Admission: EM | Admit: 2021-09-08 | Discharge: 2021-09-08 | Disposition: A | Discharge status: Home / Self Care | Payer: 59 | Attending: Emergency Medicine | Admitting: Emergency Medicine

## 2021-09-08 ENCOUNTER — Other Ambulatory Visit: Payer: Self-pay

## 2021-09-08 ENCOUNTER — Emergency Department (HOSPITAL_BASED_OUTPATIENT_CLINIC_OR_DEPARTMENT_OTHER): Payer: 59

## 2021-09-08 DIAGNOSIS — R109 Unspecified abdominal pain: Secondary | ICD-10-CM | POA: Diagnosis present

## 2021-09-08 LAB — COMPREHENSIVE METABOLIC PANEL
ALT: 23 U/L (ref 0–44)
AST: 20 U/L (ref 15–41)
Albumin: 5.2 g/dL — ABNORMAL HIGH (ref 3.5–5.0)
Alkaline Phosphatase: 59 U/L (ref 38–126)
Anion gap: 12 (ref 5–15)
BUN: 11 mg/dL (ref 6–20)
CO2: 28 mmol/L (ref 22–32)
Calcium: 10.6 mg/dL — ABNORMAL HIGH (ref 8.9–10.3)
Chloride: 102 mmol/L (ref 98–111)
Creatinine, Ser: 0.67 mg/dL (ref 0.44–1.00)
GFR, Estimated: >60 mL/min (ref >60)
Glucose, Bld: 128 mg/dL — ABNORMAL HIGH (ref 70–99)
Potassium: 3.6 mmol/L (ref 3.5–5.1)
Sodium: 142 mmol/L (ref 135–145)
Total Bilirubin: 0.7 mg/dL (ref 0.3–1.2)
Total Protein: 9.1 g/dL — ABNORMAL HIGH (ref 6.5–8.1)

## 2021-09-08 LAB — URINALYSIS, ROUTINE W REFLEX MICROSCOPIC
Bilirubin Urine: NEGATIVE
Glucose, UA: NEGATIVE mg/dL
Hgb urine dipstick: NEGATIVE
Ketones, ur: NEGATIVE mg/dL
Leukocytes,Ua: NEGATIVE
Nitrite: NEGATIVE
Protein, ur: NEGATIVE mg/dL
Specific Gravity, Urine: <1.005 — ABNORMAL LOW (ref 1.005–1.030)
pH: 6.5 (ref 5.0–8.0)

## 2021-09-08 LAB — CBC
HCT: 43.5 % (ref 36.0–46.0)
Hemoglobin: 13.6 g/dL (ref 12.0–15.0)
MCH: 29.6 pg (ref 26.0–34.0)
MCHC: 31.3 g/dL (ref 30.0–36.0)
MCV: 94.6 fL (ref 80.0–100.0)
Platelets: 285 10*3/uL (ref 150–400)
RBC: 4.6 MIL/uL (ref 3.87–5.11)
RDW: 11.9 % (ref 11.5–15.5)
WBC: 3.9 10*3/uL — ABNORMAL LOW (ref 4.0–10.5)
nRBC: 0 % (ref 0.0–0.2)

## 2021-09-08 LAB — PREGNANCY, URINE: Preg Test, Ur: NEGATIVE

## 2021-09-08 LAB — LIPASE, BLOOD: Lipase: 19 U/L (ref 11–51)

## 2021-09-08 MED ORDER — OXYCODONE-ACETAMINOPHEN 5-325 MG PO TABS: 1.0000 | ORAL_TABLET | Freq: Once | ORAL | Status: DC

## 2021-09-08 MED ORDER — LACTATED RINGERS IV BOLUS
500.0000 mL | Freq: Once | INTRAVENOUS | Status: AC
  Administered 2021-09-08: 500 mL via INTRAVENOUS

## 2021-09-08 NOTE — ED Triage Notes (Signed)
Onset 2 to 3 days Right side flank pain associated with lower abd. Pain described as cramping.  States having urinary frequency.  Nausea no vomiting

## 2021-09-08 NOTE — ED Provider Notes (Signed)
[facility] EMERGENCY DEPT Provider Note   CSN: [id number] Arrival date & time: 09/08/21  0746     History  Chief Complaint  Patient presents with   Flank Pain    Cristina Boyd is a 51 y.o. female.  HPI Level 5 caveat secondary to language barrier History is obtained through translator This is a 51 year old female who presents today complaining of right flank pain that radiates to the suprapubic region.  States she has had similar pain in the past and normally comes with her menstrual cycle.  However, this time it has occurred outside of her menstrual cycle.  Last menses was 2 weeks ago.  She has had pain off and on since her last menstrual cycle.  It radiates from the flank to the groin.  It comes and goes without instigating or relieving factors.  She has not taken anything for that.  It is severe when it comes on and she has worsened pain with walking during that time.  She has had some urinary tract infection symptoms but has not noted any blood.  She has not had any prior known gallstones or kidney stones.  She denies chest pain, shortness of breath, fever, endorses some chills, no leg pain or swelling, no history of DVT or PE.  Does not think that she could be pregnant.     Home Medications Prior to Admission medications   Medication Sig Start Date End Date Taking? Authorizing Provider  gabapentin (NEURONTIN) 100 MG capsule Take 1 capsule (100 mg total) by mouth 3 (three) times daily as needed. 03/10/20   Maximiano Coss, NP  meloxicam (MOBIC) 7.5 MG tablet TAKE 1 TABLET(7.5 MG) BY MOUTH DAILY 03/10/20   Maximiano Coss, NP  methocarbamol (ROBAXIN) 500 MG tablet Take 1 tablet (500 mg total) by mouth 4 (four) times daily. 03/10/20   Maximiano Coss, NP  omeprazole (PRILOSEC) 20 MG capsule Take 1 capsule (20 mg total) by mouth 2 (two) times daily before a meal. 03/10/20   Maximiano Coss, NP  VITAMIN D PO Take by mouth.    [provider]      Allergies    Patient has  no known allergies.    Review of Systems   Review of Systems  Physical Exam Updated Vital Signs BP 116/75   Pulse 64   Temp 98 F (36.7 C) (Oral)   Resp 16   Ht 1.626 m ('5\' 4"' )   Wt 58.5 kg   LMP 08/23/2021 (Exact Date)   SpO2 99%   BMI 22.14 kg/m  Physical Exam Vitals and nursing note reviewed.  Constitutional:      General: She is not in acute distress.    Appearance: Normal appearance. She is well-developed.  HENT:     Head: Normocephalic and atraumatic.     Right Ear: External ear normal.     Left Ear: External ear normal.     Nose: Nose normal.  Eyes:     Conjunctiva/sclera: Conjunctivae normal.     Pupils: Pupils are equal, round, and reactive to light.  Cardiovascular:     Rate and Rhythm: Normal rate and regular rhythm.     Pulses: Normal pulses.  Pulmonary:     Effort: Pulmonary effort is normal.  Abdominal:     General: Abdomen is flat. Bowel sounds are normal.     Palpations: Abdomen is soft. There is no mass.     Tenderness: There is no abdominal tenderness.  Musculoskeletal:  General: Normal range of motion.     Cervical back: Normal range of motion and neck supple.  Skin:    General: Skin is warm and dry.  Neurological:     Mental Status: She is alert and oriented to person, place, and time.     Motor: No abnormal muscle tone.     Coordination: Coordination normal.  Psychiatric:        Behavior: Behavior normal.        Thought Content: Thought content normal.     ED Results / Procedures / Treatments   Labs (all labs ordered are listed, but only abnormal results are displayed) Labs Reviewed  URINALYSIS, ROUTINE W REFLEX MICROSCOPIC - Abnormal; Notable for the following components:      Result Value   Color, Urine COLORLESS (*)    Specific Gravity, Urine <1.005 (*)    All other components within normal limits  CBC - Abnormal; Notable for the following components:   WBC 3.9 (*)    All other components within normal limits   COMPREHENSIVE METABOLIC PANEL - Abnormal; Notable for the following components:   Glucose, Bld 128 (*)    Calcium 10.6 (*)    Total Protein 9.1 (*)    Albumin 5.2 (*)    All other components within normal limits  PREGNANCY, URINE  LIPASE, BLOOD    EKG None  Radiology CT Renal Stone Study  Result Date: 09/08/2021 CLINICAL DATA:  RIGHT flank pain and pelvic pain for 2 weeks, cramping, urinary frequency, nausea, question kidney stone EXAM: CT ABDOMEN AND PELVIS WITHOUT CONTRAST TECHNIQUE: Multidetector CT imaging of the abdomen and pelvis was performed following the standard protocol without IV contrast. RADIATION DOSE REDUCTION: This exam was performed according to the departmental dose-optimization program which includes automated exposure control, adjustment of the mA and/or kV according to patient size and/or use of iterative reconstruction technique. COMPARISON:  12/24/2016 FINDINGS: Lower chest: Lung bases clear Hepatobiliary: Gallbladder and liver normal appearance Pancreas: Normal appearance Spleen: Normal appearance Adrenals/Urinary Tract: Adrenal glands, kidneys, ureters, and bladder normal appearance. No urinary tract calcification or dilatation. Stomach/Bowel: Normal appendix. Stomach and bowel loops normal appearance. Vascular/Lymphatic: Aorta normal caliber. Scattered pelvic phleboliths. No adenopathy. Reproductive: Unremarkable uterus and ovaries Other: No free air or free fluid. No inflammatory process. Tiny umbilical hernia containing fat. Musculoskeletal: Osseous structures unremarkable. IMPRESSION: Tiny umbilical hernia containing fat. Otherwise negative exam. Electronically Signed   By: Lavonia Dana M.D.   On: 09/08/2021 08:48    Procedures Procedures    Medications Ordered in ED Medications  lactated ringers bolus 500 mL (0 mLs Intravenous Stopped 09/08/21 0912)    ED Course/ Medical Decision Making/ A&P Clinical Course as of 09/09/21 0912  Thu Sep 08, 2021  0929  Comprehensive metabolic panel(!) C-Met reviewed and interpreted with mild hyperglycemia and hypercalcemia will need outpatient follow-up [DR]  0929 Urinalysis, Routine w reflex microscopic Urine, Clean Catch(!) Urinalysis reviewed interpreted no evidence of acute abnormality [DR]  0929 CBC(!) CBC reviewed interpreted with mild leukopenia [DR]  0930 CT reviewed interpreted no evidence of acute abnormality except for tiny umbilical hernia which is unlikely to represent patient source of pain [DR]    Clinical Course User Index [DR] Pattricia Boss, MD                           Medical Decision Making 51 year old female presents today with right flank pain to right pelvis.  Patient has had  multiple similar events in the past but new usually occur with her menstrual cycle.  This is abnormal and this is not occurring with her menstrual cycle.  Differential diagnosis includes but is not limited to diseases of the right upper quadrant including gallbladder, liver, renal colic, urinary tract infection, musculoskeletal etiologies, diseases of the lower lung on the right, diseases of the female reproductive organs, pregnancy, tubal pregnancy, appendicitis Doubt appendicitis as no tenderness noted on her exam and CT obtained with a normal appendix per radiologist No evidence of gallstones seen on CT scan, normal LFTs and bilirubin No evidence of ureteral colic with no stone seen in the right kidney, right ureter, or bladder No evidence of urinary tract infection Lower lungs visualized on CT scan without evidence of acute abnormalities Suspect that this is related to her prior pain with menstrual cycles and patient may be perimenopausal as she is 51. Patient given Percocet here. Discussed return precautions and need for follow-up and voices understanding  Amount and/or Complexity of Data Reviewed Labs: ordered. Decision-making details documented in ED Course. Radiology: ordered and independent  interpretation performed. Decision-making details documented in ED Course.  Risk Prescription drug management.           Final Clinical Impression(s) / ED Diagnoses Final diagnoses:  Right flank pain    Rx / DC Orders ED Discharge Orders     None         Pattricia Boss, MD 09/09/21 [phone]

## 2021-09-08 NOTE — Discharge Instructions (Signed)
No definite cause of your pain was found today.  You had a CAT scan which did not show any evidence of gallstones, kidney stones, or appendicitis. Urine is clear.  Your labs show a slightly elevated calcium that is probably due to a laboratory variance.  Please have your blood count rechecked at your doctor. Return to the emergency department if you are having worsening symptoms especially increased pain, fever, or inability to tolerate fluids. Please use acetaminophen and ibuprofen at home for pain.

## 2021-09-08 NOTE — ED Notes (Signed)
Patient verbalizes understanding of discharge instructions. Opportunity for questioning and answers were provided. Patient discharged from ED.  °

## 2021-09-12 ENCOUNTER — Ambulatory Visit (INDEPENDENT_AMBULATORY_CARE_PROVIDER_SITE_OTHER): Payer: 59 | Admitting: Family Medicine

## 2021-09-12 ENCOUNTER — Encounter: Payer: Self-pay | Admitting: Gastroenterology

## 2021-09-12 ENCOUNTER — Encounter: Payer: Self-pay | Admitting: Family Medicine

## 2021-09-12 VITALS — BP 102/60 | HR 63 | Wt 129.0 lb

## 2021-09-12 DIAGNOSIS — Z1231 Encounter for screening mammogram for malignant neoplasm of breast: Secondary | ICD-10-CM

## 2021-09-12 DIAGNOSIS — R7303 Prediabetes: Secondary | ICD-10-CM

## 2021-09-12 DIAGNOSIS — R1011 Right upper quadrant pain: Secondary | ICD-10-CM | POA: Diagnosis not present

## 2021-09-12 DIAGNOSIS — Z1211 Encounter for screening for malignant neoplasm of colon: Secondary | ICD-10-CM

## 2021-09-12 DIAGNOSIS — R1031 Right lower quadrant pain: Secondary | ICD-10-CM

## 2021-09-12 DIAGNOSIS — R32 Unspecified urinary incontinence: Secondary | ICD-10-CM

## 2021-09-12 LAB — POCT UA - MICROSCOPIC ONLY
Bacteria, U Microscopic: NONE SEEN
WBC, Ur, HPF, POC: NONE SEEN (ref 0–5)

## 2021-09-12 LAB — POCT URINALYSIS DIP (MANUAL ENTRY)
Bilirubin, UA: NEGATIVE
Glucose, UA: NEGATIVE mg/dL
Ketones, POC UA: NEGATIVE mg/dL
Leukocytes, UA: NEGATIVE
Nitrite, UA: NEGATIVE
Protein Ur, POC: NEGATIVE mg/dL
Spec Grav, UA: 1.01 (ref 1.010–1.025)
Urobilinogen, UA: 0.2 E.U./dL
pH, UA: 7 (ref 5.0–8.0)

## 2021-09-12 NOTE — Patient Instructions (Signed)
It was great seeing you today!  You came in to establish care and discuss your abdominal pain.  We are checking your labs and urine, and I will call you if anything is abnormal we will send a MyChart message if normal.  For your urinary leakage you can do the pelvic floor exercises we talked about several times a day.  Try to hold the contraction for about 10 seconds.  Please check-out at the front desk before leaving the clinic. I'd like to see you back in about a week for your pap smear and to follow up on abdominal pain, but if you need to be seen earlier than that for any new issues we're happy to fit you in, just give [place] a call!  Feel free to call with any questions or concerns at any time, at [phone].   Take care,  Dr. Shary [facility]   Th?t tuy?t khi g?p b?n hm nay!  B?n ??n ?? thi?t l?p ch?m [place] v th?o lu?n v? c?n ?au b?ng c?a b?n. Chng ti ?ang ki?m tra phng th nghi?m c?a b?n v ti s? g?i cho b?n n?u c b?t k? ?i?u g b?t th??ng, chng ti s? g?i tin nh?n MyChart n?u bnh th??ng.  ??i v?i tnh tr?ng r r? n??c ti?u, b?n c th? th?c hi?n cc bi t?p sn ch?u m chng ti ? ?? c?p nhi?u l?n trong ngy. C? g?ng gi? c?n co trong kho?ng 10 giy.  Vui lng tr? phng t?i qu?y l? tn tr??c khi r?i phng khm. Ti mu?n g?p l?i b?n sau kho?ng m?t tu?n ?? l?y ph?t t? bo c? t? cung v theo di tnh tr?ng ?au b?ng, nh?ng n?u b?n c?n ???c khm s?m h?n v b?t k? v?n ?? m?i no, chng ti r?t s?n lng h? tr? b?n, ch? c?n cho chng ti bi?t m?t cu?c g?i!  Vui lng g?i n?u c b?t k? cu h?i ho?c th?c m?c no vo b?t k? lc no, theo s? [phone].   B?o tr?ng, Ti?n s? [place]

## 2021-09-12 NOTE — Progress Notes (Unsigned)
    SUBJECTIVE:   CHIEF COMPLAINT / HPI:   Cristina Boyd is a 51 yo who presents to establish care.  Accompanied by daughter who is married to an internal medicine resident at [facility] and home referred them to our clinic.  Would like to discuss her abdominal pain has been chronic but states it is worse in last 2-3 weeks. Pain on right sided abdomen.  Started about 3 years ago.Pain not worsened with eating but did have some pain after drinking last week. Pain occurs 2 weeks before cycle, during cycle, 1 week after cycle. Pain not worsened during cycle. LMP: 6/20. Does have cycles every month. Feels her bleeding is normal. Lasts 4 days. Denies hot flashes. Denies diarrhea , constipation, blood in stool. Normal urination, no dysuria, does have some urinary leakage   With regards to urinary leakage, patient states for a while now she has urine leakage particularly when coughing or when exercising/jumping down.  Anything for this.  Would like to check A1c, pap smear   Prediabetes: a1c 5.7 1 year ago  Exercises using you tube videos   Denies tobacco use, alcohol use, recreational drug use   Not currently taking medications   Denies having a colonoscopy. Due for mammogram last one 2 years ago     OBJECTIVE:   BP 102/60   Pulse 63   Wt 129 lb (58.5 kg)   LMP 08/23/2021 (Exact Date)   SpO2 99%   BMI 22.14 kg/m    Physical exam General: well appearing, NAD Cardiovascular: RRR, no murmurs Lungs: CTAB. Normal WOB Abdomen: soft, non-distended, non-tender Skin: warm, dry. No edema  ASSESSMENT/PLAN:   Encounter to establish care Patient presents to establish care, referred by son-in-law.  Past medical history reviewed in detail.  Discussed nature of residency clinic.  Questions and concerns answered.  Abdominal pain Patient with several years of right-sided abdominal pain that is worsened over the last couple of weeks.  Denies any urinary symptoms, diarrhea or constipation.  Does not seem  to be worsened with menstrual cycles. Was seen in the ED on 7/6 and CT obtained which was normal without evidence of appendicitis, gallbladder abnormalities, kidney stones.  Daughter wondering if it could be acute interstitial cystitis and will obtain UA to ensure not an infection but this is certainly a possibility though less likely given patient does not have urinary frequency.  Potentially be gynecologic, but does not seem to worsen during her menstrual cycles.  Is likely to be PID given chronicity. She is due for a Pap smear which she is going to return in the next couple of weeks to get it done, and will check for potential pelvic causes of pain.   Urinary incontinence Likely due to stress incontinence given the leakage occurs when she coughs or when she exercises.  Does have history of several vaginal deliveries.  Does feel the signal of needing to urinate.  Denies urinary frequency. Has not tried anything for this.  Discussed Kegel's and how to do them to help strengthen the pelvic floor.      [place]

## 2021-09-13 LAB — CBC
Hematocrit: 42.2 % (ref 34.0–46.6)
Hemoglobin: 13.5 g/dL (ref 11.1–15.9)
MCH: 30.1 pg (ref 26.6–33.0)
MCHC: 32 g/dL (ref 31.5–35.7)
MCV: 94 fL (ref 79–97)
Platelets: 281 10*3/uL (ref 150–450)
RBC: 4.49 x10E6/uL (ref 3.77–5.28)
RDW: 11.7 % (ref 11.7–15.4)
WBC: 4.8 10*3/uL (ref 3.4–10.8)

## 2021-09-13 LAB — LIPID PANEL
Chol/HDL Ratio: 3.8 ratio (ref 0.0–4.4)
Cholesterol, Total: 204 mg/dL — ABNORMAL HIGH (ref 100–199)
HDL: 53 mg/dL (ref >39)
LDL Chol Calc (NIH): 126 mg/dL — ABNORMAL HIGH (ref 0–99)
Triglycerides: 141 mg/dL (ref 0–149)
VLDL Cholesterol Cal: 25 mg/dL (ref 5–40)

## 2021-09-13 LAB — HEMOGLOBIN A1C
Est. average glucose Bld gHb Est-mCnc: 126 mg/dL
Hgb A1c MFr Bld: 6 % — ABNORMAL HIGH (ref 4.8–5.6)

## 2021-09-13 LAB — HCV AB W REFLEX TO QUANT PCR: HCV Ab: NONREACTIVE

## 2021-09-13 LAB — HCV INTERPRETATION

## 2021-09-14 ENCOUNTER — Ambulatory Visit: Payer: 59

## 2021-09-14 DIAGNOSIS — R32 Unspecified urinary incontinence: Secondary | ICD-10-CM | POA: Insufficient documentation

## 2021-09-14 DIAGNOSIS — R109 Unspecified abdominal pain: Secondary | ICD-10-CM | POA: Insufficient documentation

## 2021-09-14 NOTE — Assessment & Plan Note (Signed)
Likely due to stress incontinence given the leakage occurs when she coughs or when she exercises.  Does have history of several vaginal deliveries.  Does feel the signal of needing to urinate.  Denies urinary frequency. Has not tried anything for this.  Discussed Kegel's and how to do them to help strengthen the pelvic floor.

## 2021-09-14 NOTE — Assessment & Plan Note (Signed)
Patient with several years of right-sided abdominal pain that is worsened over the last couple of weeks.  Denies any urinary symptoms, diarrhea or constipation.  Does not seem to be worsened with menstrual cycles. Was seen in the ED on 7/6 and CT obtained which was normal without evidence of appendicitis, gallbladder abnormalities, kidney stones.  Daughter wondering if it could be acute interstitial cystitis and will obtain UA to ensure not an infection but this is certainly a possibility though less likely given patient does not have urinary frequency.  Potentially be gynecologic, but does not seem to worsen during her menstrual cycles.  Is likely to be PID given chronicity. She is due for a Pap smear which she is going to return in the next couple of weeks to get it done, and will check for potential pelvic causes of pain.

## 2021-09-22 ENCOUNTER — Ambulatory Visit: Payer: 59 | Admitting: Family Medicine

## 2021-11-03 ENCOUNTER — Encounter: Payer: 59 | Admitting: Gastroenterology

## 2022-05-09 ENCOUNTER — Encounter: Payer: Self-pay | Admitting: Family Medicine

## 2022-05-09 ENCOUNTER — Ambulatory Visit (INDEPENDENT_AMBULATORY_CARE_PROVIDER_SITE_OTHER): Payer: Self-pay | Admitting: Family Medicine

## 2022-05-09 VITALS — BP 96/67 | HR 63 | Temp 98.4°F | Wt 130.0 lb

## 2022-05-09 DIAGNOSIS — J0100 Acute maxillary sinusitis, unspecified: Secondary | ICD-10-CM

## 2022-05-09 MED ORDER — AMOXICILLIN-POT CLAVULANATE 500-125 MG PO TABS: 1.0000 | ORAL_TABLET | Freq: Three times a day (TID) | ORAL | 0 refills | Status: AC

## 2022-05-09 NOTE — Patient Instructions (Signed)
It was wonderful to see you today.  Please bring ALL of your medications with you to every visit.   Updates from today's visit:  Please take Augmentin 3 times a day (every 8 hours) for 7 days to treat your sinus infection.  Please complete the course of antibiotics even if your symptoms improve before then.  Please give our office a call if you do not have improvement after completing your course of antibiotics.  Please seek medical attention if you start to develop any trouble breathing or pain in your chest.  Please follow up as needed Thank you for choosing [facility].   Please call [phone] with any questions about today's appointment.  Please be sure to schedule follow up at the front  desk before you leave today.   Cristina Albino, MD  Family Medicine

## 2022-05-09 NOTE — Progress Notes (Signed)
  SUBJECTIVE:   CHIEF COMPLAINT / HPI:   Cough x2 weeks, productive of green mucus Fever about 1 week ago for 2-3 days.  Took tylenol and nyquil/dayquil without much improvement in her symptoms, though no longer having fevers Endorses headache/sinus pain and abdominal pain with coughing  Denies vomiting, diarrhea, CP/SOB, sick contacts    OBJECTIVE:  BP 96/67   Pulse 63   Temp 98.4 F (36.9 C) (Oral)   Wt 59 kg   SpO2 100%   BMI 22.31 kg/m   General: NAD, pleasant, able to participate in exam HEENT: PERRLA, EOMI.  Patient does have mild tenderness to palpation over left maxillary sinus.  Nontender over frontal, temporal regions. Cardiac: RRR, no murmurs auscultated Respiratory: CTAB, normal WOB Abdomen: soft, non-tender, non-distended Extremities: warm and well perfused, no edema or cyanosis Skin: warm and dry, no rashes noted Neuro: alert, no obvious focal deficits, speech normal Psych: Normal affect and mood  ASSESSMENT/PLAN:   1. Acute non-recurrent maxillary sinusitis Given duration of symptoms greater than 10 days, with mild tenderness to palpation over left maxillary sinus, and reported headache/sinus pain with productive cough, suspect acute bacterial sinusitis likely a progression of prior viral URI.  Reassuringly, afebrile in clinic today with no signs of systemic infection.  Will treat with Augmentin x 7 days, reviewed supportive care and return precautions.  - amoxicillin-clavulanate (AUGMENTIN) 500-125 MG tablet; Take 1 tablet by mouth 3 (three) times daily for 7 days.  Dispense: 21 tablet; Refill: 0  Meds ordered this encounter  Medications   amoxicillin-clavulanate (AUGMENTIN) 500-125 MG tablet    Sig: Take 1 tablet by mouth 3 (three) times daily for 7 days.    Dispense:  21 tablet    Refill:  0   Return if symptoms worsen or fail to improve.  Cristina Albino, MD [facility]

## 2022-05-12 ENCOUNTER — Telehealth: Payer: Self-pay

## 2022-05-12 NOTE — Telephone Encounter (Signed)
Patient's daughter calls nurse line regarding continued cough. Patient was seen with Dr. Joeseph Amor on Tuesday.   Patient has been taking Halls cough drops, Delsym and Mucinex. Daughter reports continued dry cough. She is requesting cough medication due to persistence.   She denies fever.   Will forward to Dr. Joeseph Amor for further advisement.   Talbot Grumbling, RN

## 2022-06-29 ENCOUNTER — Telehealth: Payer: Self-pay | Admitting: *Deleted

## 2022-06-29 NOTE — Telephone Encounter (Signed)
LVM for pt to call office to see if we could change her appointment to a different day due to a scheduling problem. Used pacific interpreter [phone]

## 2022-06-30 ENCOUNTER — Ambulatory Visit (INDEPENDENT_AMBULATORY_CARE_PROVIDER_SITE_OTHER): Payer: 59 | Admitting: Family Medicine

## 2022-06-30 ENCOUNTER — Encounter: Payer: Self-pay | Admitting: Family Medicine

## 2022-06-30 VITALS — BP 96/62 | HR 58 | Temp 98.0°F | Ht 64.0 in | Wt 126.0 lb

## 2022-06-30 DIAGNOSIS — K219 Gastro-esophageal reflux disease without esophagitis: Secondary | ICD-10-CM

## 2022-06-30 DIAGNOSIS — N946 Dysmenorrhea, unspecified: Secondary | ICD-10-CM | POA: Diagnosis not present

## 2022-06-30 DIAGNOSIS — R1030 Lower abdominal pain, unspecified: Secondary | ICD-10-CM | POA: Diagnosis not present

## 2022-06-30 DIAGNOSIS — Z7689 Persons encountering health services in other specified circumstances: Secondary | ICD-10-CM | POA: Diagnosis not present

## 2022-06-30 DIAGNOSIS — Z1239 Encounter for other screening for malignant neoplasm of breast: Secondary | ICD-10-CM

## 2022-06-30 DIAGNOSIS — Z1211 Encounter for screening for malignant neoplasm of colon: Secondary | ICD-10-CM

## 2022-06-30 MED ORDER — OMEPRAZOLE 20 MG PO CPDR: 20.0000 mg | DELAYED_RELEASE_CAPSULE | Freq: Two times a day (BID) | ORAL | 1 refills | Status: DC

## 2022-06-30 NOTE — Patient Instructions (Signed)
It was nice to meet you today,   We will see you back in 2 months.  I will send in a referral for mammogram and colonoscopy.  Someone will call you about the colonoscopy.  You can just go to the breast imaging center for your mammogram.    Frederic Jericho, MD

## 2022-06-30 NOTE — Progress Notes (Unsigned)
New Patient Office Visit  Subjective    Patient ID: Cristina Boyd, female    DOB: [birth year]  Age: 52 y.o. MRN: [id number]  CC: No chief complaint on file.   HPI Quintavia Morlock presents to establish care Had to switch due to health insurance.   GERd.  Lower abdominal pain. . Both sides.  Not painful anymore.   Had period on April 1st.  Menopause? August, October, April.  Lasts for two weeks.  Pain weak and having pain.  Genella Rife.  Lasted two weeks and stopped.  Last year had chills/fever, no sweat.  3 days ago she had pain a lot.  Pain stopped yesterday.  At the residency clinic for gyn issues .  Pain not associated with food or bm.  Sometimes upper pain with eating.  No pain with urinate.    Ultrasound in [place] in November - a cyst? Ovarian?   Colonosocopy - mammogram colonsocopy,   Taking omeprazole.  Nw otc.    Cvervical spinal pain.  Methocarbamol.  Hsan't taken it in a long time.    Physical-fatty liver  PMH: ***  PSH: ***  FH: ***  Tobacco use: no  Alcohol use: no Drug use: no Marital status: married.  4 children.  24 is the youngest.   Employment: nail shop.   Sexual hx: ***  Screenings:  Colon Cancer: *** Lung Cancer: *** Breast Cancer: *** Diabetes: *** HLD: ***   Outpatient Encounter Medications as of 06/30/2022  Medication Sig   gabapentin (NEURONTIN) 100 MG capsule Take 1 capsule (100 mg total) by mouth 3 (three) times daily as needed.   meloxicam (MOBIC) 7.5 MG tablet TAKE 1 TABLET(7.5 MG) BY MOUTH DAILY   methocarbamol (ROBAXIN) 500 MG tablet Take 1 tablet (500 mg total) by mouth 4 (four) times daily.   omeprazole (PRILOSEC) 20 MG capsule Take 1 capsule (20 mg total) by mouth 2 (two) times daily before a meal.   VITAMIN D PO Take by mouth.   Facility-Administered Encounter Medications as of 06/30/2022  Medication   0.9 %  sodium chloride infusion    Past Medical History:  Diagnosis Date   Allergy    [employer]reesia 03/07/2020   Fibroadenoma    GERD  (gastroesophageal reflux disease)     No past surgical history on file.  Family History  Problem Relation Age of Onset   Hypertension Mother    Stroke Mother 27       mild CVA/TIA   Liver cancer Maternal Grandfather     Social History   Socioeconomic History   Marital status: Married    Spouse name: Not on file   Number of children: Not on file   Years of education: Not on file   Highest education level: Not on file  Occupational History   Not on file  Tobacco Use   Smoking status: Never   Smokeless tobacco: Never  Vaping Use   Vaping Use: Never used  Substance and Sexual Activity   Alcohol use: No   Drug use: No   Sexual activity: Yes    Birth control/protection: Condom    Comment: 1st intercourse 84 yo,1 partner  Other Topics Concern   Not on file  Social History Narrative   Marital status: married x 25 years   From [place]      Children: 4 children (23, 22, 20, 17); no grandchildren      Lives: with husband, 2 children; 2 in college      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  Physical Exam  {Labs (Optional):23779}    Assessment & Plan:   Problem List Items Addressed This Visit   None   No follow-ups on file.   Sandre Kitty, MD

## 2022-07-03 DIAGNOSIS — Z7689 Persons encountering health services in other specified circumstances: Secondary | ICD-10-CM | POA: Insufficient documentation

## 2022-07-03 NOTE — Assessment & Plan Note (Signed)
Irregular menstrual cycles with increasing duration of menstrual bleeding.  Likely due to being perimenopausal.  Can discuss further with patient at next visit.

## 2022-07-03 NOTE — Assessment & Plan Note (Signed)
Overall healthy-appearing female.  Major complaints are abdominal pain, dysmenorrhea - Referral for gastroenterology for colonoscopy - Referral for mammogram placed.  Patient advised to go to breast imaging center - Patient to schedule with [place] for Pap smear - Follow-up in 2 months

## 2022-07-03 NOTE — Assessment & Plan Note (Signed)
Symptoms appear to be chronic and episodic.  Given the patient's report of her ultrasound findings in [place], would favor ovarian cyst is most likely cause worsened by going through menopause..  Patient is getting a colonoscopy (screening) which will help rule out some GI causes.  If patient pain returns would consider getting pelvic ultrasound to confirm her findings in [place].

## 2022-07-06 ENCOUNTER — Ambulatory Visit
Admission: RE | Admit: 2022-07-06 | Discharge: 2022-07-06 | Disposition: A | Discharge status: Home / Self Care | Payer: 59 | Source: Ambulatory Visit | Attending: Family Medicine | Admitting: Family Medicine

## 2022-07-06 DIAGNOSIS — Z1239 Encounter for other screening for malignant neoplasm of breast: Secondary | ICD-10-CM

## 2022-07-06 NOTE — Telephone Encounter (Signed)
Pt did not change appointment and was seen on the original day.

## 2022-07-10 ENCOUNTER — Encounter: Payer: Self-pay | Admitting: Gastroenterology

## 2022-07-26 ENCOUNTER — Ambulatory Visit (AMBULATORY_SURGERY_CENTER): Payer: 59

## 2022-07-26 VITALS — Ht 64.0 in | Wt 126.0 lb

## 2022-07-26 DIAGNOSIS — Z1211 Encounter for screening for malignant neoplasm of colon: Secondary | ICD-10-CM

## 2022-07-26 MED ORDER — NA SULFATE-K SULFATE-MG SULF 17.5-3.13-1.6 GM/177ML PO SOLN: 1.0000 | Freq: Once | ORAL | 0 refills | Status: AC

## 2022-07-26 NOTE — Progress Notes (Signed)
[facility] Pre-Visit Note  Pre visit Date 07/26/22     Mulan Yarwood    [id number]    [birth year]  Primary Care Physician:Olson, Mabeline Caras, MD  Referring Physician: Sandre Kitty, MD [address] [place],  [place] [address]  Pre visit In Person Direct   Last Colonoscopy:none  Last office visit: 10/31/17  Procedure: Colonoscopy Date/Time of procedure: 08/22/22 @3 :30pm Procedure MD: Nandigam Procedure Diagnosis Code: z12.11  Blood thinners: Yes/No no  Bowel Prep: suprep  Constipation: Yes/No no  Insurance: [employer] Pharmacy: [facility]  Patient instructed to use Singlecare.com or GoodRx for a price reduction on prep  There were no vitals filed for this visit.  Body mass index is 21.63 kg/m.  Height: Weight:  Last Weight  Most recent update: 07/26/2022 12:39 PM    Weight  57.2 kg (126 lb)              No egg or soy allergy known to patient No issues known to patient with past sedation with any surgeries or procedures No history of difficulty with intubation No FH of Malignant Hyperthermia No weight loss medications or diet pills Patient is not on home 02 No history of A fib or A flutter  ECHO/ EF: no AICD/Pacemaker: no  Any cardiac testing pending: no  Allergies as of 07/26/2022   (No Known Allergies)     MEDICATIONs Instructed to HOLD:no   Outpatient Encounter Medications as of 07/26/2022  Medication Sig   Na Sulfate-K Sulfate-Mg Sulf 17.5-3.13-1.6 GM/177ML SOLN Take 1 kit by mouth once for 1 dose. May use generic Suprep, no Prior Authorization; Use Singlecare or Good RX.   omeprazole (PRILOSEC) 20 MG capsule Take 1 capsule (20 mg total) by mouth 2 (two) times daily before a meal.   VITAMIN D PO Take by mouth.   Facility-Administered Encounter Medications as of 07/26/2022  Medication   0.9 %  sodium chloride infusion     Past Medical History:  Diagnosis Date   Allergy    Phreesia 03/07/2020   Anxiety    Arthritis     Fibroadenoma    GERD (gastroesophageal reflux disease)     History reviewed. No pertinent surgical history.  Family History  Problem Relation Age of Onset   Hypertension Mother    Stroke Mother 81       mild CVA/TIA   Liver cancer Maternal Grandfather    Colon cancer Neg Hx    Colon polyps Neg Hx    Esophageal cancer Neg Hx    Rectal cancer Neg Hx    Stomach cancer Neg Hx      Fall Risk:     01/07/2020    2:49 PM 03/10/2020    8:45 AM 04/20/2020    2:44 PM 09/08/2021    8:07 AM 06/30/2022    9:29 AM  Fall Risk  Falls in the past year? 0 0 0  0  Was there an injury with Fall? 0 0 0  0  Fall Risk Category Calculator 0 0 0  0  Fall Risk Category (Retired) Low Low Low    (RETIRED) Patient Fall Risk Level  Low fall risk  Low fall risk   Patient at Risk for Falls Due to     No Fall Risks  Fall risk Follow up Falls evaluation completed Falls evaluation completed Falls evaluation completed  Falls evaluation completed     Patient's chart reviewed by Cathlyn Parsons CNRA prior to previsit and patient is appropriate to undergo  procedure in [facility].  Previsit completed and red dot placed by patient's name on their procedure day (on provider's schedule).      To be filled in admitting on day of procedure:  BP:               HR:                O2 Sat:               Temp:                       Blood glucose:  LMP:  Post menopause/IUD  Pregnancy Test:  Blood Thinners:  Last Dose:   No egg or soy allergy known to patient  No issues known to pt with past sedation with any surgeries or procedures Patient denies ever being told they had issues or difficulty with intubation  No FH of Malignant Hyperthermia Pt is not on diet pills Pt is not on  home 02  Pt is not on blood thinners  Pt denies issues with constipation  No A fib or A flutter Have any cardiac testing pending--no Pt instructed to use Singlecare.com or GoodRx for a price reduction on prep  Patient's chart reviewed by Cathlyn Parsons CNRA prior to previsit and patient appropriate for the [facility].  Previsit completed and red dot placed by patient's name on their procedure day (on provider's schedule).

## 2022-08-09 ENCOUNTER — Encounter: Payer: Self-pay | Admitting: Gastroenterology

## 2022-08-22 ENCOUNTER — Encounter: Payer: 59 | Admitting: Gastroenterology

## 2022-08-30 ENCOUNTER — Other Ambulatory Visit (HOSPITAL_COMMUNITY)
Admission: RE | Admit: 2022-08-30 | Discharge: 2022-08-30 | Disposition: A | Discharge status: Home / Self Care | Payer: 59 | Source: Ambulatory Visit | Attending: Family Medicine | Admitting: Family Medicine

## 2022-08-30 ENCOUNTER — Ambulatory Visit: Payer: 59 | Admitting: Family Medicine

## 2022-08-30 ENCOUNTER — Encounter: Payer: Self-pay | Admitting: Family Medicine

## 2022-08-30 VITALS — BP 94/63 | HR 62 | Temp 98.3°F | Ht 64.0 in | Wt 129.0 lb

## 2022-08-30 DIAGNOSIS — R1030 Lower abdominal pain, unspecified: Secondary | ICD-10-CM

## 2022-08-30 DIAGNOSIS — E782 Mixed hyperlipidemia: Secondary | ICD-10-CM | POA: Diagnosis not present

## 2022-08-30 DIAGNOSIS — E559 Vitamin D deficiency, unspecified: Secondary | ICD-10-CM

## 2022-08-30 DIAGNOSIS — N760 Acute vaginitis: Secondary | ICD-10-CM

## 2022-08-30 DIAGNOSIS — R7303 Prediabetes: Secondary | ICD-10-CM

## 2022-08-30 DIAGNOSIS — Z124 Encounter for screening for malignant neoplasm of cervix: Secondary | ICD-10-CM

## 2022-08-30 LAB — POCT GLYCOSYLATED HEMOGLOBIN (HGB A1C): Hemoglobin A1C: 6.4 % — AB (ref 4.0–5.6)

## 2022-08-30 NOTE — Assessment & Plan Note (Signed)
Increased to 6.4 today.  Counseled patient on dietary changes.  Advised patient to reduce rice intake which she eats multiple times a day. - Follow-up A1c and cholesterol in 3 months.

## 2022-08-30 NOTE — Assessment & Plan Note (Signed)
Pain recurred since last visit.  No adnexal or cervical tenderness on exam.  Ultrasound in 2021 showed 2 small leiomyoma - Pelvic ultrasound ordered - Colonoscopy scheduled for next month - Consider gynecology referral pending results of the above - Follow-up Pap testing.

## 2022-08-30 NOTE — Progress Notes (Signed)
   Established Patient Office Visit  Subjective   Patient ID: Cristina Boyd, female    DOB: December 15, 1970  Age: 52 y.o. MRN: [id number]  Chief Complaint  Patient presents with   Follow-up    Physical and pap    HPI  Irregular periods -patient still having irregular periods last period was June 3 and last for 3 days.  Prior to that it was April.  Abdominal pain-patient's abdominal pain returned.  His bilateral lower abdominal pain.  Occurs during the day but feels worse at night.  Not worse with eating.  No nausea or vomiting.  Discussed her ultrasound results from 2021.  Patient has colonoscopy scheduled for next month.  Patient's point-of-care A1c was 6.4.  Discussed how this is at the verge of becoming diabetic range.  Discussed foods that cause increase in blood sugar and patient endorses eating rice daily with multiple meals.  Discussed that rice is virtually all carbohydrate and recommended she either by half her rice consumption with each meal versus eating rice only with 1 meal per day.  Patient's eats mostly [place] foods that involve a lot of rice and eliminating rice totally would be very difficult   The 10-year ASCVD risk score (Arnett DK, et al., 2019) is: 0.8%   {History (Optional):23778}  ROS    Objective:     BP 94/63   Pulse 62   Temp 98.3 F (36.8 C) (Oral)   Ht 5\' 4"  (1.626 m)   Wt 129 lb (58.5 kg)   SpO2 97%   BMI 22.14 kg/m  {Vitals History (Optional):23777}  Physical Exam General: Alert and oriented.  Companied by daughter CV: Regular rate and rhythm Pulmonary: Lungs clear bilaterally GU: Normal-appearing labia and cervix.  Minimal amount of whitish discharge from cervix.   Results for orders placed or performed in visit on 08/30/22  POCT HgB A1C  Result Value Ref Range   Hemoglobin A1C 6.4 (A) 4.0 - 5.6 %   HbA1c POC (<> result, manual entry)     HbA1c, POC (prediabetic range)     HbA1c, POC (controlled diabetic range)      {Labs  (Optional):23779}      Assessment & Plan:   Lower abdominal pain Assessment & Plan: Pain recurred since last visit.  No adnexal or cervical tenderness on exam.  Ultrasound in 2021 showed 2 small leiomyoma - Pelvic ultrasound ordered - Colonoscopy scheduled for next month - Consider gynecology referral pending results of the above - Follow-up Pap testing.  Orders: -     US PELVIC COMPLETE WITH TRANSVAGINAL; Future  Pre-diabetes -     POCT glycosylated hemoglobin (Hb A1C)  Mixed hyperlipidemia -     Lipid panel  Serum calcium elevated -     Comprehensive metabolic panel  Vitamin D deficiency -     VITAMIN D 25 Hydroxy (Vit-D Deficiency, Fractures)  Cervical cancer screening -     Cytology - PAP  Acute vaginitis -     NuSwab BV and Candida, NAA  Prediabetes Assessment & Plan: Increased to 6.4 today.  Counseled patient on dietary changes.  Advised patient to reduce rice intake which she eats multiple times a day. - Follow-up A1c and cholesterol in 3 months.      Return in about 3 months (around 11/30/2022) for abdominal pain, prediabetes.    Sandre Kitty, MD

## 2022-08-30 NOTE — Patient Instructions (Addendum)
It was nice to see you today,  We addressed the following topics today: - we will let you know the results of the lab tests when we get them.  - we will send in a referral for a ultrasound.  Someone will call you to schedule it.  - based on these results and the colonoscopy we will then likely refer you to a gynecologist.   Have a great day,  Frederic Jericho, MD

## 2022-08-31 LAB — COMPREHENSIVE METABOLIC PANEL
ALT: 21 IU/L (ref 0–32)
AST: 19 IU/L (ref 0–40)
Albumin: 4.3 g/dL (ref 3.8–4.9)
Alkaline Phosphatase: 70 IU/L (ref 44–121)
BUN/Creatinine Ratio: 25 — ABNORMAL HIGH (ref 9–23)
BUN: 14 mg/dL (ref 6–24)
Bilirubin Total: 0.4 mg/dL (ref 0.0–1.2)
CO2: 25 mmol/L (ref 20–29)
Calcium: 9.7 mg/dL (ref 8.7–10.2)
Chloride: 105 mmol/L (ref 96–106)
Creatinine, Ser: 0.55 mg/dL — ABNORMAL LOW (ref 0.57–1.00)
Globulin, Total: 2.9 g/dL (ref 1.5–4.5)
Glucose: 97 mg/dL (ref 70–99)
Potassium: 4.1 mmol/L (ref 3.5–5.2)
Sodium: 143 mmol/L (ref 134–144)
Total Protein: 7.2 g/dL (ref 6.0–8.5)
eGFR: 111 mL/min/{1.73_m2} (ref >59)

## 2022-08-31 LAB — LIPID PANEL
Chol/HDL Ratio: 3.3 ratio (ref 0.0–4.4)
Cholesterol, Total: 221 mg/dL — ABNORMAL HIGH (ref 100–199)
HDL: 66 mg/dL (ref >39)
LDL Chol Calc (NIH): 129 mg/dL — ABNORMAL HIGH (ref 0–99)
Triglycerides: 147 mg/dL (ref 0–149)
VLDL Cholesterol Cal: 26 mg/dL (ref 5–40)

## 2022-08-31 LAB — VITAMIN D 25 HYDROXY (VIT D DEFICIENCY, FRACTURES): Vit D, 25-Hydroxy: 21.4 ng/mL — ABNORMAL LOW (ref 30.0–100.0)

## 2022-09-01 LAB — NUSWAB BV AND CANDIDA, NAA
Candida albicans, NAA: NEGATIVE
Candida glabrata, NAA: NEGATIVE

## 2022-09-04 LAB — CYTOLOGY - PAP
Comment: NEGATIVE
Diagnosis: NEGATIVE
High risk HPV: NEGATIVE

## 2022-09-11 ENCOUNTER — Ambulatory Visit (AMBULATORY_SURGERY_CENTER): Payer: 59 | Admitting: Gastroenterology

## 2022-09-11 ENCOUNTER — Encounter: Payer: Self-pay | Admitting: Gastroenterology

## 2022-09-11 VITALS — BP 105/64 | HR 64 | Temp 97.8°F | Resp 13 | Ht 64.0 in | Wt 126.0 lb

## 2022-09-11 DIAGNOSIS — D123 Benign neoplasm of transverse colon: Secondary | ICD-10-CM

## 2022-09-11 DIAGNOSIS — Z1211 Encounter for screening for malignant neoplasm of colon: Secondary | ICD-10-CM

## 2022-09-11 MED ORDER — SODIUM CHLORIDE 0.9 % IV SOLN: 500.0000 mL | Freq: Once | INTRAVENOUS | Status: DC

## 2022-09-11 NOTE — Patient Instructions (Signed)
Handouts provided on polyps, diverticulosis and hemorrhoids.  Resume previous diet.  Continue present medications.  Await pathology results.  Repeat colonoscopy in 5-10 years surveillance based on pathology results.  Return to GI clinic at the next available appointment for evaluation of epigastric pain and GERD.   HM NAY QU V? ? TH?C HI?N TH? THU?T N?I SOI T?I TRUNG TM N?I SOI [place]: Vui lng xem bo co th? thu?t ? ???c g?i cho qu v?, n?u qu v? c b?t k? th?c m?c g trong su?t qu trnh th?m khm. N?u bo co th? thu?t khng th? gi?i ?p th?c m?c c?a qu v?, vui lng g?i cho bc s? chuyn khoa tiu ha c?a qu v? ?? ???c gi?i ?p. N?u qu v? ? yu c?u khng cung c?p thng tin chi ti?t v? k?t qu? th? thu?t cho ??i tc ch?m [place] c?a qu v?, th bo co th? thu?t s? ???c g?i trong m?t phong b ???c dn kn ?? qu v? xem khi thu?n ti?n.   QU V? C TH?: C?m gic ch??ng b?ng. Trung ti?n nhi?u h?n bnh th??ng. ?i b? c th? gip ??y ra ngoi khng kh ?i vo ???ng tiu ha trong khi th?c hi?n th? thu?t v gi?m ch??ng b?ng. N?u qu v? ti?n hnh n?i soi d??i (nh? n?i soi ??i trng ho?c soi k?t trng xch-ma b?ng ?ng m?m), qu v? c th? th?y cc ch?m mu ? phn ho?c trn gi?y v? sinh. N?u qu v? ? lm s?ch ??i trng ?? th?c hi?n th? thu?t, qu v? c th? khng ?i ??i ti?n nh? bnh th??ng trong vi ngy.  Vui Lng L?u : Qu v? c th? b? kch ?ng v ngh?t m?i ho?c ch?y n??c m?i. Tnh tr?ng ny l do ?nh h??ng c?a vi?c th? bnh oxy trong qu trnh th?c hi?n th? thu?t. Qu v? khng c?n lo l?ng, tnh tr?ng ny s? bi?n m?t sau m?t ho?c vi ngy.   CC TRI?U CH?NG C?N BO CO NGAY  Sau khi th?c hi?n n?i soi d??i (n?i soi ??i trng ho?c soi k?t trng xch-ma b?ng ?ng m?m): Phn c nhi?u mu ?au b?ng d? d?i ho?c ngy cng t?ng Xu?t hi?n v?t s?ng b?ng m?i, c?p tnh S?t t? 100F tr? ln   Sau khi th?c hi?n n?i soi trn (EGD)  Nn ra mu ho?c ch?t mu c ph s?m Xu?t hi?n c?n ?au ng?c ho?c ?au d??i x??ng b?  vai m?i Nu?t ?au ho?c kh nu?t M?i b? kh th? S?t t? 100F tr? ln Phn ?en nh? m?c  ??i v?i cc v?n ?? kh?n c?p ho?c c?p c?u, qu v? c th? lin h? bc s? chuyn khoa tiu ha b?t k? lc no b?ng cch g?i ??n s? [phone].   CH? ?? ?N U?NG: Chng ti [employer] v? tr??c tin nn ?n nh?, nh?ng sau ? qu v? c th? ?n theo ch? ?? bnh th??ng. U?ng nhi?u n??c nh?ng ph?i trnh ?? u?ng c c?n trong 24 gi?.  HO?T ??NG: Qu v? c?n ln k? ho?ch ?? ngh? ng?i trong ngy hm nay v KHNG NN LI XE ho?c s? d?ng my mc n?ng cho ??n ngy mai (do tc d?ng c?a thu?c an th?n s? d?ng trong th? thu?t).   THEO DI: Nhn vin c?a chng ti s? g?i cho qu v? theo s? ?i?n tho?i trong b?nh n vo ngy lm vi?c ti?p theo sau ngy th?c hi?n th? thu?t ?? ki?m tra tnh tr?ng c?a qu v? v gi?i ?p  cc cu h?i ho?c th?c m?c c?a qu v? v? thng tin m qu v? ???c cung c?p sau khi th?c hi?n th? thu?t. N?u chng ti khng lin l?c ???c v?i qu v?, chng ti s? ?? l?i tin nh?n. Tuy nhin, n?u qu v? c?m th?y kh?e v khng g?p b?t k? s? c? no, qu v? khng c?n g?i l?i cho chng ti. Chng ti s? gi? ??nh r?ng qu v? ? tr? l?i sinh ho?t bnh th??ng v khng g?p b?t k? s? c? no.  N?u qu v? ???c l?y sinh thi?t, chng ti s? lin l?c v?i qu v? qua ?i?n tho?i ho?c th? trong 1-3 tu?n ti?p theo. Vui lng g?i cho chng ti theo s? [phone] n?u qu v? khng nh?n ???c thng tin v? k?t qu? sinh thi?t trong 3 tu?n.  CH? K/B?O M?TL[place]ell [place]ads v? v/ho?c ??i tc ch?m [place] c?a qu v? ? k vo cc ti li?u s? ???c nh?p vo h? s? y t? ?i?n t? c?a qu v?. Ch? k ny xc nh?n r?ng cc thng tin trn ?y trong B?n Tm T?t Sau Khi Th?m Khm c?a qu v? ? ???c xem xt v hi?u r. Qu v? v/ho?c       YOU HAD AN ENDOSCOPIC PROCEDURE TODAY AT THE [facility]:   Refer to the procedure report that was given to you for any specific questions about what was found during the examination.  If the procedure report does not answer your  questions, please call your gastroenterologist to clarify.  If you requested that your care partner not be given the details of your procedure findings, then the procedure report has been included in a sealed envelope for you to review at your convenience later.  YOU SHOULD EXPECT: Some feelings of bloating in the abdomen. Passage of more gas than usual.  Walking can help get rid of the air that was put into your GI tract during the procedure and reduce the bloating. If you had a lower endoscopy (such as a colonoscopy or flexible sigmoidoscopy) you may notice spotting of blood in your stool or on the toilet paper. If you underwent a bowel prep for your procedure, you may not have a normal bowel movement for a few days.  Please Note:  You might notice some irritation and congestion in your nose or some drainage.  This is from the oxygen used during your procedure.  There is no need for concern and it should clear up in a day or so.  SYMPTOMS TO REPORT IMMEDIATELY:  Following lower endoscopy (colonoscopy or flexible sigmoidoscopy):  Excessive amounts of blood in the stool  Significant tenderness or worsening of abdominal pains  Swelling of the abdomen that is new, acute  Fever of 100F or higher  For urgent or emergent issues, a gastroenterologist can be reached at any hour by calling [phone]. Do not use MyChart messaging for urgent concerns.    DIET:  We do recommend a small meal at first, but then you may proceed to your regular diet.  Drink plenty of fluids but you should avoid alcoholic beverages for 24 hours.  ACTIVITY:  You should plan to take it easy for the rest of today and you should NOT DRIVE or use heavy machinery until tomorrow (because of the sedation medicines used during the test).    FOLLOW UP: Our staff will call the number listed on your records the next business day following your procedure.  We will call around 7:15-  8:00 am to check on you and address any questions  or concerns that you may have regarding the information given to you following your procedure. If we do not reach you, we will leave a message.     If any biopsies were taken you will be contacted by phone or by letter within the next 1-3 weeks.  Please call us at [phone] if you have not heard about the biopsies in 3 weeks.    SIGNATURES/CONFIDENTIALITY: You and/or your care partner have signed paperwork which will be entered into your electronic medical record.  These signatures attest to the fact that that the information above on your After Visit Summary has been reviewed and is understood.  Full responsibility of the confidentiality of this discharge information lies with you and/or your care-partner.

## 2022-09-11 NOTE — Progress Notes (Unsigned)
Chasin Findling CRNA relieves  Cabin crew

## 2022-09-11 NOTE — Progress Notes (Unsigned)
[facility] History and Physical   Primary Care Physician:  Sandre Kitty, MD   Reason for Procedure:  Colorectal cancer screening  Plan:    Screening colonoscopy with possible interventions as needed     HPI: Cristina Boyd is a very pleasant 52 y.o. female here for screening colonoscopy. Denies any nausea, vomiting, abdominal pain, melena or bright red blood per rectum  The risks and benefits as well as alternatives of endoscopic procedure(s) have been discussed and reviewed. All questions answered. The patient agrees to proceed.    Past Medical History:  Diagnosis Date   Allergy    Phreesia 03/07/2020   Anxiety    Arthritis    Fibroadenoma    GERD (gastroesophageal reflux disease)     History reviewed. No pertinent surgical history.  Prior to Admission medications   Medication Sig Start Date End Date Taking? Authorizing Provider  Multiple Vitamin (MULTIVITAMIN) tablet Take 1 tablet by mouth daily.    [provider]  omeprazole (PRILOSEC) 20 MG capsule Take 1 capsule (20 mg total) by mouth 2 (two) times daily before a meal. 06/30/22   Sandre Kitty, MD  VITAMIN D PO Take by mouth. Patient not taking: Reported on 08/30/2022    [provider]    Current Outpatient Medications  Medication Sig Dispense Refill   Multiple Vitamin (MULTIVITAMIN) tablet Take 1 tablet by mouth daily.     omeprazole (PRILOSEC) 20 MG capsule Take 1 capsule (20 mg total) by mouth 2 (two) times daily before a meal. 60 capsule 1   VITAMIN D PO Take by mouth. (Patient not taking: Reported on 08/30/2022)     Current Facility-Administered Medications  Medication Dose Route Frequency Provider Last Rate Last Admin   0.9 %  sodium chloride infusion  500 mL Intravenous Once Felipa Laroche V, MD       0.9 %  sodium chloride infusion  500 mL Intravenous Once Napoleon Form, MD        Allergies as of 09/11/2022   (No Known Allergies)    Family History  Problem  Relation Age of Onset   Hypertension Mother    Stroke Mother 37       mild CVA/TIA   Liver cancer Maternal Grandfather    Colon cancer Neg Hx    Colon polyps Neg Hx    Esophageal cancer Neg Hx    Rectal cancer Neg Hx    Stomach cancer Neg Hx     Social History   Socioeconomic History   Marital status: Married    Spouse name: Not on file   Number of children: Not on file   Years of education: Not on file   Highest education level: Not on file  Occupational History   Not on file  Tobacco Use   Smoking status: Never   Smokeless tobacco: Never  Vaping Use   Vaping Use: Never used  Substance and Sexual Activity   Alcohol use: No   Drug use: No   Sexual activity: Yes    Birth control/protection: Condom    Comment: 1st intercourse 52 yo,1 partner  Other Topics Concern   Not on file  Social History Narrative   Marital status: married x 25 years   From [place]      Children: 4 children (23, 22, 20, 17); no grandchildren      Lives: with husband, 2 children; 2 in college      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  Physical Exam: Vital signs in last 24 hours: Blood Pressure (Abnormal) 98/55   Pulse 61   Temperature 97.8 F (36.6 C)   Height 5\' 4"  (1.626 m)   Weight 126 lb (57.2 kg)   Last Menstrual Period 09/04/2022   Oxygen Saturation 99%   Body Mass Index 21.63 kg/m  General:   Alert, NAD Lungs:  Clear .   Heart:  Regular rate and rhythm Abdomen:  Soft, nontender and  nondistended. Neuro/Psych:  Alert and cooperative. Normal mood and affect. A and O x 3  Reviewed labs, radiology imaging, old records and pertinent past GI work up  Patient is appropriate for planned procedure(s) and anesthesia in an ambulatory setting   K. Scherry Ran , MD [phone]

## 2022-09-11 NOTE — Progress Notes (Unsigned)
Called to room to assist during endoscopic procedure.  Patient ID and intended procedure confirmed with present staff. Received instructions for my participation in the procedure from the performing physician.  

## 2022-09-11 NOTE — Progress Notes (Unsigned)
Sedate, gd SR, tolerated procedure well, VSS, report to RN 

## 2022-09-11 NOTE — Op Note (Signed)
[facility] Patient Name: Cristina Boyd Procedure Date: 09/11/2022 4:08 PM MRN: [id number] Endoscopist: Napoleon Form , MD, [id number] Age: 52 Referring MD:  Date of Birth: 1970/11/14 Gender: Female Account #: [id number] Procedure:                Colonoscopy Indications:              Screening for colorectal malignant neoplasm Medicines:                Monitored Anesthesia Care Procedure:                Pre-Anesthesia Assessment:                           - Prior to the procedure, a History and Physical                            was performed, and patient medications and                            allergies were reviewed. The patient's tolerance of                            previous anesthesia was also reviewed. The risks                            and benefits of the procedure and the sedation                            options and risks were discussed with the patient.                            All questions were answered, and informed consent                            was obtained. Prior Anticoagulants: The patient has                            taken no anticoagulant or antiplatelet agents. ASA                            Grade Assessment: II - A patient with mild systemic                            disease. After reviewing the risks and benefits,                            the patient was deemed in satisfactory condition to                            undergo the procedure.                           After obtaining informed consent, the colonoscope  was passed under direct vision. Throughout the                            procedure, the patient's blood pressure, pulse, and                            oxygen saturations were monitored continuously. The                            [id number]-[id number] Colonoscope [id number] was introduced                            through the anus and advanced to the the cecum,                            identified by  appendiceal orifice and ileocecal                            valve. The colonoscopy was performed without                            difficulty. The patient tolerated the procedure                            well. The quality of the bowel preparation was                            good. The ileocecal valve, appendiceal orifice, and                            rectum were photographed. Scope In: 4:13:46 PM Scope Out: 4:25:56 PM Scope Withdrawal Time: 0 hours 9 minutes 9 seconds  Total Procedure Duration: 0 hours 12 minutes 10 seconds  Findings:                 The perianal and digital rectal examinations were                            normal.                           A 4 mm polyp was found in the transverse colon. The                            polyp was sessile. The polyp was removed with a                            cold snare. Resection and retrieval were complete.                           A few small-mouthed diverticula were found in the                            sigmoid colon and descending colon.  Non-bleeding external and internal hemorrhoids were                            found during retroflexion. The hemorrhoids were                            medium-sized.                           Anal papilla(e) were hypertrophied. Complications:            No immediate complications. Estimated Blood Loss:     Estimated blood loss was minimal. Impression:               - One 4 mm polyp in the transverse colon, removed                            with a cold snare. Resected and retrieved.                           - Diverticulosis in the sigmoid colon and in the                            descending colon.                           - Non-bleeding external and internal hemorrhoids.                           - Anal papilla(e) were hypertrophied. Recommendation:           - Patient has a contact number available for                            emergencies. The signs  and symptoms of potential                            delayed complications were discussed with the                            patient. Return to normal activities tomorrow.                            Written discharge instructions were provided to the                            patient.                           - Resume previous diet.                           - Continue present medications.                           - Await pathology results.                           -  Repeat colonoscopy in 5-10 years for surveillance                            based on pathology results.                           - Return to GI clinic at the next available                            appointment for evaluation of epigastric pain and                            GERD. Napoleon Form, MD 09/11/2022 4:32:46 PM This report has been signed electronically.

## 2022-09-14 ENCOUNTER — Encounter: Payer: Self-pay | Admitting: Gastroenterology

## 2022-12-05 ENCOUNTER — Ambulatory Visit: Payer: 59 | Admitting: Family Medicine

## 2022-12-05 ENCOUNTER — Encounter: Payer: Self-pay | Admitting: Family Medicine

## 2022-12-05 VITALS — BP 94/62 | HR 54 | Ht 64.0 in | Wt 126.0 lb

## 2022-12-05 DIAGNOSIS — N951 Menopausal and female climacteric states: Secondary | ICD-10-CM | POA: Insufficient documentation

## 2022-12-05 DIAGNOSIS — R1030 Lower abdominal pain, unspecified: Secondary | ICD-10-CM | POA: Diagnosis not present

## 2022-12-05 DIAGNOSIS — R7303 Prediabetes: Secondary | ICD-10-CM

## 2022-12-05 LAB — POCT GLYCOSYLATED HEMOGLOBIN (HGB A1C): HbA1c POC (<> result, manual entry): 5.9 % (ref 4.0–5.6)

## 2022-12-05 MED ORDER — CITALOPRAM HYDROBROMIDE 10 MG PO TABS: 10.0000 mg | ORAL_TABLET | Freq: Every day | ORAL | 3 refills | Status: DC

## 2022-12-05 NOTE — Assessment & Plan Note (Signed)
Patient with worsening hot flashes and increased irritability.  Discussed SSRIs as the treatment option.  Patient okay with starting citalopram 10 mg.  Provided her with information and discussed side effects with this. - Citalopram 10 mg - Follow-up 1 month - Avoiding use of hormonal therapy for symptoms while we workup possible gynecologic causes of abdominal pain.

## 2022-12-05 NOTE — Patient Instructions (Signed)
It was nice to see you today,  We addressed the following topics today: -You will need to schedule the ultrasound at [facility] imaging.  April can help you on your way out. - For pain relief you can take Tylenol 1000 mg every 8 hours. - It is okay to take ibuprofen at night 400 mg.  If it upsets your stomach you can take the Prilosec before you take the ibuprofen.  Have a great day,  Frederic Jericho, MD

## 2022-12-05 NOTE — Progress Notes (Unsigned)
Established Patient Office Visit  Subjective   Patient ID: Risa Auman, female    DOB: November 09, 1970  Age: 52 y.o. MRN: [id number]  Chief Complaint  Patient presents with   Abdominal Pain    HPI  Abdominal pain-patient continues to have lower abdominal pain.  Also having right flank pain.  This is a chronic pain and typically occurs on most days.  There are a few days a month when it does not occur.  It is not associated with eating or it is associated with bowel movements.  No dysuria.  No nausea or vomiting.  No one called her regarding the ultrasound order for her transvaginal ultrasound therefore she has not had it done.  We discussed the findings of her colonoscopy.  Patient will take ibuprofen on some nights but does not like to take it often due to her reflux symptoms.  She also uses any topical treatment that she puts on it.  Pain is worse in the night but also occurs during the day.  She says she may just not notice it during the day when she is thinking about other things.  Patient also complains of hot flashes and being more irritable recently.  We discussed treatment of menopausal symptoms with medications like SSRIs.  Discussed how they are antidepressants and may help with irritability.  Patient open to trying this.  Patient's menstrual history has been more irregular recently.  Last year had periods in August, November, December, then April and has not had one since April.   The 10-year ASCVD risk score (Arnett DK, et al., 2019) is: 0.7%  Health Maintenance Due  Topic Date Due   Zoster Vaccines- Shingrix (1 of 2) Never done   INFLUENZA VACCINE  10/05/2022   COVID-19 Vaccine (2 - 2023-24 season) 11/05/2022      Objective:     BP 94/62   Pulse (!) 54   Ht 5\' 4"  (1.626 m)   Wt 126 lb (57.2 kg)   SpO2 99%   BMI 21.63 kg/m  {Vitals History (Optional):23777}  Physical Exam General: Alert, oriented CV: Regular rate and rhythm Pulmonary: Lungs clear bilaterally GI: Soft,  nontender to palpation.  Normal bowel sounds GU: No CVA tenderness MSK: Lower thoracic region nontender to palpation.   Results for orders placed or performed in visit on 12/05/22  POCT glycosylated hemoglobin (Hb A1C)  Result Value Ref Range   Hemoglobin A1C     HbA1c POC (<> result, manual entry) 5.9 4.0 - 5.6 %   HbA1c, POC (prediabetic range)     HbA1c, POC (controlled diabetic range)          Assessment & Plan:   Pre-diabetes -     POCT glycosylated hemoglobin (Hb A1C)  Lower abdominal pain Assessment & Plan: Patient complains of continued lower abdominal pain that occurs on a almost daily basis, worse at night, not associated with eating or bowel movement, no dysuria or other urinary symptoms.  Recent colonoscopy showed small diverticuli and internal and external hemorrhoids.  Transvaginal ultrasound ordered last visit has not been scheduled yet.  Patient is scheduling this with our front desk before she leaves today.  I think it is most likely her symptoms are more likely gynecological than gastrointestinal.  Workup so far has been negative.  Will await ultrasound results before referring to gynecology.  Discussed symptomatic treatment with Tylenol, topical treatments, ibuprofen as needed but NSAID use limited due to her reflux symptoms.  Discussed using Pepcid when taking  the ibuprofen.   Perimenopausal vasomotor symptoms Assessment & Plan: Patient with worsening hot flashes and increased irritability.  Discussed SSRIs as the treatment option.  Patient okay with starting citalopram 10 mg.  Provided her with information and discussed side effects with this. - Citalopram 10 mg - Follow-up 1 month - Avoiding use of hormonal therapy for symptoms while we workup possible gynecologic causes of abdominal pain.   Other orders -     Citalopram Hydrobromide; Take 1 tablet (10 mg total) by mouth daily.  Dispense: 30 tablet; Refill: 3     Return in about 4 weeks (around  01/02/2023) for abd pain, hot flashes.  Sandre Kitty, MD

## 2022-12-05 NOTE — Assessment & Plan Note (Signed)
Patient complains of continued lower abdominal pain that occurs on a almost daily basis, worse at night, not associated with eating or bowel movement, no dysuria or other urinary symptoms.  Recent colonoscopy showed small diverticuli and internal and external hemorrhoids.  Transvaginal ultrasound ordered last visit has not been scheduled yet.  Patient is scheduling this with our front desk before she leaves today.  I think it is most likely her symptoms are more likely gynecological than gastrointestinal.  Workup so far has been negative.  Will await ultrasound results before referring to gynecology.  Discussed symptomatic treatment with Tylenol, topical treatments, ibuprofen as needed but NSAID use limited due to her reflux symptoms.  Discussed using Pepcid when taking the ibuprofen.

## 2022-12-07 ENCOUNTER — Ambulatory Visit (HOSPITAL_COMMUNITY)
Admission: RE | Admit: 2022-12-07 | Discharge: 2022-12-07 | Disposition: A | Discharge status: Home / Self Care | Payer: 59 | Source: Ambulatory Visit | Attending: Family Medicine | Admitting: Family Medicine

## 2022-12-07 DIAGNOSIS — R1030 Lower abdominal pain, unspecified: Secondary | ICD-10-CM | POA: Diagnosis present

## 2022-12-13 ENCOUNTER — Encounter: Payer: Self-pay | Admitting: Family Medicine

## 2022-12-13 DIAGNOSIS — R7303 Prediabetes: Secondary | ICD-10-CM

## 2022-12-13 MED ORDER — LANCET DEVICE MISC
1.0000 | Freq: Three times a day (TID) | 0 refills | Status: AC
Start: 2022-12-13 — End: 2023-01-12

## 2022-12-13 MED ORDER — BLOOD GLUCOSE TEST VI STRP
1.0000 | ORAL_STRIP | Freq: Every day | 2 refills | Status: DC
Start: 2022-12-13 — End: 2023-05-24

## 2022-12-13 MED ORDER — BLOOD GLUCOSE MONITORING SUPPL DEVI
1.0000 | Freq: Every day | 0 refills | Status: DC
Start: 2022-12-13 — End: 2023-08-06

## 2022-12-26 ENCOUNTER — Other Ambulatory Visit: Payer: Self-pay | Admitting: Family Medicine

## 2022-12-26 DIAGNOSIS — N888 Other specified noninflammatory disorders of cervix uteri: Secondary | ICD-10-CM

## 2022-12-27 ENCOUNTER — Telehealth: Payer: Self-pay | Admitting: *Deleted

## 2022-12-27 NOTE — Telephone Encounter (Signed)
I believe pts daughter called and LVM but did not leave number only birthday and unsure of first name.

## 2023-01-03 ENCOUNTER — Telehealth: Payer: Self-pay | Admitting: *Deleted

## 2023-01-03 ENCOUNTER — Encounter: Payer: Self-pay | Admitting: Family Medicine

## 2023-01-03 ENCOUNTER — Ambulatory Visit (INDEPENDENT_AMBULATORY_CARE_PROVIDER_SITE_OTHER): Payer: 59 | Admitting: Family Medicine

## 2023-01-03 VITALS — BP 126/81 | HR 84 | Ht 64.0 in | Wt 123.4 lb

## 2023-01-03 DIAGNOSIS — R1030 Lower abdominal pain, unspecified: Secondary | ICD-10-CM | POA: Diagnosis not present

## 2023-01-03 DIAGNOSIS — N3941 Urge incontinence: Secondary | ICD-10-CM

## 2023-01-03 DIAGNOSIS — R350 Frequency of micturition: Secondary | ICD-10-CM

## 2023-01-03 DIAGNOSIS — N951 Menopausal and female climacteric states: Secondary | ICD-10-CM

## 2023-01-03 DIAGNOSIS — M62838 Other muscle spasm: Secondary | ICD-10-CM | POA: Diagnosis not present

## 2023-01-03 MED ORDER — TIZANIDINE HCL 2 MG PO TABS: 2.0000 mg | ORAL_TABLET | Freq: Three times a day (TID) | ORAL | 0 refills | Status: DC | PRN

## 2023-01-03 NOTE — Assessment & Plan Note (Signed)
Discussed the results of her ultrasound.  Have already referred her to gynecology for further evaluation.  They tried calling her but she did not answer.  We scheduled her an appointment while she was here.  Needs evaluation of the endometrial thickening and the cervical lesion noted.  Also less acutely, the chronic abdominal pain which may be due to fibroids seen on her imaging. - Follow-up in 2 months

## 2023-01-03 NOTE — Progress Notes (Signed)
Established Patient Office Visit  Subjective   Patient ID: Cristina Boyd, female    DOB: 27-Apr-1970  Age: 52 y.o. MRN: [id number]  Chief Complaint  Patient presents with   Abdominal Pain   Back Pain    HPI  Patient is accompanied by her son-in-law.  We are following up on her hot flashes and her abdominal ultrasound.  Abdominal ultrasound results-we discussed the findings including thickened endometrium and the cervical mass.  Also with the presence of fibroids.  Patient notes that her abdominal pain since taking the citalopram feels like it has shifted lower to the suprapubic region.  She has some urinary frequency but no dysuria.  Hot flashes-patient states that flushes generally are better but last night she developed hot flashes and feels like they are continuing in to today.  Patient also is complaining at this time of pain in the back near the trapezius on the left side.  Also with "weakness" down the arm and the left side and paresthesia.  Also feels like her legs are "getting weaker".Denies dysarthria, aphasia, chest pain.     The 10-year ASCVD risk score (Arnett DK, et al., 2019) is: 1.2%  Health Maintenance Due  Topic Date Due   Zoster Vaccines- Shingrix (1 of 2) Never done   INFLUENZA VACCINE  10/05/2022   COVID-19 Vaccine (2 - 2023-24 season) 11/05/2022      Objective:     BP 126/81   Pulse 84   Ht 5\' 4"  (1.626 m)   Wt 123 lb 6.4 oz (56 kg)   LMP 06/06/2022   SpO2 98%   BMI 21.18 kg/m    Physical Exam General: Alert, oriented.  Accompanied by son-in-law who does some translating but patient understands most of what is said. HEENT: PERRLA, EOMI, cranial nerves II through XII grossly intact. CV: Regular rate rhythm Pulmonary: Lungs clear bilaterally Neuro: Strength equal bilaterally, sensation intact bilaterally.   No results found for any visits on 01/03/23.      Assessment & Plan:   Lower abdominal pain Assessment & Plan: Discussed the results of  her ultrasound.  Have already referred her to gynecology for further evaluation.  They tried calling her but she did not answer.  We scheduled her an appointment while she was here.  Needs evaluation of the endometrial thickening and the cervical lesion noted.  Also less acutely, the chronic abdominal pain which may be due to fibroids seen on her imaging. - Follow-up in 2 months   Urinary frequency -     Urinalysis, Routine w reflex microscopic  Cervical paraspinal muscle spasm Assessment & Plan: Her symptoms of left-sided neck pain and paresthesia down the arm most consistent with pinched nerve.  No concern for CVA based on physical exam and history.  Symptoms not consistent with cardiac issue.  Will prescribe tizanidine which may also help with her hot flashes.  Orders: -     tiZANidine HCl; Take 1 tablet (2 mg total) by mouth every 8 (eight) hours as needed for muscle spasms.  Dispense: 30 tablet; Refill: 0  Perimenopausal vasomotor symptoms Assessment & Plan: Generally symptoms improving with citalopram but she states last night and this morning it has been worse.  This may be due to coexisting cervical muscle spasm that started. - Continue citalopram - Prescribed tizanidine as needed.  Discussed avoiding taking this medication prior to driving due to the drowsiness that may occur. - Avoid hormonal therapies at this time.    I spent 30  minutes in the treatment of this patient  Return in about 2 months (around 03/05/2023) for gyn complaints.    Sandre Kitty, MD

## 2023-01-03 NOTE — Patient Instructions (Addendum)
It was nice to see you today,  We addressed the following topics today: -I have added a medicine called tizanidine for your hot flashes and muscle pain.  I think the pain in your back is more from a pinched nerve which can be helped by the muscle relaxant.  Do not take this prior to driving as it can make you drowsy. - Continue taking your citalopram - We will help you schedule a appointment with the gynecologist - We will get a urine sample to check for UTI - Reasons to go to the emergency department would be difficulty speaking, slurring of words, weakness or paralysis on one side, or chest pain.  Have a great day,  Frederic Jericho, MD

## 2023-01-03 NOTE — Assessment & Plan Note (Signed)
Her symptoms of left-sided neck pain and paresthesia down the arm most consistent with pinched nerve.  No concern for CVA based on physical exam and history.  Symptoms not consistent with cardiac issue.  Will prescribe tizanidine which may also help with her hot flashes.

## 2023-01-03 NOTE — Telephone Encounter (Signed)
Pt son calling to say that the Rx that was sent today was sent to a pharmacy that is permanently closed.  Please send to the [facility] on [address]

## 2023-01-03 NOTE — Assessment & Plan Note (Signed)
Generally symptoms improving with citalopram but she states last night and this morning it has been worse.  This may be due to coexisting cervical muscle spasm that started. - Continue citalopram - Prescribed tizanidine as needed.  Discussed avoiding taking this medication prior to driving due to the drowsiness that may occur. - Avoid hormonal therapies at this time.

## 2023-01-03 NOTE — Assessment & Plan Note (Signed)
Patient complaining of urinary frequency.  May have overactive bladder.  No dysuria, but patient would like to be checked for UTI.  Reasonable given the language barrier and the complaint of feeling hot since yesterday.

## 2023-01-03 NOTE — Telephone Encounter (Signed)
Sent in to new pharmacy 

## 2023-01-04 LAB — URINALYSIS, ROUTINE W REFLEX MICROSCOPIC
Bilirubin, UA: NEGATIVE
Glucose, UA: NEGATIVE
Leukocytes,UA: NEGATIVE
Nitrite, UA: NEGATIVE
Protein,UA: NEGATIVE
RBC, UA: NEGATIVE
Specific Gravity, UA: 1.008 (ref 1.005–1.030)
Urobilinogen, Ur: 0.2 mg/dL (ref 0.2–1.0)
pH, UA: 7 (ref 5.0–7.5)

## 2023-01-05 ENCOUNTER — Encounter: Payer: Self-pay | Admitting: Obstetrics and Gynecology

## 2023-01-05 ENCOUNTER — Other Ambulatory Visit (HOSPITAL_COMMUNITY)
Admission: RE | Admit: 2023-01-05 | Discharge: 2023-01-05 | Disposition: A | Discharge status: Home / Self Care | Payer: 59 | Source: Ambulatory Visit | Attending: Obstetrics and Gynecology | Admitting: Obstetrics and Gynecology

## 2023-01-05 ENCOUNTER — Ambulatory Visit: Payer: 59 | Admitting: Obstetrics and Gynecology

## 2023-01-05 VITALS — BP 112/70 | HR 74 | Ht 62.75 in | Wt 116.0 lb

## 2023-01-05 DIAGNOSIS — N859 Noninflammatory disorder of uterus, unspecified: Secondary | ICD-10-CM

## 2023-01-05 DIAGNOSIS — D219 Benign neoplasm of connective and other soft tissue, unspecified: Secondary | ICD-10-CM | POA: Diagnosis present

## 2023-01-05 DIAGNOSIS — R102 Pelvic and perineal pain: Secondary | ICD-10-CM | POA: Insufficient documentation

## 2023-01-05 DIAGNOSIS — Z01812 Encounter for preprocedural laboratory examination: Secondary | ICD-10-CM | POA: Diagnosis not present

## 2023-01-05 DIAGNOSIS — N888 Other specified noninflammatory disorders of cervix uteri: Secondary | ICD-10-CM

## 2023-01-05 LAB — PREGNANCY, URINE: Preg Test, Ur: NEGATIVE

## 2023-01-05 NOTE — Progress Notes (Signed)
Patient states she stopped her period in April.   Acute Office Visit  Subjective:    Patient ID: Cristina Boyd, female    DOB: 1970/08/28, 52 y.o.   MRN: [id number]   HPI 52 y.o. presents today for consult for pelvic pain, fibroids, thickened endometrial lining (11mm) in menopause, cervical mass.  Establish Care (BC-condoms/P-08/30/2022-WNL, HPV- neg, M-07/06/2022-neg birads 1, C-09/11/2022-Due 09/2029) and GYN Problem (New Pt-Mass in Cervix-Seen by Dr. Penni Bombard in 2021/Referral from PCP visit on 12/05/2022-c/o lower abdominal pains and rt flank pains that occur most days. No cycle since 06/2022./Pelvic US-12/07/2022-multiple small fibroids, Lining 11mm, endocervical polyp vs mass seen./Pt reports today that the pelvic/lower abdomen discomfort daily and right sided pains intermittently. Still no vaginal bleeding since 06/2022. ) . The pain wakes her up at night.  Pain is just like her menstrual cramps and use to come before her period and now is all the time and she has to use a heating pad.  She does not want to take any medications and does not like pills, but she is hurting.  She stopped her period in April and has not bled since then. Denies any family history of GYN cancers Pap smear in epic 6/24 normal  Patient's last menstrual period was 06/06/2022 (exact date).    Review of Systems     Objective:    Physical Exam Constitutional:      Appearance: Normal appearance.  Genitourinary:     Vulva and urethral meatus normal.     No lesions in the vagina.     Genitourinary Comments: 7 wk size with multiple fibroids       Right Labia: No rash, lesions or skin changes.    Left Labia: No lesions, skin changes or rash.    No vaginal discharge or tenderness.     No vaginal prolapse present.    No vaginal atrophy present.     Right Adnexa: not tender, not palpable and no mass present.    Left Adnexa: not tender, not palpable and no mass present.    No cervical motion tenderness, discharge,  lesion or polyp.     Uterus is enlarged and irregular.     Uterus is not tender.     Uterus is anteverted.  Breasts:    Right: Normal.     Left: Normal.  HENT:     Head: Normocephalic.  Neck:     Thyroid: No thyroid mass, thyromegaly or thyroid tenderness.  Cardiovascular:     Heart sounds: S1 normal and S2 normal.  Pulmonary:     Breath sounds: Normal air entry.  Abdominal:     General: There is no distension.     Palpations: Abdomen is soft. There is no mass.     Tenderness: There is no abdominal tenderness. There is no guarding or rebound.  Musculoskeletal:        General: Normal range of motion.     Cervical back: Full passive range of motion without pain, normal range of motion and neck supple. No tenderness.     Right lower leg: No edema.     Left lower leg: No edema.  Neurological:     Mental Status: She is alert.  Skin:    General: Skin is warm.  Psychiatric:        Mood and Affect: Mood normal.        Behavior: Behavior normal.        Thought Content: Thought content normal.  Vitals and nursing note reviewed.  Exam conducted with a chaperone present.     BP 112/70   Pulse 74   Ht 5' 2.75" (1.594 m)   Wt 116 lb (52.6 kg)   LMP 06/06/2022 (Exact Date)   SpO2 99%   BMI 20.71 kg/m  Wt Readings from Last 3 Encounters:  01/05/23 116 lb (52.6 kg)  01/03/23 123 lb 6.4 oz (56 kg)  12/05/22 126 lb (57.2 kg)    PROCEDURE: EMB Consent obtained for the procedure.  A bivalve speculum was placed in the vagina.  The cervix was grasped with a single tooth tenaculum.  Pipelle was inserted and rotated. Uterus US to 7cm.  Adequate specimen was obtained and sent to pathology.  All instruments were removed.  Patient tolerated the procedure well.  To notify patient of the results. No cervical lesions seen.  [place] used to try to remove endocervical mass and most likely a fibroid as it could not be removed.      Patient informed chaperone available to be present for breast  and/or pelvic exam. Patient has requested no chaperone to be present. Patient has been advised what will be completed during breast and pelvic exam.   Assessment & Plan:  Pre-procedure lab exam -     Pregnancy, urine    Past Medical History:  Diagnosis Date   Allergy    [employer]reesia 03/07/2020   Anxiety    Arthritis    Fibroadenoma    GERD (gastroesophageal reflux disease)    Pre-diabetes    History reviewed. No pertinent surgical history. Social History   Socioeconomic History   Marital status: Married    Spouse name: Not on file   Number of children: Not on file   Years of education: Not on file   Highest education level: Not on file  Occupational History   Not on file  Tobacco Use   Smoking status: Never   Smokeless tobacco: Never  Vaping Use   Vaping status: Never Used  Substance and Sexual Activity   Alcohol use: No   Drug use: No   Sexual activity: Yes    Partners: Male    Birth control/protection: Condom    Comment: 1st intercourse 41 yo,1 partner  Other Topics Concern   Not on file  Social History Narrative   Marital status: married x 25 years   From [place]      Children: 4 children (23, 22, 20, 17); no grandchildren      Lives: with husband, 2 children; 2 in college      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 No pertinent surgical history.   [name]   US Pelvic  Complete With Transvaginal (Accession [id number]) (Order [id number]) Imaging Date: 12/07/2022 Department: [facility] Released By: Robbie Louis Authorizing: Sandre Kitty, MD   Exam Status  Status  Final [99]   PACS Intelerad Image Link   Show images for US Pelvic Complete With Transvaginal Study Result  Narrative & Impression  CLINICAL DATA:  Abdominal pain   EXAM: TRANSABDOMINAL AND TRANSVAGINAL ULTRASOUND OF PELVIS   TECHNIQUE: Both transabdominal and transvaginal ultrasound examinations of the pelvis were performed. Transabdominal technique was performed for global imaging of the pelvis including uterus, ovaries, adnexal regions, and pelvic cul-de-sac. It was necessary to proceed with endovaginal exam following the transabdominal exam to visualize the endometrium.   COMPARISON:  03/27/2019   FINDINGS: Uterus   Measurements: 5.3 x 3.5 x 3.6 cm = volume: 34 mL. Multiple small uterine fibroids again visualized, largest measures up to 1.6 cm. Heterogeneously hypoechoic area centrally within the cervix measuring approximately 1.5 x 0.9 x 1.3 cm.   Endometrium   Thickness: 11 mm.  No focal abnormality visualized.   Right ovary   Not visualized.   Left ovary   Not visualized.   Other findings   No abnormal free fluid.   IMPRESSION: 1. Heterogeneously hypoechoic area centrally within the cervix measuring approximately 1.5 x 0.9 x 1.3 cm. Endocervical polyp or mass are not excluded. Consider further evaluation with direct visualization. If further imaging is desired, a pelvic MRI without and with contrast could be performed. 2. Multiple small uterine fibroids again visualized, largest measures up to 1.6 cm. 3. Nonvisualization of the ovaries.     Electronically Signed   By: Duanne Guess D.O.   On: 12/20/2022 11:37    A/p fibroid uterus, endocervical mass seen on [place] likely fibroid, thickened endometrial lining in  menopause, pelvic pain  All options discussed with patient in detail.  The r/b/a/I of each were discussed.  Pain resembles menstrual pain and she would likely benefit from the [facility] and minimize repeat procedures in the future.  Discussed that she still may have pain in the future, even with surgery, however.  She voiced understanding.  The r/b/a/I  and what to expect of the [facility] was discussed.  Also reviewed with patient that if her EMB shows endometrial cancer,she would have to have surgery with gyn onc. 30 minutes spent on reviewing records, imaging,  and one on one patient time and counseling patient and documentation Dr. Karma Greaser

## 2023-01-09 LAB — SURGICAL PATHOLOGY

## 2023-01-16 ENCOUNTER — Encounter: Payer: Self-pay | Admitting: *Deleted

## 2023-01-26 ENCOUNTER — Ambulatory Visit (INDEPENDENT_AMBULATORY_CARE_PROVIDER_SITE_OTHER): Payer: 59 | Admitting: Gastroenterology

## 2023-01-26 ENCOUNTER — Encounter: Payer: Self-pay | Admitting: Gastroenterology

## 2023-01-26 VITALS — BP 116/62 | HR 65 | Ht 63.0 in | Wt 128.0 lb

## 2023-01-26 DIAGNOSIS — Z8719 Personal history of other diseases of the digestive system: Secondary | ICD-10-CM

## 2023-01-26 DIAGNOSIS — K219 Gastro-esophageal reflux disease without esophagitis: Secondary | ICD-10-CM | POA: Diagnosis not present

## 2023-01-26 DIAGNOSIS — R1013 Epigastric pain: Secondary | ICD-10-CM

## 2023-01-26 DIAGNOSIS — K2970 Gastritis, unspecified, without bleeding: Secondary | ICD-10-CM

## 2023-01-26 DIAGNOSIS — M542 Cervicalgia: Secondary | ICD-10-CM | POA: Diagnosis not present

## 2023-01-26 MED ORDER — OMEPRAZOLE 20 MG PO CPDR: 20.0000 mg | DELAYED_RELEASE_CAPSULE | ORAL | 11 refills | Status: DC

## 2023-01-26 MED ORDER — FAMOTIDINE 20 MG PO TABS: 20.0000 mg | ORAL_TABLET | Freq: Every day | ORAL | 11 refills | Status: DC

## 2023-01-26 NOTE — Progress Notes (Signed)
Cristina Boyd    [id number]    1970-08-03  Primary Care Physician:Olson, Mabeline Caras, MD  Referring Physician: Sandre Kitty, MD [address] [place],  [place] [address]   Chief complaint:  GERD  Discussed the use of AI scribe software for clinical note transcription with the patient, who gave verbal consent to proceed.  History of Present Illness   Cristina Boyd, a patient with a history of gastritis, presents with improved but persistent symptoms of heartburn and acid reflux, particularly after late meals. She reports a sensation of dryness in the throat. She has been taking omeprazole and Pepcid intermittently, which seems to provide some relief. She denies any severe abdominal pain.  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 She denies any joint pains or skin rashes.  In terms of family history, she denies any known cases of cancer or gallbladder disease. The patient's last endoscopy in 2019 revealed gastritis but no Helicobacter pylori infection. She does not consume alcohol.      Colonoscopy September 11, 2022 - One 4 mm polyp in the transverse colon, removed with a cold snare. Resected and retrieved. - Diverticulosis in the sigmoid colon and in the descending colon. - Non- bleeding external and internal hemorrhoids. - Anal papilla( e) were hypertrophied.  EGD November 07, 2017 - Z- line regular, 35 cm from the incisors. - Normal esophagus. - Gastritis with hemorrhage. Biopsied. - Normal examined duodenum.   Outpatient Encounter Medications as of 51/22/2024  Medication Sig   Blood Glucose Monitoring Suppl DEVI 1 each by Does not apply route daily. May substitute to any manufacturer covered by patient's insurance.   citalopram (CELEXA) 10 MG tablet Take 1 tablet (10 mg total) by mouth daily.   famotidine (PEPCID) 20 MG tablet Take 1 tablet (20 mg total) by mouth at bedtime.   Glucose  Blood (BLOOD GLUCOSE TEST STRIPS) STRP 1 each by In Vitro route daily. May substitute to any manufacturer covered by patient's insurance.   Multiple Vitamin (MULTIVITAMIN) tablet Take 1 tablet by mouth daily.   tiZANidine (ZANAFLEX) 2 MG tablet Take 1 tablet (2 mg total) by mouth every 8 (eight) hours as needed for muscle spasms.   [DISCONTINUED] omeprazole (PRILOSEC) 20 MG capsule Take 1 capsule (20 mg total) by mouth 2 (two) times daily before a meal.   omeprazole (PRILOSEC) 20 MG capsule Take 1 capsule (20 mg total) by mouth every morning.   No facility-administered encounter medications on file as of 51/22/2024.    Allergies as of 51/22/2024   (No Known Allergies)    Past Medical History:  Diagnosis Date   Allergy    Phreesia 03/07/2020   Anxiety    Arthritis    Fibroadenoma    GERD (gastroesophageal reflux disease)    Pre-diabetes     History reviewed. No pertinent surgical history.  Family History  Problem Relation Age of Onset   Hypertension Mother    Stroke Mother 63       mild CVA/TIA   Liver cancer Maternal Grandfather    Colon cancer Neg Hx    Colon polyps Neg Hx    Esophageal cancer Neg Hx    Rectal cancer Neg Hx    Stomach cancer Neg Hx     Social History   Socioeconomic History   Marital status: Married    Spouse name: Not on file  Number of children: Not on file   Years of education: Not on file   Highest education level: Not on file  Occupational History   Not on file  Tobacco Use   Smoking status: Never   Smokeless tobacco: Never  Vaping Use   Vaping status: Never Used  Substance and Sexual Activity   Alcohol use: No   Drug use: No   Sexual activity: Yes    Partners: Male    Birth control/protection: Condom    Comment: 1st intercourse 30 yo,1 partner  Other Topics Concern   Not on file  Social History Narrative   Marital status: married x 25 years   From [place]      Children: 4 children (23, 22, 20, 17); no grandchildren       Lives: with husband, 2 children; 2 in college      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   Physical Exam: Vitals:   01/26/23 0952  BP: 116/62  Pulse: 65   Body mass index is 22.67 kg/m. Gen:      No acute distress HEENT:  sclera anicteric Abd:      soft, non-tender; no palpable masses, no distension Ext:    No edema Neuro: alert and oriented x 3 Psych: normal mood and affect  Data Reviewed:  Reviewed labs, radiology imaging, old records and pertinent past GI work up  Results   DIAGNOSTIC Upper endoscopy (2019): Gastritis, no Helicobacter pylori       Assessment and Plan/Recommendations:     Gastroesophageal Reflux Disease (GERD) Improved symptoms with occasional late-night exacerbations. History of gastritis on endoscopy in 2019, no Helicobacter pylori. Discussed the option of repeat endoscopy if symptoms persist. -Continue Omeprazole daily, 20 minutes before breakfast. -Add Famotidine in the evening for nocturnal reflux symptoms. -Avoid late-night eating, especially before bed. -Check back in a few months for symptom reassessment.  Cervical Neck Pain Likely related to occupational posture during nail work. -Advised to maintain a straight posture during work to avoid excessive bending.  General Health Maintenance -Will send prescription for Famotidine to [facility]. Patient advised to compare prices with over-the-counter options.       The patient was provided an opportunity  to ask questions and all were answered. The patient agreed with the plan and demonstrated an understanding of the instructions.  Cristina Boyd , MD    CC: Sandre Kitty, MD

## 2023-01-26 NOTE — Patient Instructions (Addendum)
VISIT SUMMARY:  During today's visit, we discussed your ongoing symptoms of heartburn and acid reflux, as well as your cervical neck pain. We reviewed your history of gastritis and your current medications. We also talked about some lifestyle changes that may help improve your symptoms.  YOUR PLAN:  -GASTROESOPHAGEAL REFLUX DISEASE (GERD): GERD is a condition where stomach acid frequently flows back into the tube connecting your mouth and stomach, causing heartburn and acid reflux. You should continue taking Omeprazole daily, 20 minutes before breakfast, and add Famotidine in the evening. Avoid eating late at night, especially before bed. We will reassess your symptoms in a few months and consider a repeat endoscopy if needed.  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 Try to maintain a straight posture during work to avoid excessive bending.  -GENERAL HEALTH MAINTENANCE: We will send a prescription for Famotidine to [facility]. You may want to compare prices with over-the-counter options.  INSTRUCTIONS:  Please continue taking Omeprazole daily and add Famotidine in the evening. Avoid eating late at night, especially before bed. Try to maintain a straight posture during work to help with your neck pain. We will reassess your symptoms in a few months. If your symptoms persist, we may consider a repeat endoscopy.  We have sent the following medications to your pharmacy for you to pick up at your convenience: Omeprazole, famotidine   Thank you for choosing me and  Gastroenterology.  Dr. Marsa Aris

## 2023-02-06 ENCOUNTER — Encounter: Payer: Self-pay | Admitting: Gastroenterology

## 2023-03-06 ENCOUNTER — Ambulatory Visit: Payer: Self-pay | Admitting: Family Medicine

## 2023-03-06 NOTE — Progress Notes (Deleted)
   Established Patient Office Visit  Subjective   Patient ID: Cristina Boyd, female    DOB: [birth year]  Age: 52 y.o. MRN: [id number]  No chief complaint on file.   HPI  Gynecology-referral was made for surgery?  Fibroids  GERD-omeprazole  every morning.  Famotidine  p.m.  Hot flashes-citalopram .  Did tizanidine  help?  Prediabetes-last A1c 6 months ago was 6.4.  Dietary changes?   The 10-year ASCVD risk score (Arnett DK, et al., 2019) is: 1%  Health Maintenance Due  Topic Date Due   Zoster Vaccines- Shingrix (1 of 2) Never done   INFLUENZA VACCINE  10/05/2022   COVID-19 Vaccine (2 - 2024-25 season) 11/05/2022      Objective:     There were no vitals taken for this visit. {Vitals History (Optional):23777}  Physical Exam   No results found for any visits on 03/06/23.      Assessment & Plan:   There are no diagnoses linked to this encounter.   No follow-ups on file.    Toribio MARLA Slain, MD

## 2023-04-24 ENCOUNTER — Encounter: Payer: Self-pay | Admitting: Gastroenterology

## 2023-05-24 ENCOUNTER — Encounter: Payer: Self-pay | Admitting: Family Medicine

## 2023-05-24 ENCOUNTER — Ambulatory Visit (INDEPENDENT_AMBULATORY_CARE_PROVIDER_SITE_OTHER): Admitting: Family Medicine

## 2023-05-24 VITALS — BP 105/70 | HR 67 | Ht 63.0 in | Wt 124.4 lb

## 2023-05-24 DIAGNOSIS — R300 Dysuria: Secondary | ICD-10-CM

## 2023-05-24 DIAGNOSIS — R7303 Prediabetes: Secondary | ICD-10-CM | POA: Diagnosis not present

## 2023-05-24 DIAGNOSIS — R109 Unspecified abdominal pain: Secondary | ICD-10-CM

## 2023-05-24 LAB — POCT URINALYSIS DIP (CLINITEK)
Bilirubin, UA: NEGATIVE
Blood, UA: NEGATIVE
Glucose, UA: NEGATIVE mg/dL
Ketones, POC UA: NEGATIVE mg/dL
Leukocytes, UA: NEGATIVE
Nitrite, UA: NEGATIVE
POC PROTEIN,UA: NEGATIVE
Spec Grav, UA: 1.01 (ref 1.010–1.025)
Urobilinogen, UA: 0.2 U/dL
pH, UA: 7 (ref 5.0–8.0)

## 2023-05-24 MED ORDER — MELOXICAM 15 MG PO TABS: 15.0000 mg | ORAL_TABLET | Freq: Every day | ORAL | 0 refills | Status: DC

## 2023-05-24 MED ORDER — BLOOD GLUCOSE TEST VI STRP: 1.0000 | ORAL_STRIP | Freq: Every day | 2 refills | Status: DC

## 2023-05-24 NOTE — Patient Instructions (Addendum)
 It was nice to see you today,  We addressed the following topics today: -I would like you to take 1000 mg Tylenol during the day every 8 hours as needed. - I would like you to take 1 tablet of the meloxicam in the afternoon.  This should help with pain at night - I need you to come back to schedule a lab visit for blood test. - After your blood test come back we can order a CT scan if appropriate. -Your urinalysis was negative.  Have a great day,  Frederic Jericho, MD

## 2023-05-24 NOTE — Assessment & Plan Note (Signed)
 2 weeks right sided pain that wraps around to the flank.  Usually worse in the evening.  No associated with gastrointestinal symptoms.  Not worsened by physical activity.  Some dysuria over the past few days but otherwise no urologic symptoms.  Negative urinalysis.  States the pain is different than the fibroid pain.  Getting urinalysis, will get CMP and CBC.  Consider CT stone study.  Differential includes musculoskeletal pain, hepatic or ovarian cyst, floating rib pain,

## 2023-05-24 NOTE — Progress Notes (Signed)
   Acute Office Visit  Subjective:     Patient ID: Cristina Boyd, female    DOB: [birth year], 53 y.o.   MRN: [id number]  Chief Complaint  Patient presents with   Flank Pain    HPI Patient is in today for right flank pain.    Patient has had right sided abdominal pain for 2 weeks.  Is worsening.  Is generally worse in the evenings and at night.  Will wake her up from sleep.  She has had a few days of dysuria when she urinates but otherwise no urinary symptoms.  No nausea vomiting or diarrhea.  Pain is not worse with meals or exercise.  She uses a heating pad and sometimes Advil but Advil does not last long so she stopped using it.  She has not signed up for the hysterectomy procedure yet for her fibroids.  She is afraid to have the surgery.  States this current pain is different than her fibroid pain.   ROS      Objective:    BP 105/70   Pulse 67   Ht 5\' 3"  (1.6 m)   Wt 124 lb 6.4 oz (56.4 kg)   SpO2 99%   BMI 22.04 kg/m     Physical Exam General: Alert, oriented GI: Nontender to palpation MSK: No CVA tenderness.  Patient locates pain right on the end of the 11th and 12th ribs on the right side.  But they are nontender to palpation.  Results for orders placed or performed in visit on 05/24/23  POCT URINALYSIS DIP (CLINITEK)  Result Value Ref Range   Color, UA yellow yellow   Clarity, UA clear clear   Glucose, UA negative negative mg/dL   Bilirubin, UA negative negative   Ketones, POC UA negative negative mg/dL   Spec Grav, UA 2.536 6.440 - 1.025   Blood, UA negative negative   pH, UA 7.0 5.0 - 8.0   POC PROTEIN,UA negative negative, trace   Urobilinogen, UA 0.2 0.2 or 1.0 E.U./dL   Nitrite, UA Negative Negative   Leukocytes, UA Negative Negative        Assessment & Plan:   Right sided abdominal pain Assessment & Plan: 2 weeks right sided pain that wraps around to the flank.  Usually worse in the evening.  No associated with gastrointestinal symptoms.  Not  worsened by physical activity.  Some dysuria over the past few days but otherwise no urologic symptoms.  Negative urinalysis.  States the pain is different than the fibroid pain.  Getting urinalysis, will get CMP and CBC.  Consider CT stone study.  Differential includes musculoskeletal pain, hepatic or ovarian cyst, floating rib pain,   Dysuria -     POCT URINALYSIS DIP (CLINITEK)  Pre-diabetes -     Blood Glucose Test; 1 each by In Vitro route daily. May substitute to any manufacturer covered by patient's insurance.  Dispense: 30 each; Refill: 2  Other orders -     Meloxicam; Take 1 tablet (15 mg total) by mouth daily.  Dispense: 30 tablet; Refill: 0     Return in about 6 weeks (around 07/05/2023) for Flank pain.  Sandre Kitty, MD

## 2023-05-25 ENCOUNTER — Other Ambulatory Visit

## 2023-05-25 DIAGNOSIS — R300 Dysuria: Secondary | ICD-10-CM

## 2023-05-25 DIAGNOSIS — R109 Unspecified abdominal pain: Secondary | ICD-10-CM

## 2023-05-26 LAB — CBC WITH DIFFERENTIAL/PLATELET
Basophils Absolute: 0 10*3/uL (ref 0.0–0.2)
Basos: 1 %
EOS (ABSOLUTE): 0.1 10*3/uL (ref 0.0–0.4)
Eos: 3 %
Hematocrit: 40.6 % (ref 34.0–46.6)
Hemoglobin: 13.6 g/dL (ref 11.1–15.9)
Immature Grans (Abs): 0 10*3/uL (ref 0.0–0.1)
Immature Granulocytes: 0 %
Lymphocytes Absolute: 1.1 10*3/uL (ref 0.7–3.1)
Lymphs: 30 %
MCH: 31.3 pg (ref 26.6–33.0)
MCHC: 33.5 g/dL (ref 31.5–35.7)
MCV: 93 fL (ref 79–97)
Monocytes Absolute: 0.2 10*3/uL (ref 0.1–0.9)
Monocytes: 6 %
Neutrophils Absolute: 2.3 10*3/uL (ref 1.4–7.0)
Neutrophils: 60 %
Platelets: 267 10*3/uL (ref 150–450)
RBC: 4.35 x10E6/uL (ref 3.77–5.28)
RDW: 11.8 % (ref 11.7–15.4)
WBC: 3.7 10*3/uL (ref 3.4–10.8)

## 2023-05-26 LAB — COMPREHENSIVE METABOLIC PANEL
ALT: 20 IU/L (ref 0–32)
AST: 22 IU/L (ref 0–40)
Albumin: 4.7 g/dL (ref 3.8–4.9)
Alkaline Phosphatase: 74 IU/L (ref 44–121)
BUN/Creatinine Ratio: 12 (ref 9–23)
BUN: 8 mg/dL (ref 6–24)
Bilirubin Total: 0.5 mg/dL (ref 0.0–1.2)
CO2: 21 mmol/L (ref 20–29)
Calcium: 10 mg/dL (ref 8.7–10.2)
Chloride: 101 mmol/L (ref 96–106)
Creatinine, Ser: 0.68 mg/dL (ref 0.57–1.00)
Globulin, Total: 2.8 g/dL (ref 1.5–4.5)
Glucose: 104 mg/dL — ABNORMAL HIGH (ref 70–99)
Potassium: 4 mmol/L (ref 3.5–5.2)
Sodium: 140 mmol/L (ref 134–144)
Total Protein: 7.5 g/dL (ref 6.0–8.5)
eGFR: 105 mL/min/{1.73_m2} (ref >59)

## 2023-05-26 LAB — SEDIMENTATION RATE: Sed Rate: 7 mm/h (ref 0–40)

## 2023-05-28 ENCOUNTER — Other Ambulatory Visit: Payer: Self-pay | Admitting: Family Medicine

## 2023-05-28 ENCOUNTER — Encounter: Payer: Self-pay | Admitting: Family Medicine

## 2023-05-28 DIAGNOSIS — R1011 Right upper quadrant pain: Secondary | ICD-10-CM

## 2023-06-11 ENCOUNTER — Encounter: Payer: Self-pay | Admitting: Family Medicine

## 2023-06-11 ENCOUNTER — Other Ambulatory Visit

## 2023-06-21 ENCOUNTER — Other Ambulatory Visit: Payer: Self-pay | Admitting: Family Medicine

## 2023-06-30 ENCOUNTER — Encounter: Payer: Self-pay | Admitting: Family Medicine

## 2023-07-04 ENCOUNTER — Ambulatory Visit
Admission: RE | Admit: 2023-07-04 | Discharge: 2023-07-04 | Disposition: A | Discharge status: Home / Self Care | Source: Ambulatory Visit | Attending: Family Medicine

## 2023-07-04 DIAGNOSIS — R1011 Right upper quadrant pain: Secondary | ICD-10-CM

## 2023-07-12 ENCOUNTER — Encounter: Payer: Self-pay | Admitting: Family Medicine

## 2023-07-12 ENCOUNTER — Ambulatory Visit (INDEPENDENT_AMBULATORY_CARE_PROVIDER_SITE_OTHER): Admitting: Family Medicine

## 2023-07-12 VITALS — BP 113/75 | HR 58 | Ht 63.0 in | Wt 123.0 lb

## 2023-07-12 DIAGNOSIS — R109 Unspecified abdominal pain: Secondary | ICD-10-CM | POA: Diagnosis not present

## 2023-07-12 DIAGNOSIS — R7303 Prediabetes: Secondary | ICD-10-CM

## 2023-07-12 DIAGNOSIS — K824 Cholesterolosis of gallbladder: Secondary | ICD-10-CM

## 2023-07-12 DIAGNOSIS — R202 Paresthesia of skin: Secondary | ICD-10-CM

## 2023-07-12 DIAGNOSIS — N951 Menopausal and female climacteric states: Secondary | ICD-10-CM

## 2023-07-12 MED ORDER — CITALOPRAM HYDROBROMIDE 10 MG PO TABS
10.0000 mg | ORAL_TABLET | Freq: Every day | ORAL | 3 refills | Status: AC
Start: 2023-07-12 — End: ?

## 2023-07-12 NOTE — Progress Notes (Signed)
 Established Patient Office Visit  Subjective   Patient ID: Cristina Boyd, female    DOB: 10-Mar-1970  Age: 53 y.o. MRN: [id number]  No chief complaint on file.   HPI   Subjective - Abdominal pain, bilateral lower abdominal pain for years - Right-sided abdominal pain since March, located in right upper quadrant, radiates to back - Pain worsens when lying on right side - When pain occurs, feels weak and tired - No nausea or vomiting - Denies current pain on exam  - Diabetic with concerns about blood sugar levels - Reports symptoms of tingling/needle sensation in feet at blood sugar of 120 - Concerned about symptoms despite relatively normal glucose readings  - Reports improvement of lower abdominal pain with citalopram  - Ran out of medication "a long time ago" after taking for one month - Reports feeling depressed  Medications: meloxicam  taken in evening for pain with limited relief, citalopram  previously taken for mood and menopausal symptoms with good effect.  PMH: diabetes, GERD (had colonoscopy), gallbladder polyps, menopausal symptoms.  PSH: none mentioned.  FH: none mentioned.  Social Hx: works regularly, feels strong and healthy when not experiencing pain.  ROS: positive for bilateral lower abdominal pain, right upper quadrant pain, foot tingling, hot flashes. Negative for nausea, vomiting.    The 10-year ASCVD risk score (Arnett DK, et al., 2019) is: 1.1%  Health Maintenance Due  Topic Date Due   Zoster Vaccines- Shingrix (1 of 2) Never done   COVID-19 Vaccine (2 - 2024-25 season) 11/05/2022      Objective:     BP 113/75   Pulse (!) 58   Ht 5\' 3"  (1.6 m)   Wt 123 lb (55.8 kg)   SpO2 100%   BMI 21.79 kg/m    Physical Exam General: Alert, oriented CV: Regular rhythm Pulmonary: Lungs clear bilaterally GI: Soft, normal bowel sounds.  Nontender to palpation.  No flank pain or CVA tenderness.      Assessment & Plan:   Gallbladder polyp Assessment  & Plan: Seen in 2019 for workup of similar symptoms of right sided abdominal pain, last imaged in 2020. She declined surgery at that time.  Again still hesitant regarding surgical treatment.  Advised her treatment is limited beyond surgical intervention.  Will get repeat RUQ u/s.    Orders: -     US  ABDOMEN LIMITED RUQ (LIVER/GB); Future  Paresthesia Assessment & Plan: Unlikely to be related to blood sugar.  She stated she felt symptoms when her blood sugars were in the 120s.  Getting TSH and B12 level.  Continue to monitor.  Orders: -     TSH -     B12 and Folate Panel -     Methylmalonic acid, serum  Prediabetes Assessment & Plan: Patient has improved diet.  Rechecking A1c today.  Orders: -     Hemoglobin A1c  Right sided abdominal pain Assessment & Plan: Still has issues with this.  Still stating it is separate from her lower abdominal pains.  Has history of gallbladder polyps, last imaged in 2020.  This could be causing her symptoms.  Advised her that treatment is surgical.  She was hesitant regarding this option.  Will get right upper quadrant ultrasound.  Her recent renal CT scan was completely normal.   Perimenopausal vasomotor symptoms Assessment & Plan: Patient only took the citalopram  for a month but did say it was helping her symptoms when she took it.  Also would like to take it for mood as  well as this helped when she took it.  Sending in 90-day prescription of escitalopram 10 mg with 3 refills.   Other orders -     Citalopram  Hydrobromide; Take 1 tablet (10 mg total) by mouth daily.  Dispense: 90 tablet; Refill: 3     Return in about 3 months (around 10/12/2023) for abd pain.    Laneta Pintos, MD

## 2023-07-12 NOTE — Assessment & Plan Note (Signed)
 Patient has improved diet.  Rechecking A1c today.

## 2023-07-12 NOTE — Assessment & Plan Note (Signed)
 Still has issues with this.  Still stating it is separate from her lower abdominal pains.  Has history of gallbladder polyps, last imaged in 2020.  This could be causing her symptoms.  Advised her that treatment is surgical.  She was hesitant regarding this option.  Will get right upper quadrant ultrasound.  Her recent renal CT scan was completely normal.

## 2023-07-12 NOTE — Assessment & Plan Note (Signed)
 Unlikely to be related to blood sugar.  She stated she felt symptoms when her blood sugars were in the 120s.  Getting TSH and B12 level.  Continue to monitor.

## 2023-07-12 NOTE — Assessment & Plan Note (Signed)
 Patient only took the citalopram  for a month but did say it was helping her symptoms when she took it.  Also would like to take it for mood as well as this helped when she took it.  Sending in 90-day prescription of escitalopram 10 mg with 3 refills.

## 2023-07-12 NOTE — Patient Instructions (Signed)
 It was nice to see you today,  We addressed the following topics today: -I am sending in a order for a right upper quadrant ultrasound.  Someone will call you to schedule this - I am sending in a prescription for citalopram  with enough refills to last you for the next year.  Take this once a day - I am ordering some blood tests and I will send you a message regarding these results when I get them.  Have a great day,  Etha Henle, MD

## 2023-07-14 NOTE — Assessment & Plan Note (Signed)
 Seen in 2019 for workup of similar symptoms of right sided abdominal pain, last imaged in 2020. She declined surgery at that time.  Again still hesitant regarding surgical treatment.  Advised her treatment is limited beyond surgical intervention.  Will get repeat RUQ u/s.

## 2023-07-16 LAB — HEMOGLOBIN A1C
Est. average glucose Bld gHb Est-mCnc: 128 mg/dL
Hgb A1c MFr Bld: 6.1 % — ABNORMAL HIGH (ref 4.8–5.6)

## 2023-07-16 LAB — TSH: TSH: 1.1 u[IU]/mL (ref 0.450–4.500)

## 2023-07-16 LAB — METHYLMALONIC ACID, SERUM: Methylmalonic Acid: 58 nmol/L (ref 0–378)

## 2023-07-16 LAB — B12 AND FOLATE PANEL
Folate: 17 ng/mL (ref >3.0)
Vitamin B-12: 664 pg/mL (ref 232–1245)

## 2023-07-17 ENCOUNTER — Encounter: Payer: Self-pay | Admitting: Family Medicine

## 2023-07-18 ENCOUNTER — Other Ambulatory Visit: Payer: Self-pay | Admitting: Family Medicine

## 2023-07-18 DIAGNOSIS — R109 Unspecified abdominal pain: Secondary | ICD-10-CM

## 2023-07-18 DIAGNOSIS — K824 Cholesterolosis of gallbladder: Secondary | ICD-10-CM

## 2023-07-24 ENCOUNTER — Other Ambulatory Visit: Payer: Self-pay | Admitting: Family Medicine

## 2023-07-24 NOTE — Telephone Encounter (Signed)
 LVM giving pt the information for her appointment. I also sent her a mychart message informing her of this and to call if this time does not work.

## 2023-07-24 NOTE — Addendum Note (Signed)
 Addended by: ZIMMERMAN RUMPLE, Refugia Laneve D on: 07/24/2023 09:09 AM   Modules accepted: Orders

## 2023-07-25 ENCOUNTER — Ambulatory Visit (HOSPITAL_COMMUNITY)
Admission: RE | Admit: 2023-07-25 | Discharge: 2023-07-25 | Disposition: A | Discharge status: Home / Self Care | Source: Ambulatory Visit | Attending: Family Medicine | Admitting: Family Medicine

## 2023-07-25 DIAGNOSIS — K824 Cholesterolosis of gallbladder: Secondary | ICD-10-CM | POA: Insufficient documentation

## 2023-07-26 ENCOUNTER — Ambulatory Visit: Payer: Self-pay | Admitting: Family Medicine

## 2023-08-01 ENCOUNTER — Other Ambulatory Visit: Payer: Self-pay | Admitting: Family Medicine

## 2023-08-01 MED ORDER — METFORMIN HCL ER 500 MG PO TB24: 500.0000 mg | ORAL_TABLET | Freq: Every day | ORAL | 2 refills | Status: DC

## 2023-08-03 ENCOUNTER — Ambulatory Visit: Payer: Self-pay | Admitting: Surgery

## 2023-08-03 NOTE — H&P (Signed)
 Cristina Boyd [id number]   Referring Provider:  Laneta Pintos, MD   Subjective   Chief Complaint: New Consultation     History of Present Illness:    53 year old woman with history of allergy, anxiety, arthritis, fibroids, GERD, prediabetes and no previous abdominal surgeries who presents for evaluation of gallbladder polyps and abdominal pain.  Per review of PCP notes, she has had bilateral lower abdominal pain for years and right sided abdominal pain in the right upper quadrant radiating to the back for about 3 months.  Pain is aggravated by lying on right side, not necessarily associated with eating.  Some associated nausea at least for the last few days.  She has a known history of gallbladder polyp first seen on ultrasound in 2019 for workup of similar right-sided abdominal pain symptoms.  At that time declined surgical consultation.  Remains appropriately nervous about the prospect of having surgery but is interested in considering this now given ongoing pain.  She notes that she is also considering having hysterectomy for fibroids and lower abdominal pain.  She has not yet met with an OB/GYN and does not wish to attempt a combined procedure currently.  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   Review of Systems: A complete review of systems was obtained from the patient.  I have reviewed this information and discussed as appropriate with the patient.  See HPI as well for other ROS.   Medical History: Past Medical History:  Diagnosis Date   Anxiety    Arthritis    GERD (gastroesophageal reflux disease)     History reviewed. No pertinent surgical history.   No Known Allergies  Current Outpatient Medications on File Prior to Visit  Medication Sig Dispense Refill   citalopram  (CELEXA ) 10 MG tablet Take 1 tablet by mouth once daily     meloxicam  (MOBIC ) 15 MG tablet Take 1 tablet by mouth once daily     No current facility-administered medications on file prior to visit.     Family History  Problem Relation Age of Onset   Stroke Mother    Skin cancer Mother    High blood pressure (Hypertension) Father      Social History   Tobacco Use  Smoking Status Never  Smokeless Tobacco Never     Social History   Socioeconomic History   Marital status: Married  Tobacco Use   Smoking status: Never   Smokeless tobacco: Never  Vaping Use   Vaping status: Never Used  Substance and Sexual Activity   Alcohol use: Not Currently   Drug use: Never    Objective:    Vitals:   08/03/23 1002  BP: 122/78  Pulse: 70  Temp: 36.7 C (98 F)  SpO2: 97%  Weight: 55.6 kg (122 lb 9.6 oz)  Height: 160 cm (5\' 3" )  PainSc:   4  PainLoc: Abdomen    Body mass index is 21.72 kg/m.  Gen: A&Ox3, no distress  Chest: respiratory effort is normal. Abdomen: soft, nondistended, nontender.  No palpable mass or organomegaly. Neuro: no gross deficit Psych: appropriate mood and affect, normal insight/judgment intact  Skin: warm and dry   Labs, Imaging and Diagnostic Testing:  Labs 3/21: CMP and CBC within normal limits  CT 4/30: No acute finding in the abdomen or pelvis.  US  5/21: ULTRASOUND ABDOMEN LIMITED RIGHT UPPER QUADRANT   COMPARISON:  Ultrasound from 11/01/2017.   FINDINGS: Gallbladder:   The gallbladder is physiologically distended. There are several hyperechoic  nonshadowing foci adherent to the gallbladder wall with largest measuring up to 4.6 mm, favored to represent gallbladder polyps. The largest polyp is pedunculated wild other polyps are sessile. No associated abnormal wall thickening. No pericholecystic free fluid. Sonographic Murphy's sign was not elicited.   Common bile duct:   Diameter: Up to 4.9 mm.  No intrahepatic bile duct dilation.   Liver:   No focal lesion identified. Within normal limits in parenchymal echogenicity. Portal vein is patent on color Doppler imaging with normal direction of blood flow towards the liver.    Other: None.   IMPRESSION: *Multiple gallbladder polyps with largest measuring up to 4.6 mm. No routine follow-up is recommended.   Management of Incidentally Detected Gallbladder Polyps: Society of Radiologists in Ultrasound Consensus Conference Recommendations (published online September 07, 2020):   [email or web address]   Extremely low-risk polyps, i.e. pedunculated ball-on-the-wall or thin stalk:   <9 mm: no follow-up   10-14 mm: follow-up at 6, 12 and 24 months   >15 mm: surgical consult   Low-risk polyps, i.e. pedunculated with a thick or wide stalk, or sessile:   ?6 mm: no follow-up   7-9 mm follow-up ultrasound at 12 months   10-14 mm: follow-up ultrasound at 6, 12, 24, and 36 months vs surgical consult   >15 mm: surgical consult   Intermediate risk: focal wall-thickening >24mm adjacent to polyp:   <6 mm: follow-up at 6, 12, 24, 36 months vs surgical consult   >7 mm: surgical consult  Assessment and Plan:  Diagnoses and all orders for this visit:  Biliary colic  Gallbladder polyp  I recommend proceeding with laparoscopic cholecystectomy with possible cholangiogram.  Discussed the relevant anatomy using a diagram to demonstrate, and went over surgical technique.  Discussed risks of surgery including bleeding, infection, pain, scarring, intraabdominal injury specifically to the common bile duct and sequelae, subtotal cholecystectomy, bile leak, conversion to open surgery, failure to resolve symptoms, post-cholecystectomy diarrhea which is typically self-limited, blood clots/ pulmonary embolus, heart attack, pneumonia, stroke, etc. Questions were welcomed and answered to patient's satisfaction.  Patient wishes to proceed with surgery.   Aldon Hung MD FACS

## 2023-08-03 NOTE — H&P (View-Only) (Signed)
 Cristina Boyd [id number]   Referring Provider:  Laneta Pintos, MD   Subjective   Chief Complaint: New Consultation     History of Present Illness:    53 year old woman with history of allergy, anxiety, arthritis, fibroids, GERD, prediabetes and no previous abdominal surgeries who presents for evaluation of gallbladder polyps and abdominal pain.  Per review of PCP notes, she has had bilateral lower abdominal pain for years and right sided abdominal pain in the right upper quadrant radiating to the back for about 3 months.  Pain is aggravated by lying on right side, not necessarily associated with eating.  Some associated nausea at least for the last few days.  She has a known history of gallbladder polyp first seen on ultrasound in 2019 for workup of similar right-sided abdominal pain symptoms.  At that time declined surgical consultation.  Remains appropriately nervous about the prospect of having surgery but is interested in considering this now given ongoing pain.  She notes that she is also considering having hysterectomy for fibroids and lower abdominal pain.  She has not yet met with an OB/GYN and does not wish to attempt a combined procedure currently.  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   Review of Systems: A complete review of systems was obtained from the patient.  I have reviewed this information and discussed as appropriate with the patient.  See HPI as well for other ROS.   Medical History: Past Medical History:  Diagnosis Date   Anxiety    Arthritis    GERD (gastroesophageal reflux disease)     History reviewed. No pertinent surgical history.   No Known Allergies  Current Outpatient Medications on File Prior to Visit  Medication Sig Dispense Refill   citalopram  (CELEXA ) 10 MG tablet Take 1 tablet by mouth once daily     meloxicam  (MOBIC ) 15 MG tablet Take 1 tablet by mouth once daily     No current facility-administered medications on file prior to visit.     Family History  Problem Relation Age of Onset   Stroke Mother    Skin cancer Mother    High blood pressure (Hypertension) Father      Social History   Tobacco Use  Smoking Status Never  Smokeless Tobacco Never     Social History   Socioeconomic History   Marital status: Married  Tobacco Use   Smoking status: Never   Smokeless tobacco: Never  Vaping Use   Vaping status: Never Used  Substance and Sexual Activity   Alcohol use: Not Currently   Drug use: Never    Objective:    Vitals:   08/03/23 1002  BP: 122/78  Pulse: 70  Temp: 36.7 C (98 F)  SpO2: 97%  Weight: 55.6 kg (122 lb 9.6 oz)  Height: 160 cm (5\' 3" )  PainSc:   4  PainLoc: Abdomen    Body mass index is 21.72 kg/m.  Gen: A&Ox3, no distress  Chest: respiratory effort is normal. Abdomen: soft, nondistended, nontender.  No palpable mass or organomegaly. Neuro: no gross deficit Psych: appropriate mood and affect, normal insight/judgment intact  Skin: warm and dry   Labs, Imaging and Diagnostic Testing:  Labs 3/21: CMP and CBC within normal limits  CT 4/30: No acute finding in the abdomen or pelvis.  US  5/21: ULTRASOUND ABDOMEN LIMITED RIGHT UPPER QUADRANT   COMPARISON:  Ultrasound from 11/01/2017.   FINDINGS: Gallbladder:   The gallbladder is physiologically distended. There are several hyperechoic  nonshadowing foci adherent to the gallbladder wall with largest measuring up to 4.6 mm, favored to represent gallbladder polyps. The largest polyp is pedunculated wild other polyps are sessile. No associated abnormal wall thickening. No pericholecystic free fluid. Sonographic Murphy's sign was not elicited.   Common bile duct:   Diameter: Up to 4.9 mm.  No intrahepatic bile duct dilation.   Liver:   No focal lesion identified. Within normal limits in parenchymal echogenicity. Portal vein is patent on color Doppler imaging with normal direction of blood flow towards the liver.    Other: None.   IMPRESSION: *Multiple gallbladder polyps with largest measuring up to 4.6 mm. No routine follow-up is recommended.   Management of Incidentally Detected Gallbladder Polyps: Society of Radiologists in Ultrasound Consensus Conference Recommendations (published online September 07, 2020):   [email or web address]   Extremely low-risk polyps, i.e. pedunculated ball-on-the-wall or thin stalk:   <9 mm: no follow-up   10-14 mm: follow-up at 6, 12 and 24 months   >15 mm: surgical consult   Low-risk polyps, i.e. pedunculated with a thick or wide stalk, or sessile:   ?6 mm: no follow-up   7-9 mm follow-up ultrasound at 12 months   10-14 mm: follow-up ultrasound at 6, 12, 24, and 36 months vs surgical consult   >15 mm: surgical consult   Intermediate risk: focal wall-thickening >24mm adjacent to polyp:   <6 mm: follow-up at 6, 12, 24, 36 months vs surgical consult   >7 mm: surgical consult  Assessment and Plan:  Diagnoses and all orders for this visit:  Biliary colic  Gallbladder polyp  I recommend proceeding with laparoscopic cholecystectomy with possible cholangiogram.  Discussed the relevant anatomy using a diagram to demonstrate, and went over surgical technique.  Discussed risks of surgery including bleeding, infection, pain, scarring, intraabdominal injury specifically to the common bile duct and sequelae, subtotal cholecystectomy, bile leak, conversion to open surgery, failure to resolve symptoms, post-cholecystectomy diarrhea which is typically self-limited, blood clots/ pulmonary embolus, heart attack, pneumonia, stroke, etc. Questions were welcomed and answered to patient's satisfaction.  Patient wishes to proceed with surgery.   Aldon Hung MD FACS

## 2023-08-06 ENCOUNTER — Encounter (HOSPITAL_COMMUNITY): Payer: Self-pay | Admitting: Surgery

## 2023-08-06 ENCOUNTER — Other Ambulatory Visit: Payer: Self-pay

## 2023-08-06 NOTE — Progress Notes (Signed)
 For Anesthesia: PCP - Laneta Pintos, MD  Cardiologist - N/A  Bowel Prep reminder: N/A  Chest x-ray - N/A EKG - N/A Stress Test - N/A ECHO - N/A Cardiac Cath - N/A Pacemaker/ICD device last checked: N/A Pacemaker orders received: N/A Device Rep notified: N/A  Spinal Cord Stimulator: N/A  Sleep Study - N/A CPAP - N/A  Fasting Blood Sugar - 130 Checks Blood Sugar __1___ times a day Date and result of last Hgb A1c- 07/12/23 at 6.1  Last dose of GLP1 agonist- N/A GLP1 instructions: N/A  Last dose of SGLT-2 inhibitors- N/A SGLT-2 instructions: N/A  Blood Thinner Instructions: N/A Aspirin Instructions: N/A Last Dose: N/A  Activity level: Able to exercise without chest pain and/or shortness of breath       Anesthesia review: N/A  Patient denies shortness of breath, fever, cough and chest pain at PAT appointment   Patient verbalized understanding of instructions that were given to them at the PAT appointment. Patient was also instructed that they will need to review over the PAT instructions again at home before surgery.

## 2023-08-08 ENCOUNTER — Other Ambulatory Visit: Payer: Self-pay

## 2023-08-08 ENCOUNTER — Ambulatory Visit (HOSPITAL_COMMUNITY)
Admission: RE | Admit: 2023-08-08 | Discharge: 2023-08-08 | Disposition: A | Discharge status: Home / Self Care | Attending: Surgery | Admitting: Surgery

## 2023-08-08 ENCOUNTER — Encounter (HOSPITAL_COMMUNITY)
Admission: RE | Disposition: A | Discharge status: Home / Self Care | Payer: Self-pay | Source: Home / Self Care | Attending: Surgery

## 2023-08-08 ENCOUNTER — Ambulatory Visit (HOSPITAL_BASED_OUTPATIENT_CLINIC_OR_DEPARTMENT_OTHER): Payer: Self-pay | Admitting: Anesthesiology

## 2023-08-08 ENCOUNTER — Encounter (HOSPITAL_COMMUNITY): Payer: Self-pay | Admitting: Surgery

## 2023-08-08 ENCOUNTER — Ambulatory Visit (HOSPITAL_COMMUNITY): Payer: Self-pay | Admitting: Anesthesiology

## 2023-08-08 DIAGNOSIS — E119 Type 2 diabetes mellitus without complications: Secondary | ICD-10-CM | POA: Insufficient documentation

## 2023-08-08 DIAGNOSIS — Z01818 Encounter for other preprocedural examination: Secondary | ICD-10-CM

## 2023-08-08 DIAGNOSIS — K219 Gastro-esophageal reflux disease without esophagitis: Secondary | ICD-10-CM | POA: Diagnosis not present

## 2023-08-08 DIAGNOSIS — Z7984 Long term (current) use of oral hypoglycemic drugs: Secondary | ICD-10-CM | POA: Insufficient documentation

## 2023-08-08 DIAGNOSIS — K8050 Calculus of bile duct without cholangitis or cholecystitis without obstruction: Secondary | ICD-10-CM | POA: Diagnosis not present

## 2023-08-08 DIAGNOSIS — K824 Cholesterolosis of gallbladder: Secondary | ICD-10-CM | POA: Diagnosis not present

## 2023-08-08 HISTORY — DX: Calculus of gallbladder without cholecystitis without obstruction: K80.20

## 2023-08-08 HISTORY — PX: CHOLECYSTECTOMY: SHX55

## 2023-08-08 LAB — CBC
HCT: 40.3 % (ref 36.0–46.0)
Hemoglobin: 12.7 g/dL (ref 12.0–15.0)
MCH: 30.8 pg (ref 26.0–34.0)
MCHC: 31.5 g/dL (ref 30.0–36.0)
MCV: 97.8 fL (ref 80.0–100.0)
Platelets: 245 10*3/uL (ref 150–400)
RBC: 4.12 MIL/uL (ref 3.87–5.11)
RDW: 11.8 % (ref 11.5–15.5)
WBC: 3.8 10*3/uL — ABNORMAL LOW (ref 4.0–10.5)
nRBC: 0 % (ref 0.0–0.2)

## 2023-08-08 LAB — BASIC METABOLIC PANEL WITH GFR
Anion gap: 13 (ref 5–15)
BUN: 11 mg/dL (ref 6–20)
CO2: 25 mmol/L (ref 22–32)
Calcium: 9.6 mg/dL (ref 8.9–10.3)
Chloride: 100 mmol/L (ref 98–111)
Creatinine, Ser: 0.6 mg/dL (ref 0.44–1.00)
GFR, Estimated: >60 mL/min (ref >60)
Glucose, Bld: 106 mg/dL — ABNORMAL HIGH (ref 70–99)
Potassium: 3.6 mmol/L (ref 3.5–5.1)
Sodium: 138 mmol/L (ref 135–145)

## 2023-08-08 LAB — GLUCOSE, CAPILLARY
Glucose-Capillary: 99 mg/dL (ref 70–99)
Glucose-Capillary: 153 mg/dL — ABNORMAL HIGH (ref 70–99)

## 2023-08-08 LAB — POCT PREGNANCY, URINE: Preg Test, Ur: NEGATIVE

## 2023-08-08 SURGERY — LAPAROSCOPIC CHOLECYSTECTOMY
Anesthesia: General

## 2023-08-08 MED ORDER — CEFAZOLIN SODIUM-DEXTROSE 2-4 GM/100ML-% IV SOLN
2.0000 g | INTRAVENOUS | Status: AC
  Administered 2023-08-08: 2 g via INTRAVENOUS
  Filled 2023-08-08: qty 100

## 2023-08-08 MED ORDER — FENTANYL CITRATE (PF) 100 MCG/2ML IJ SOLN
INTRAMUSCULAR | Status: AC
Start: 2023-08-08 — End: ?
  Filled 2023-08-08: qty 2

## 2023-08-08 MED ORDER — LIDOCAINE HCL (PF) 2 % IJ SOLN
INTRAMUSCULAR | Status: AC
  Filled 2023-08-08: qty 5

## 2023-08-08 MED ORDER — STERILE WATER FOR IRRIGATION IR SOLN
Status: DC | PRN
  Administered 2023-08-08: 1000 mL

## 2023-08-08 MED ORDER — ROCURONIUM BROMIDE 10 MG/ML (PF) SYRINGE
PREFILLED_SYRINGE | INTRAVENOUS | Status: AC
  Filled 2023-08-08: qty 10

## 2023-08-08 MED ORDER — ACETAMINOPHEN 500 MG PO TABS
1000.0000 mg | ORAL_TABLET | ORAL | Status: AC
  Administered 2023-08-08: 1000 mg via ORAL
  Filled 2023-08-08: qty 2

## 2023-08-08 MED ORDER — ONDANSETRON HCL 4 MG/2ML IJ SOLN
INTRAMUSCULAR | Status: DC | PRN
  Administered 2023-08-08: 4 mg via INTRAVENOUS

## 2023-08-08 MED ORDER — MIDAZOLAM HCL 2 MG/2ML IJ SOLN
INTRAMUSCULAR | Status: DC | PRN
Start: 2023-08-08 — End: 2023-08-08
  Administered 2023-08-08: 2 mg via INTRAVENOUS

## 2023-08-08 MED ORDER — OXYCODONE HCL 5 MG/5ML PO SOLN: 5.0000 mg | Freq: Once | ORAL | Status: DC | PRN

## 2023-08-08 MED ORDER — ROCURONIUM BROMIDE 10 MG/ML (PF) SYRINGE
PREFILLED_SYRINGE | INTRAVENOUS | Status: DC | PRN
  Administered 2023-08-08: 40 mg via INTRAVENOUS

## 2023-08-08 MED ORDER — PROPOFOL 10 MG/ML IV BOLUS
INTRAVENOUS | Status: AC
Start: 2023-08-08 — End: ?
  Filled 2023-08-08: qty 20

## 2023-08-08 MED ORDER — CHLORHEXIDINE GLUCONATE 4 % EX SOLN: 60.0000 mL | Freq: Once | CUTANEOUS | Status: DC

## 2023-08-08 MED ORDER — 0.9 % SODIUM CHLORIDE (POUR BTL) OPTIME
TOPICAL | Status: DC | PRN
  Administered 2023-08-08: 1000 mL

## 2023-08-08 MED ORDER — LACTATED RINGERS IV SOLN: INTRAVENOUS | Status: DC

## 2023-08-08 MED ORDER — DOCUSATE SODIUM 100 MG PO CAPS: 100.0000 mg | ORAL_CAPSULE | Freq: Two times a day (BID) | ORAL | 0 refills | Status: AC

## 2023-08-08 MED ORDER — BUPIVACAINE-EPINEPHRINE 0.25% -1:200000 IJ SOLN
INTRAMUSCULAR | Status: DC | PRN
  Administered 2023-08-08: 30 mL

## 2023-08-08 MED ORDER — FENTANYL CITRATE (PF) 100 MCG/2ML IJ SOLN
INTRAMUSCULAR | Status: DC | PRN
  Administered 2023-08-08: 50 ug via INTRAVENOUS
  Administered 2023-08-08: 25 ug via INTRAVENOUS
  Administered 2023-08-08: 50 ug via INTRAVENOUS
  Administered 2023-08-08: 25 ug via INTRAVENOUS

## 2023-08-08 MED ORDER — SUGAMMADEX SODIUM 200 MG/2ML IV SOLN
INTRAVENOUS | Status: DC | PRN
  Administered 2023-08-08: 130 mg via INTRAVENOUS

## 2023-08-08 MED ORDER — BUPIVACAINE-EPINEPHRINE (PF) 0.25% -1:200000 IJ SOLN
INTRAMUSCULAR | Status: AC
  Filled 2023-08-08: qty 30

## 2023-08-08 MED ORDER — AMISULPRIDE (ANTIEMETIC) 5 MG/2ML IV SOLN: 10.0000 mg | Freq: Once | INTRAVENOUS | Status: DC | PRN

## 2023-08-08 MED ORDER — CHLORHEXIDINE GLUCONATE 0.12 % MT SOLN
15.0000 mL | Freq: Once | OROMUCOSAL | Status: AC
  Administered 2023-08-08: 15 mL via OROMUCOSAL

## 2023-08-08 MED ORDER — GABAPENTIN 300 MG PO CAPS
300.0000 mg | ORAL_CAPSULE | ORAL | Status: AC
  Administered 2023-08-08: 300 mg via ORAL
  Filled 2023-08-08: qty 1

## 2023-08-08 MED ORDER — MIDAZOLAM HCL 2 MG/2ML IJ SOLN
INTRAMUSCULAR | Status: AC
  Filled 2023-08-08: qty 2

## 2023-08-08 MED ORDER — ORAL CARE MOUTH RINSE: 15.0000 mL | Freq: Once | OROMUCOSAL | Status: AC

## 2023-08-08 MED ORDER — TRAMADOL HCL 50 MG PO TABS: 50.0000 mg | ORAL_TABLET | Freq: Four times a day (QID) | ORAL | 0 refills | Status: AC | PRN

## 2023-08-08 MED ORDER — FENTANYL CITRATE PF 50 MCG/ML IJ SOSY
PREFILLED_SYRINGE | INTRAMUSCULAR | Status: AC
  Filled 2023-08-08: qty 1

## 2023-08-08 MED ORDER — DEXAMETHASONE SODIUM PHOSPHATE 10 MG/ML IJ SOLN
INTRAMUSCULAR | Status: AC
Start: 2023-08-08 — End: ?
  Filled 2023-08-08: qty 1

## 2023-08-08 MED ORDER — FENTANYL CITRATE PF 50 MCG/ML IJ SOSY
25.0000 ug | PREFILLED_SYRINGE | INTRAMUSCULAR | Status: DC | PRN
  Administered 2023-08-08: 25 ug via INTRAVENOUS

## 2023-08-08 MED ORDER — ONDANSETRON HCL 4 MG/2ML IJ SOLN
INTRAMUSCULAR | Status: AC
  Filled 2023-08-08: qty 2

## 2023-08-08 MED ORDER — PROPOFOL 10 MG/ML IV BOLUS
INTRAVENOUS | Status: DC | PRN
Start: 2023-08-08 — End: 2023-08-08
  Administered 2023-08-08: 100 mg via INTRAVENOUS

## 2023-08-08 MED ORDER — LIDOCAINE HCL (CARDIAC) PF 100 MG/5ML IV SOSY
PREFILLED_SYRINGE | INTRAVENOUS | Status: DC | PRN
Start: 2023-08-08 — End: 2023-08-08
  Administered 2023-08-08: 60 mg via INTRAVENOUS

## 2023-08-08 MED ORDER — BUPIVACAINE LIPOSOME 1.3 % IJ SUSP
INTRAMUSCULAR | Status: AC
  Filled 2023-08-08: qty 20

## 2023-08-08 MED ORDER — INDOCYANINE GREEN 25 MG IV SOLR
1.2500 mg | Freq: Once | INTRAVENOUS | Status: AC
  Administered 2023-08-08: 1.25 mg via INTRAVENOUS
  Filled 2023-08-08: qty 10

## 2023-08-08 MED ORDER — OXYCODONE HCL 5 MG PO TABS: 5.0000 mg | ORAL_TABLET | Freq: Once | ORAL | Status: DC | PRN

## 2023-08-08 MED ORDER — DEXAMETHASONE SODIUM PHOSPHATE 10 MG/ML IJ SOLN
INTRAMUSCULAR | Status: DC | PRN
Start: 2023-08-08 — End: 2023-08-08
  Administered 2023-08-08: 5 mg via INTRAVENOUS

## 2023-08-08 MED ORDER — LACTATED RINGERS IR SOLN
Status: DC | PRN
  Administered 2023-08-08: 1000 mL

## 2023-08-08 SURGICAL SUPPLY — 35 items
BAG COUNTER SPONGE SURGICOUNT (BAG) ×1 IMPLANT
BENZOIN TINCTURE PRP APPL 2/3 (GAUZE/BANDAGES/DRESSINGS) IMPLANT
BNDG ADH 1X3 SHEER STRL LF (GAUZE/BANDAGES/DRESSINGS) IMPLANT
CABLE HIGH FREQUENCY MONO STRZ (ELECTRODE) ×1 IMPLANT
CHLORAPREP W/TINT 26 (MISCELLANEOUS) ×1 IMPLANT
CLIP APPLIE ROT 10 11.4 M/L (STAPLE) ×1 IMPLANT
COVER MAYO STAND XLG (MISCELLANEOUS) IMPLANT
COVER SURGICAL LIGHT HANDLE (MISCELLANEOUS) ×1 IMPLANT
DERMABOND ADVANCED .7 DNX12 (GAUZE/BANDAGES/DRESSINGS) IMPLANT
DRAPE C-ARM 42X120 X-RAY (DRAPES) IMPLANT
ELECT REM PT RETURN 15FT ADLT (MISCELLANEOUS) ×1 IMPLANT
ENDOLOOP SUT PDS II 0 18 (SUTURE) IMPLANT
GLOVE BIO SURGEON STRL SZ 6 (GLOVE) ×1 IMPLANT
GLOVE INDICATOR 6.5 STRL GRN (GLOVE) ×1 IMPLANT
GOWN STRL REUS W/ TWL LRG LVL3 (GOWN DISPOSABLE) ×1 IMPLANT
GRASPER SUT TROCAR 14GX15 (MISCELLANEOUS) ×1 IMPLANT
HEMOSTAT SNOW SURGICEL 2X4 (HEMOSTASIS) IMPLANT
IRRIGATION SUCT STRKRFLW 2 WTP (MISCELLANEOUS) ×1 IMPLANT
KIT BASIN OR (CUSTOM PROCEDURE TRAY) ×1 IMPLANT
KIT TURNOVER KIT A (KITS) IMPLANT
NDL INSUFFLATION 14GA 120MM (NEEDLE) ×1 IMPLANT
NEEDLE INSUFFLATION 14GA 120MM (NEEDLE) ×1 IMPLANT
SCISSORS LAP 5X35 DISP (ENDOMECHANICALS) ×1 IMPLANT
SET CHOLANGIOGRAPH MIX (MISCELLANEOUS) IMPLANT
SET TUBE SMOKE EVAC HIGH FLOW (TUBING) ×1 IMPLANT
SLEEVE Z-THREAD 5X100MM (TROCAR) ×1 IMPLANT
SPIKE FLUID TRANSFER (MISCELLANEOUS) ×1 IMPLANT
STRIP CLOSURE SKIN 1/2X4 (GAUZE/BANDAGES/DRESSINGS) IMPLANT
SUT MNCRL AB 4-0 PS2 18 (SUTURE) ×1 IMPLANT
SYSTEM BAG RETRIEVAL 10MM (BASKET) IMPLANT
TOWEL OR 17X26 10 PK STRL BLUE (TOWEL DISPOSABLE) ×1 IMPLANT
TRAY LAPAROSCOPIC (CUSTOM PROCEDURE TRAY) ×1 IMPLANT
TROCAR ADV FIXATION 12X100MM (TROCAR) ×1 IMPLANT
TROCAR XCEL NON-BLD 5MMX100MML (ENDOMECHANICALS) IMPLANT
TROCAR Z-THREAD OPTICAL 5X100M (TROCAR) ×1 IMPLANT

## 2023-08-08 NOTE — Interval H&P Note (Signed)
 History and Physical Interval Note:  08/08/2023 12:27 PM  Cristina Boyd  has presented today for surgery, with the diagnosis of BILIARY COLIC.  The various methods of treatment have been discussed with the patient and family. After consideration of risks, benefits and other options for treatment, the patient has consented to  Procedure(s) with comments: LAPAROSCOPIC CHOLECYSTECTOMY (N/A) - ICG as a surgical intervention.  The patient's history has been reviewed, patient examined, no change in status, stable for surgery.  I have reviewed the patient's chart and labs.  Questions were answered to the patient's satisfaction.     Avraj Lindroth Loyola Rummage

## 2023-08-08 NOTE — Anesthesia Procedure Notes (Signed)
 Procedure Name: Intubation Date/Time: 08/08/2023 1:31 PM  Performed by: Vella Gey, CRNAPre-anesthesia Checklist: Patient identified, Emergency Drugs available, Suction available and Patient being monitored Patient Re-evaluated:Patient Re-evaluated prior to induction Oxygen Delivery Method: Circle system utilized Preoxygenation: Pre-oxygenation with 100% oxygen Induction Type: IV induction Ventilation: Mask ventilation without difficulty Laryngoscope Size: Miller and 2 Grade View: Grade I Tube type: Oral Tube size: 7.0 mm Number of attempts: 1 Airway Equipment and Method: Stylet Placement Confirmation: ETT inserted through vocal cords under direct vision, positive ETCO2 and breath sounds checked- equal and bilateral Secured at: 20 cm Tube secured with: Tape Dental Injury: Teeth and Oropharynx as per pre-operative assessment

## 2023-08-08 NOTE — Discharge Instructions (Signed)
 LAPAROSCOPIC SURGERY: POST OP INSTRUCTIONS   EAT Gradually transition to your usual diet over the next few days after discharge.  WALK Walk an hour a day (cumulative- not all at once).  Control your pain to do that.    CONTROL PAIN Control pain so that you can walk, sleep, tolerate sneezing/coughing, go up/down stairs.  HAVE A BOWEL MOVEMENT DAILY Keep your bowels regular to avoid problems.  OK to try a laxative to override constipation.  OK to use an antidiarrheal to slow down diarrhea.  Call if not better after 2 tries  CALL IF YOU HAVE PROBLEMS/CONCERNS Call if you are still struggling despite following these instructions. Call if you have concerns not answered by these instructions    DIET: Follow a light bland diet & liquids the first 24 hours after arrival home, such as soup, liquids, starches, etc.  Be sure to drink plenty of fluids.  Quickly advance to a usual solid diet within a few days.  Avoid fast food or heavy meals initially as you are more likely to get nauseated or have irregular bowels.    Take your usually prescribed home medications unless otherwise directed.  PAIN CONTROL: Pain is best controlled by a usual combination of three different methods TOGETHER: Ice/Heat Over the counter pain medication Prescription pain medication Most patients will experience some swelling and bruising around the incisions.  Ice packs or heating pads (30-60 minutes up to 6 times a day) will help. Use ice for the first few days to help decrease swelling and bruising, then switch to heat to help relax tight/sore spots and speed recovery.  Some people prefer to use ice alone, heat alone, alternating between ice & heat.  Experiment to what works for you.  Swelling and bruising can take several weeks to resolve.   It is helpful to take an over-the-counter pain medication regularly for the first few days: Naproxen  (Aleve , etc)  Two 220mg  tabs twice a day OR Ibuprofen (Advil, etc) Three 200mg   tabs four times a day (every meal & bedtime) AND Acetaminophen  (Tylenol , etc) 500-650mg  four times a day (every meal & bedtime) A  prescription for pain medication (such as oxycodone , hydrocodone , tramadol, gabapentin , methocarbamol , etc) should be given to you upon discharge.  Take your pain medication as prescribed, IF NEEDED.  If you are having problems/concerns with the prescription medicine (does not control pain, nausea, vomiting, rash, itching, etc), please call us  [phone] to see if we need to switch you to a different pain medicine that will work better for you and/or control your side effect better. If you need a refill on your pain medication, please give us  48 hour notice.  contact your pharmacy.  They will contact our office to request authorization. Prescriptions will not be filled after 5 pm or on week-ends  Avoid getting constipated.   Between the surgery and the pain medications, it is common to experience some constipation.   Increasing fluid intake and taking a fiber supplement (such as Metamucil, Citrucel, FiberCon, MiraLax , etc) 1-2 times a day regularly will usually help prevent this problem from occurring.   A mild laxative (prune juice, Milk of Magnesia, MiraLax , etc) should be taken according to package directions if there are no bowel movements after 48 hours.   Watch out for diarrhea.   If you have many loose bowel movements, simplify your diet to bland foods & liquids for a few days.   Stop any stool softeners and decrease your fiber supplement.  Switching to mild anti-diarrheal medications (Kayopectate, Pepto Bismol) can help.   If this worsens or does not improve, please call us .  Wash / shower every day.  You may shower over the skin glue which is waterproof.  Do not soak or submerge incisions.  No rubbing, scrubbing, lotions or ointments to incisions.  Glue will flake off after about 2 weeks.  You may leave the incision open to air.  You may replace a  dressing/Band-Aid to cover the incision for comfort if you wish.   ACTIVITIES as tolerated:   You may resume regular (light) daily activities beginning the next day--such as daily self-care, walking, climbing stairs--gradually increasing activities as tolerated.  If you can walk 30 minutes without difficulty, it is safe to try more intense activity such as jogging, treadmill, bicycling, low-impact aerobics, swimming, etc. Save the most intensive and strenuous activity for last such as sit-ups, heavy lifting, contact sports, etc  Refrain from any heavy lifting or straining until you are off narcotics for pain control.   DO NOT PUSH THROUGH PAIN.  Let pain be your guide: If it hurts to do something, don't do it.  Pain is your body warning you to avoid that activity for another week until the pain goes down. You may drive when you are no longer taking prescription pain medication, you can comfortably wear a seatbelt, and you can safely maneuver your car and apply brakes. You may have sexual intercourse when it is comfortable.  FOLLOW UP in our office Please call [facility] at [phone] to set up an appointment to see your surgeon in the office for a follow-up appointment approximately 2-3 weeks after your surgery. Make sure that you call for this appointment the day you arrive home to insure a convenient appointment time.  10. IF YOU HAVE DISABILITY OR FAMILY LEAVE FORMS, BRING THEM TO THE OFFICE FOR PROCESSING.  DO NOT GIVE THEM TO YOUR DOCTOR.   WHEN TO CALL US  [phone]: Poor pain control Reactions / problems with new medications (rash/itching, nausea, etc)  Fever over 101.5 F (38.5 C) Inability to urinate Nausea and/or vomiting Worsening swelling or bruising Continued bleeding from incision. Increased pain, redness, or drainage from the incision   The clinic staff is available to answer your questions during regular business hours (8:30am-5pm).  Please don't hesitate to call and ask to  speak to one of our nurses for clinical concerns.   If you have a medical emergency, go to the nearest emergency room or call 911.  A surgeon from [facility] is always on call at the [facility], [place] [address], [address], [place], [place]  [address] ? MAIN: [phone] ? TOLL FREE: [phone] ?  FAX [phone] [email or web address]

## 2023-08-08 NOTE — Op Note (Addendum)
 Operative Note  Cristina Boyd 53 y.o. female [id number]  08/08/2023  Surgeon: Adalberto Acton MD FACS  Procedure performed: Laparoscopic Cholecystectomy with near infrared fluorescent cholangiography, biopsy of abdominal wall peritoneum  Preop diagnosis: Gallbladder polyps, biliary colic Post-op diagnosis/intraop findings: same, multiple soft, white deposits on the right upper quadrant abdominal wall as well as on the antrum of the stomach, see photos.  Moderately dilated stomach without overtly palpable mass.  Specimens: gallbladder, abdominal wall biopsy  Retained items: none  EBL: minimal  Complications: none  Description of procedure: After confirming informed consent the patient was brought to the operating room. Antibiotics were administered. SCD's were applied. General endotracheal anesthesia was initiated and a formal time-out was performed. The abdomen was prepped and draped in the usual sterile fashion and the abdomen was entered using an infraumbilical veress needle after instilling the site with local. Insufflation to was obtained, 5mm trocar and camera inserted, and gross inspection revealed no evidence of injury from our entry.  Small white soft deposits were noted on the right upper quadrant abdominal wall as well as a few on the anterior surface of the distal stomach.  The stomach was somewhat dilated; this was palpated and there was no appreciable mass grossly.  Of note, the patient did have a noncontrasted CT scan 1 month ago that makes no mention of any gastric abnormalities but on my review does note moderate amount of retained material in the stomach and no obvious mass or thickening.  Two 5mm trocars were introduced in the right midclavicular and right anterior axillary lines under direct visualization and following infiltration with local. A 12mm trocar was placed in the epigastrium. The gallbladder fundus was retracted cephalad and the infundibulum was retracted  laterally. A combination of hook electrocautery and blunt dissection was utilized to clear the peritoneum from the neck and cystic duct, circumferentially isolating the cystic artery and cystic duct and lifting the gallbladder from the cystic plate. The critical view of safety was achieved with the cystic artery, cystic duct, and liver bed visualized between them with no other structures.  Near infrared fluorescent cholangiography was activated which demonstrated concordant anatomy and also illuminated the common bile duct medially which was confirmed to be well away from the area of dissection.  The cystic artery was clipped with a single clip proximally and distally and divided as was the cystic duct with four clips on the proximal end. The gallbladder was dissected from the liver plate using electrocautery. Once freed the gallbladder was placed in an endocatch bag and removed intact through the epigastric trocar site. A small amount of bleeding on the liver bed was controlled with cautery. Hemostasis was once again confirmed, and reinspection of the abdomen revealed no injuries. The clips were well apposed without any bile leak from the ligated remnant cystic duct or the liver bed either on direct inspection nor on near infrared fluorescent cholangiography.  At this point a small section of parietal peritoneum in the right upper quadrant where the small soft white tissue deposits had been noted was excised and sent for biopsy.  Hemostasis here was ensured with cautery. The 12mm trocar site in the epigastrium was closed with a 0 vicryl in the fascia under direct visualization using a PMI device. The abdomen was desufflated and all trocars removed. The skin incisions were closed with subcuticular 4-0 monocryl and Dermabond. The patient was awakened, extubated and transported to the recovery room in stable condition.    All counts were  correct at the completion of the case.

## 2023-08-08 NOTE — Anesthesia Preprocedure Evaluation (Addendum)
 Anesthesia Evaluation  Patient identified by MRN, date of birth, ID band Patient awake    Reviewed: Allergy & Precautions, NPO status , Patient's Chart, lab work & pertinent test results  Airway Mallampati: II  TM Distance: >3 FB Neck ROM: Full    Dental  (+) Teeth Intact, Dental Advisory Given, Implants   Pulmonary neg pulmonary ROS   Pulmonary exam normal breath sounds clear to auscultation       Cardiovascular negative cardio ROS Normal cardiovascular exam Rhythm:Regular Rate:Normal     Neuro/Psych  PSYCHIATRIC DISORDERS Anxiety     negative neurological ROS     GI/Hepatic Neg liver ROS,GERD  ,,  Endo/Other  diabetes, Type 2, Oral Hypoglycemic Agents    Renal/GU negative Renal ROS  negative genitourinary   Musculoskeletal  (+) Arthritis ,    Abdominal   Peds  Hematology negative hematology ROS (+)   Anesthesia Other Findings   Reproductive/Obstetrics                             Anesthesia Physical Anesthesia Plan  ASA: 2  Anesthesia Plan: General   Post-op Pain Management: Tylenol  PO (pre-op)*   Induction: Intravenous  PONV Risk Score and Plan: 3 and Midazolam, Dexamethasone and Ondansetron   Airway Management Planned: Oral ETT  Additional Equipment:   Intra-op Plan:   Post-operative Plan: Extubation in OR  Informed Consent: I have reviewed the patients History and Physical, chart, labs and discussed the procedure including the risks, benefits and alternatives for the proposed anesthesia with the patient or authorized representative who has indicated his/her understanding and acceptance.     Dental advisory given  Plan Discussed with: CRNA  Anesthesia Plan Comments:        Anesthesia Quick Evaluation

## 2023-08-08 NOTE — Transfer of Care (Signed)
 Immediate Anesthesia Transfer of Care Note  Patient: Cristina Boyd  Procedure(s) Performed: LAPAROSCOPIC CHOLECYSTECTOMY  Patient Location: PACU  Anesthesia Type:General  Level of Consciousness: awake, alert , and oriented  Airway & Oxygen Therapy: Patient Spontanous Breathing and Patient connected to nasal cannula oxygen  Post-op Assessment: Report given to RN and Post -op Vital signs reviewed and stable  Post vital signs: Reviewed and stable  Last Vitals:  Vitals Value Taken Time  BP 114/99 08/08/23 1503  Temp    Pulse 66 08/08/23 1503  Resp 16 08/08/23 1503  SpO2 100 % 08/08/23 1503  Vitals shown include unfiled device data.  Last Pain:  Vitals:   08/08/23 1249  TempSrc: Oral  PainSc:       Patients Stated Pain Goal: 4 (08/06/23 1133)  Complications: No notable events documented.

## 2023-08-09 ENCOUNTER — Encounter (HOSPITAL_COMMUNITY): Payer: Self-pay | Admitting: Surgery

## 2023-08-09 LAB — SURGICAL PATHOLOGY

## 2023-08-09 NOTE — Anesthesia Postprocedure Evaluation (Signed)
 Anesthesia Post Note  Patient: Cristina Boyd  Procedure(s) Performed: LAPAROSCOPIC CHOLECYSTECTOMY     Patient location during evaluation: PACU Anesthesia Type: General Level of consciousness: awake and alert Pain management: pain level controlled Vital Signs Assessment: post-procedure vital signs reviewed and stable Respiratory status: spontaneous breathing, nonlabored ventilation, respiratory function stable and patient connected to nasal cannula oxygen Cardiovascular status: blood pressure returned to baseline and stable Postop Assessment: no apparent nausea or vomiting Anesthetic complications: no  No notable events documented.  Last Vitals:  Vitals:   08/08/23 1557 08/08/23 1600  BP: 120/71 109/71  Pulse: 70 61  Resp: 17 14  Temp: 36.8 C   SpO2: 100% 100%    Last Pain:  Vitals:   08/08/23 1600  TempSrc:   PainSc: 3                  Derrin Currey L Trista Ciocca

## 2023-08-13 ENCOUNTER — Ambulatory Visit: Payer: Self-pay | Admitting: Family Medicine

## 2023-08-27 ENCOUNTER — Encounter: Payer: Self-pay | Admitting: Family Medicine

## 2023-08-28 ENCOUNTER — Other Ambulatory Visit: Payer: Self-pay

## 2023-09-03 ENCOUNTER — Other Ambulatory Visit: Payer: Self-pay | Admitting: Family Medicine

## 2023-09-03 MED ORDER — FREESTYLE LIBRE 3 SENSOR MISC: 2 refills | Status: AC

## 2023-09-06 ENCOUNTER — Emergency Department (HOSPITAL_BASED_OUTPATIENT_CLINIC_OR_DEPARTMENT_OTHER)

## 2023-09-06 ENCOUNTER — Encounter (HOSPITAL_BASED_OUTPATIENT_CLINIC_OR_DEPARTMENT_OTHER): Payer: Self-pay | Admitting: Emergency Medicine

## 2023-09-06 ENCOUNTER — Other Ambulatory Visit: Payer: Self-pay

## 2023-09-06 ENCOUNTER — Emergency Department (HOSPITAL_BASED_OUTPATIENT_CLINIC_OR_DEPARTMENT_OTHER)
Admission: EM | Admit: 2023-09-06 | Discharge: 2023-09-06 | Disposition: A | Discharge status: Home / Self Care | Attending: Emergency Medicine | Admitting: Emergency Medicine

## 2023-09-06 ENCOUNTER — Other Ambulatory Visit: Payer: Self-pay | Admitting: Family Medicine

## 2023-09-06 DIAGNOSIS — E119 Type 2 diabetes mellitus without complications: Secondary | ICD-10-CM | POA: Diagnosis not present

## 2023-09-06 DIAGNOSIS — R0602 Shortness of breath: Secondary | ICD-10-CM | POA: Insufficient documentation

## 2023-09-06 DIAGNOSIS — Z7984 Long term (current) use of oral hypoglycemic drugs: Secondary | ICD-10-CM | POA: Insufficient documentation

## 2023-09-06 DIAGNOSIS — R109 Unspecified abdominal pain: Secondary | ICD-10-CM | POA: Insufficient documentation

## 2023-09-06 DIAGNOSIS — R079 Chest pain, unspecified: Secondary | ICD-10-CM | POA: Insufficient documentation

## 2023-09-06 LAB — COMPREHENSIVE METABOLIC PANEL WITH GFR
ALT: 49 U/L — ABNORMAL HIGH (ref 0–44)
AST: 35 U/L (ref 15–41)
Albumin: 4.9 g/dL (ref 3.5–5.0)
Alkaline Phosphatase: 92 U/L (ref 38–126)
Anion gap: 12 (ref 5–15)
BUN: 11 mg/dL (ref 6–20)
CO2: 27 mmol/L (ref 22–32)
Calcium: 10 mg/dL (ref 8.9–10.3)
Chloride: 104 mmol/L (ref 98–111)
Creatinine, Ser: 0.55 mg/dL (ref 0.44–1.00)
GFR, Estimated: >60 mL/min (ref >60)
Glucose, Bld: 115 mg/dL — ABNORMAL HIGH (ref 70–99)
Potassium: 4 mmol/L (ref 3.5–5.1)
Sodium: 143 mmol/L (ref 135–145)
Total Bilirubin: 0.4 mg/dL (ref 0.0–1.2)
Total Protein: 7.9 g/dL (ref 6.5–8.1)

## 2023-09-06 LAB — CBC
HCT: 41.9 % (ref 36.0–46.0)
Hemoglobin: 13.6 g/dL (ref 12.0–15.0)
MCH: 30.5 pg (ref 26.0–34.0)
MCHC: 32.5 g/dL (ref 30.0–36.0)
MCV: 93.9 fL (ref 80.0–100.0)
Platelets: 250 10*3/uL (ref 150–400)
RBC: 4.46 MIL/uL (ref 3.87–5.11)
RDW: 11.8 % (ref 11.5–15.5)
WBC: 4.2 10*3/uL (ref 4.0–10.5)
nRBC: 0 % (ref 0.0–0.2)

## 2023-09-06 LAB — LIPASE, BLOOD: Lipase: 30 U/L (ref 11–51)

## 2023-09-06 LAB — D-DIMER, QUANTITATIVE: D-Dimer, Quant: 0.31 ug{FEU}/mL (ref 0.00–0.50)

## 2023-09-06 LAB — TROPONIN T, HIGH SENSITIVITY
Troponin T High Sensitivity: <15 ng/L (ref <19)
Troponin T High Sensitivity: <15 ng/L (ref <19)

## 2023-09-06 NOTE — Discharge Instructions (Signed)
 Please read and follow all provided instructions.  Your diagnoses today include:  1. Shortness of breath   2. Right flank pain     Tests performed today include: An EKG of your heart: Was normal A chest x-ray: Was normal, no pneumonia or other problems in the lungs Cardiac enzymes - a blood test for heart muscle damage, were both normal Blood counts and electrolytes: Blood sugar is 115 but otherwise no problems D-dimer: Screening test for blood clot was normal Liver enzymes: Were normal Lipase: Test for pancreas was normal Vital signs. See below for your results today.   Medications prescribed:  None  Take any prescribed medications only as directed.  Follow-up instructions: Please follow-up with your primary care provider next week for further evaluation of your symptoms.   Return instructions:  SEEK IMMEDIATE MEDICAL ATTENTION IF: You have severe chest pain, especially if the pain is crushing or pressure-like and spreads to the arms, back, neck, or jaw, or if you have sweating, nausea or vomiting, or trouble with breathing. THIS IS AN EMERGENCY. Do not wait to see if the pain will go away. Get medical help at once. Call 911. DO NOT drive yourself to the hospital.  Your chest pain gets worse and does not go away after a few minutes of rest.  You have an attack of chest pain lasting longer than what you usually experience.  You have significant dizziness, if you pass out, or have trouble walking.  You have chest pain not typical of your usual pain for which you originally saw your caregiver.  You have any other emergent concerns regarding your health.  Additional Information: Chest pain and shortness of breath come from many different causes. Your caregiver has diagnosed you as having chest pain that is not specific for one problem, but does not require admission.  You are at low risk for an acute heart condition or other serious illness.   Your vital signs today were: BP 108/74    Pulse (!) 59   Temp 97.7 F (36.5 C) (Oral)   Resp 12   Ht 5' 4 (1.626 m)   Wt 52.6 kg   SpO2 99%   BMI 19.91 kg/m  If your blood pressure (BP) was elevated above 135/85 this visit, please have this repeated by your doctor within one month. --------------

## 2023-09-06 NOTE — ED Triage Notes (Signed)
 Right sided arm pain, right sided back pain and sob for several days.  Worse last night.  No recent  injury or travel.  No known fever.  Some hot flashes.

## 2023-09-06 NOTE — ED Notes (Signed)
 Attempted to print [facility] labels- unable too

## 2023-09-06 NOTE — ED Notes (Signed)
 Ambulated on room air HR 78, SpO2 _98__, RR _19 at start. Ambulated approximately  300 feet, HR _90__, SpO2 100_, RR 20.

## 2023-09-06 NOTE — ED Notes (Signed)
 Pt stated, I'm not in pain

## 2023-09-06 NOTE — ED Provider Notes (Signed)
 [place] [facility] Provider Note   CSN: [id number] Arrival date & time: 09/06/23  1001     Patient presents with: Back Pain, Arm Pain, and Shortness of Breath   Cristina Boyd is a 53 y.o. female.   Patient with history of chronic right-sided flank and abdominal pain stating since about 2020, presents today with continued pain and shortness of breath.  Patient has had worsening symptoms over the past several months.  She had cholecystectomy on 08/08/2023 due to gallbladder polyp and this pain.  Patient states that she continues to have intermittent right-sided pain.  It radiates into her right shoulder and arm at times.  She reports burning sensation in the hands bilaterally.  Family member at bedside who interprets with permission of the patient.  Confirms that the right-sided pain symptoms are longstanding however the shortness of breath seems to be worse over the past 2 days.  Patient is able to lie flat to sleep.  She denies swelling of the lower extremities.  She denies fever or cough.  She has a history of diabetes is not on metformin  and high cholesterol.  No personal history of heart problems.  No family history of heart problems that she reports.  She does not smoke or use tobacco.       Prior to Admission medications   Medication Sig Start Date End Date Taking? Authorizing Provider  Continuous Glucose Sensor (FREESTYLE LIBRE 3 SENSOR) MISC Apply to upper arm to monitor blood glucose.  Replace every 14 days. 09/03/23   Chandra Toribio POUR, MD  citalopram  (CELEXA ) 10 MG tablet Take 1 tablet (10 mg total) by mouth daily. 07/12/23   Chandra Toribio POUR, MD  docusate sodium  (COLACE) 100 MG capsule Take 1 capsule (100 mg total) by mouth 2 (two) times daily. Okay to decrease to once daily or stop taking if having loose bowel movements 08/08/23 09/07/23  Signe Mitzie LABOR, MD  metFORMIN  (GLUCOPHAGE -XR) 500 MG 24 hr tablet TAKE 1 TABLET(500 MG) BY MOUTH DAILY WITH  BREAKFAST 09/06/23   Chandra Toribio POUR, MD    Allergies: Patient has no known allergies.    Review of Systems  Updated Vital Signs BP 116/77 (BP Location: Left Arm)   Pulse 68   Temp 97.7 F (36.5 C) (Oral)   Resp 17   Ht 5' 4 (1.626 m)   Wt 52.6 kg   SpO2 99%   BMI 19.91 kg/m   Physical Exam Vitals and nursing note reviewed.  Constitutional:      Appearance: She is well-developed. She is not diaphoretic.  HENT:     Head: Normocephalic and atraumatic.     Mouth/Throat:     Mouth: Mucous membranes are not dry.  Eyes:     Conjunctiva/sclera: Conjunctivae normal.  Neck:     Vascular: Normal carotid pulses. No JVD.     Trachea: Trachea normal. No tracheal deviation.  Cardiovascular:     Rate and Rhythm: Normal rate and regular rhythm.     Pulses: No decreased pulses.          Radial pulses are 2+ on the right side and 2+ on the left side.     Heart sounds: Normal heart sounds, S1 normal and S2 normal. No murmur heard. Pulmonary:     Effort: Pulmonary effort is normal. No respiratory distress.     Breath sounds: No wheezing.  Chest:     Chest wall: Tenderness present.     Comments: Patient  seems to be mildly tender over the right lateral chest wall, beneath the axilla.  No rashes or signs of trauma noted. Abdominal:     General: Bowel sounds are normal.     Palpations: Abdomen is soft.     Tenderness: There is no abdominal tenderness. There is no guarding or rebound.     Comments: No abdominal tenderness at time of exam  Musculoskeletal:        General: Normal range of motion.     Cervical back: Normal range of motion and neck supple. No muscular tenderness.  Skin:    General: Skin is warm and dry.     Coloration: Skin is not pale.  Neurological:     Mental Status: She is alert.     (all labs ordered are listed, but only abnormal results are displayed) Labs Reviewed  COMPREHENSIVE METABOLIC PANEL WITH GFR - Abnormal; Notable for the following components:       Result Value   Glucose, Bld 115 (*)    ALT 49 (*)    All other components within normal limits  CBC  D-DIMER, QUANTITATIVE  LIPASE, BLOOD  TROPONIN T, HIGH SENSITIVITY  TROPONIN T, HIGH SENSITIVITY    ED ECG REPORT   Date: 09/06/2023  Rate: 68  Rhythm: normal sinus rhythm  QRS Axis: normal  Intervals: normal  ST/T Wave abnormalities: normal  Conduction Disutrbances:none  Narrative Interpretation:   Old EKG Reviewed: changes noted, nonspecific changes resolved from 2018  I have personally reviewed the EKG tracing and agree with the computerized printout as noted.   Radiology: DG Chest 2 View Result Date: 09/06/2023 CLINICAL DATA:  Shortness of breath. EXAM: CHEST - 2 VIEW COMPARISON:  12/21/2016. FINDINGS: The heart size and mediastinal contours are within normal limits. Both lungs are clear. No pleural effusion or pneumothorax. No acute osseous abnormality. IMPRESSION: No acute cardiopulmonary findings. Electronically Signed   By: Harrietta Sherry M.D.   On: 09/06/2023 13:16     Procedures   Medications Ordered in the ED - No data to display   ED Course  Patient seen and examined. History obtained directly from patient and family member.  Labs/EKG: Ordered CBC, CMP, lipase, troponin, D-dimer.  Imaging: Ordered chest xray.  Medications/Fluids: None  Most recent vital signs reviewed and are as follows: BP 116/77 (BP Location: Left Arm)   Pulse 68   Temp 97.7 F (36.5 C) (Oral)   Resp 17   Ht 5' 4 (1.626 m)   Wt 52.6 kg   SpO2 99%   BMI 19.91 kg/m   Initial impression: Right sided chest pain, chronic, more shortness of breath over the past few days which is atypical for her usual symptoms.  2:45 PM Reassessment performed. Patient appears stable, daughter now at bedside.  Labs personally reviewed and interpreted including: CBC normal; CMP blood sugar slightly elevated otherwise unremarkable; lipase normal; troponin negative x 2; D-dimer was negative.    Imaging personally visualized and interpreted including: Chest x-ray agree negative.  Reviewed pertinent lab work and imaging with patient at bedside. Questions answered.   Most current vital signs reviewed and are as follows: BP 108/74   Pulse (!) 59   Temp 97.7 F (36.5 C) (Oral)   Resp 12   Ht 5' 4 (1.626 m)   Wt 52.6 kg   SpO2 99%   BMI 19.91 kg/m   Plan: Discharge to home.   Prescriptions written for: None  Other home care instructions discussed: Bland diet, avoidance  of triggers  ED return instructions discussed: The patient was urged to return to the Emergency Department immediately with worsening of current symptoms, worsening abdominal pain, persistent vomiting, blood noted in stools, fever, or any other concerns.  Encouraged to return with persistent chest pain, shortness of breath, new or worsening symptoms.  The patient verbalized understanding.   Follow-up instructions discussed: Patient encouraged to follow-up with their PCP in 5 days.                                     Medical Decision Making Amount and/or Complexity of Data Reviewed Labs: ordered. Radiology: ordered.   For this patient's complaint of chest pain, the following emergent conditions were considered on the differential diagnosis: acute coronary syndrome, pulmonary embolism, pneumothorax, myocarditis, pericardial tamponade, aortic dissection, thoracic aortic aneurysm complication, esophageal perforation.   Other causes were also considered including: gastroesophageal reflux disease, musculoskeletal pain including costochondritis, pneumonia/pleurisy, herpes zoster, pericarditis.  In regards to possibility of ACS, patient has atypical features of pain, non-ischemic and unchanged EKG and negative troponin(s). Heart score was calculated to be 3.   In regards to possibility of PE, symptoms are atypical for PE and risk profile is low, D-dimer was negative.  Patient's chest and side pain seem to  be acute on chronic in nature.  Lab workup including LFTs and lipase were reassuring today.  Shortness of breath has recently worsened.  Patient is in no distress.  Lungs are clear.  Chest x-ray negative.  Patient ambulated in the emergency department without hypoxia or any difficulties.  Unclear etiology of the symptoms today but no emergent findings found on exam.  No indications for admission at this time.  Patient looks well and has appropriate outpatient follow-up.  Encourage PCP follow-up next week.  The patient's vital signs, pertinent lab work and imaging were reviewed and interpreted as discussed in the ED course. Hospitalization was considered for further testing, treatments, or serial exams/observation. However as patient is well-appearing, has a stable exam, and reassuring studies today, I do not feel that they warrant admission at this time. This plan was discussed with the patient who verbalizes agreement and comfort with this plan and seems reliable and able to return to the Emergency Department with worsening or changing symptoms.       Final diagnoses:  Shortness of breath  Right flank pain    ED Discharge Orders     None          Desiderio Chew, PA-C 09/06/23 1448    Freddi Hamilton, MD 09/17/23 0700

## 2023-09-12 ENCOUNTER — Encounter: Payer: Self-pay | Admitting: Family Medicine

## 2023-09-12 ENCOUNTER — Ambulatory Visit (INDEPENDENT_AMBULATORY_CARE_PROVIDER_SITE_OTHER): Admitting: Family Medicine

## 2023-09-12 VITALS — BP 104/67 | HR 54 | Ht 64.0 in | Wt 118.0 lb

## 2023-09-12 DIAGNOSIS — N951 Menopausal and female climacteric states: Secondary | ICD-10-CM | POA: Diagnosis not present

## 2023-09-12 DIAGNOSIS — R7303 Prediabetes: Secondary | ICD-10-CM | POA: Diagnosis not present

## 2023-09-12 DIAGNOSIS — R202 Paresthesia of skin: Secondary | ICD-10-CM

## 2023-09-12 DIAGNOSIS — K824 Cholesterolosis of gallbladder: Secondary | ICD-10-CM

## 2023-09-12 DIAGNOSIS — G479 Sleep disorder, unspecified: Secondary | ICD-10-CM

## 2023-09-12 DIAGNOSIS — E7800 Pure hypercholesterolemia, unspecified: Secondary | ICD-10-CM

## 2023-09-12 NOTE — Assessment & Plan Note (Signed)
 Pt states these symptoms have improved.  Is taking metformin  now, but A1c was never excessively high so it is uncertain if the improvement is related to the metformin .

## 2023-09-12 NOTE — Assessment & Plan Note (Signed)
 Pt states she continues to take the escitalopram nightly 10mg .  No issues or concerns with medication.

## 2023-09-12 NOTE — Assessment & Plan Note (Signed)
 Pt taking metformin  for prediabetes.  Would like to increase the dose if possible.  Advised her that we will need to check A1c first next month prior to deciding on whether to change her medication or not.

## 2023-09-12 NOTE — Progress Notes (Signed)
 Established Patient Office Visit  Subjective   Patient ID: Cristina Boyd, female    DOB: 1970/10/15  Age: 53 y.o. MRN: [id number]  Chief Complaint  Patient presents with  . Hospitalization Follow-up    HPI  Overall the patient states that her right upper quadrant abdominal pain is still present.  Pain was worse last week but better this week.  She states that compared to precholecystectomy her pain is overall better.  Patient states her neuropathy symptoms are improving.  Patient still taking her escitalopram and metformin .  Wants to know if she can increase her metformin  dose.  We discussed rechecking her A1c in a month.  Patient has not recently followed up with her gynecologist.  Patient has issues with waking up early.  Typically goes to bed around 11 PM, sometimes wakes up around 3 AM.  Has no issues falling asleep.  Has excellent sleep hygiene.  Does relaxation techniques and listens to meditating prior to going to bed.  Has tried melatonin in the past but not regularly.     The 10-year ASCVD risk score (Arnett DK, et al., 2019) is: 0.9%  Health Maintenance Due  Topic Date Due  . Diabetic kidney evaluation - Urine ACR  Never done  . Hepatitis B Vaccines (1 of 3 - 19+ 3-dose series) Never done  . Zoster Vaccines- Shingrix (1 of 2) Never done  . COVID-19 Vaccine (2 - 2024-25 season) 11/05/2022      Objective:     BP 104/67   Pulse (!) 54   Ht 5' 4 (1.626 m)   Wt 118 lb (53.5 kg)   SpO2 100%   BMI 20.25 kg/m    Physical Exam General: Alert, oriented CV: Regular Pulmonary: Lungs are bilaterally MSK: No pain with lateral vertebral flexion or rotation.  Minimally tender to palpation over the 10th/11th ribs on the right side. GI: Normal bowel sounds.  Nontender to palpation.  No masses.  Surgical laparoscopy scars healing well.   No results found for any visits on 09/12/23.      Assessment & Plan:   Gallbladder polyp Assessment & Plan: Pt had  cholecystectomy last month for RUQ pain and gallbladder polyps.  Polyps benign on pathology.  Pt states overall pain is better but still has some RUQ pain.  Pt has appt with her gastroenterologist next month.  Was advised to discuss with them the surgery and continued RUQ and whether or not further GI workup is necessary.  Has had EGD and colonoscopy in the past.      Perimenopausal vasomotor symptoms Assessment & Plan: Pt states she continues to take the escitalopram nightly 10mg .  No issues or concerns with medication.    Paresthesia Assessment & Plan: Pt states these symptoms have improved.  Is taking metformin  now, but A1c was never excessively high so it is uncertain if the improvement is related to the metformin .    Prediabetes Assessment & Plan: Pt taking metformin  for prediabetes.  Would like to increase the dose if possible.  Advised her that we will need to check A1c first next month prior to deciding on whether to change her medication or not.    Sleep difficulties Assessment & Plan: Has issues with early nighttime awakening and difficulty falling back asleep.  Has excellent sleep hygiene.  Already doing all behavioral changes I recommended.  Has taken melatonin sporadically in the past.  Advised her to take melatonin regularly. Start at 5mg  and can decrease if causing too much  morning drowsiness. F/u next visit.       Return in about 3 months (around 12/13/2023) for ruq pain, sleep.    Toribio MARLA Slain, MD

## 2023-09-12 NOTE — Patient Instructions (Signed)
 It was nice to see you today,  We addressed the following topics today: -Continue taking the same dose of metformin  until we get your A1c next month - When you see your gastroenterologist I want you to tell them that your right upper quadrant pain, your recent surgery, and ask if your current pain could be related to anything gastrointestinal. - Depending on what your gastroenterologist says, we may then have you see your gynecologist. - For help with sleep, I would like you to take the melatonin every night.  You can take 5 mg or you can cut it in half and take 2.5 to 5 mg is too much. - If this does not help we can discuss other medication options at your next visit.  Have a great day,  Rolan Slain, MD

## 2023-09-12 NOTE — Assessment & Plan Note (Signed)
 Pt had cholecystectomy last month for RUQ pain and gallbladder polyps.  Polyps benign on pathology.  Pt states overall pain is better but still has some RUQ pain.  Pt has appt with her gastroenterologist next month.  Was advised to discuss with them the surgery and continued RUQ and whether or not further GI workup is necessary.  Has had EGD and colonoscopy in the past.

## 2023-09-12 NOTE — Assessment & Plan Note (Signed)
 Has issues with early nighttime awakening and difficulty falling back asleep.  Has excellent sleep hygiene.  Already doing all behavioral changes I recommended.  Has taken melatonin sporadically in the past.  Advised her to take melatonin regularly. Start at 5mg  and can decrease if causing too much morning drowsiness. F/u next visit.

## 2023-09-20 ENCOUNTER — Ambulatory Visit

## 2023-10-12 ENCOUNTER — Ambulatory Visit: Admitting: Family Medicine

## 2023-10-15 NOTE — Progress Notes (Signed)
 [name] [id number] Jul 06, 1970   Chief Complaint: RUQ abdominal pain  Referring Provider: Signe Mitzie LABOR, MD Primary GI MD: Dr. Shila  HPI: Cristina Boyd is a 53 y.o. female with past medical history of GERD, RUQ abdominal pain s/p cholecystectomy who presents today to discuss EGD.    Last seen in office 01/2023 by Dr. Nandigam at which time her GERD symptoms had improved with occasional late night exacerbations.  History of gastritis on endoscopy in 2019, no H. pylori.  Discussed the option of repeat endoscopy if symptoms persisted and she was to continue daily omeprazole  and famotidine  in the evening.  Seen by PCP 09/12/2023.  Noted to have had cholecystectomy the month prior for RUQ pain and gallbladder polyps.  Polyps benign on pathology.  Pain overall better but still has some RUQ pain.  Per postop diagnosis/IntraOp findings from [facility] surgery on laparoscopic cholecystectomy: multiple soft, white deposits on the RUQ abdominal wall as well as on the antrum of the stomach.  Moderately dilated stomach without overtly palpable mass.   Patient speaks [place] and an interpreter is present for the visit today.  Patient states she has been having RUQ abdominal pain which is better since having cholecystectomy but not resolved.  Pain occurs daily, sometimes wakes her up at night, and is worse in the evening.  Localized to RUQ with occasional radiation to her back.  Worse when she sits in a squatted position on the floor, and better if she stands up.  Denies any pain currently.  She denies any nausea, vomiting, fever, chills.  Has been having intermittent acid reflux and heartburn which is occurring less now due to dietary changes, but she has also been taking her daughter's prescription of pantoprazole  and that has improved her symptoms.  Denies dysphagia.  Reports she is having normal bowel movements and denies any constipation, diarrhea, blood in her stool, or melena.  She  was seen in the ED last month for complaint of shortness of breath, which she states was due to inadequate nutrition.  She was previously a vegetarian and was not eating enough.  States that she feels better and has more energy now.                  Denies heart or lung problems.  I recent ED visit patient had atypical features of ACS pain, nonischemic and unchanged EKG, and negative troponins.  D-dimer was negative.  Chest x-ray negative.  Lungs clear.  No recurrence of symptoms that brought her into the ED.  Prior to cholecystectomy she has lost some weight, but has gained about 4 pounds in the last month.  States her appetite is normal.  Has had some suprapubic pain intermittently, states she saw gynecologist and has diagnosis of uterine fibroids.  Continuing to follow-up with them.  Previous GI Procedures/Imaging   RUQ US  07/25/2023 *Multiple gallbladder polyps with largest measuring up to 4.6 mm. No routine follow-up is recommended.  Colonoscopy September 11, 2022 - One 4 mm polyp in the transverse colon, removed with a cold snare. Resected and retrieved.  - Diverticulosis in the sigmoid colon and in the descending colon.  - Non- bleeding external and internal hemorrhoids.  - Anal papilla( e) were hypertrophied. - Recall 7 years   EGD November 07, 2017 - Z- line regular, 35 cm from the incisors.  - Normal esophagus.  - Gastritis with hemorrhage. Biopsied.  - Normal examined duodenum.   Past Medical History:  Diagnosis Date  Allergy    Phreesia 03/07/2020   Anxiety    Arthritis    Fibroadenoma    Gallstones    GERD (gastroesophageal reflux disease)    Pre-diabetes     Past Surgical History:  Procedure Laterality Date   CHOLECYSTECTOMY N/A 08/08/2023   Procedure: LAPAROSCOPIC CHOLECYSTECTOMY;  Surgeon: Signe Mitzie LABOR, MD;  Location: [facility];  Service: General;  Laterality: N/A;  ICG   COLONOSCOPY      Current Outpatient Medications  Medication Sig Dispense Refill    ACCU-CHEK GUIDE TEST test strip daily.     metFORMIN  (GLUCOPHAGE -XR) 500 MG 24 hr tablet TAKE 1 TABLET(500 MG) BY MOUTH DAILY WITH BREAKFAST 90 tablet 0   pantoprazole  (PROTONIX ) 40 MG tablet Take 40 mg by mouth 2 (two) times daily.     citalopram  (CELEXA ) 10 MG tablet Take 1 tablet (10 mg total) by mouth daily. (Patient not taking: Reported on 10/16/2023) 90 tablet 3   Continuous Glucose Sensor (FREESTYLE LIBRE 3 SENSOR) MISC Apply to upper arm to monitor blood glucose.  Replace every 14 days. (Patient not taking: Reported on 10/16/2023) 2 each 2   methocarbamol  (ROBAXIN ) 500 MG tablet Take by mouth. (Patient not taking: Reported on 10/16/2023)     No current facility-administered medications for this visit.    Allergies as of 10/16/2023   (No Known Allergies)    Family History  Problem Relation Age of Onset   Hypertension Mother    Stroke Mother 19       mild CVA/TIA   Liver cancer Maternal Grandfather    Colon cancer Neg Hx    Colon polyps Neg Hx    Esophageal cancer Neg Hx    Rectal cancer Neg Hx    Stomach cancer Neg Hx     Social History   Tobacco Use   Smoking status: Never   Smokeless tobacco: Never  Vaping Use   Vaping status: Never Used  Substance Use Topics   Alcohol use: No   Drug use: No     Review of Systems:    Constitutional: No fever, chills, weakness  Cardiovascular: No chest pain Respiratory: No SOB  Gastrointestinal: See HPI and otherwise negative Hematologic: No bleeding or bruising    Physical Exam:  Vital signs: BP 110/70   Pulse 60   Ht 5' 4 (1.626 m)   Wt 120 lb 9.6 oz (54.7 kg)   BMI 20.70 kg/m   Wt Readings from Last 3 Encounters:  10/16/23 120 lb 9.6 oz (54.7 kg)  09/12/23 118 lb (53.5 kg)  09/06/23 116 lb (52.6 kg)   Constitutional: Pleasant, well-appearing female in NAD, alert and cooperative Head:  Normocephalic and atraumatic.  Eyes: No scleral icterus.  Respiratory: Respirations even and unlabored. Lungs clear to  auscultation bilaterally.  No wheezes, crackles, or rhonchi.  Cardiovascular:  Regular rate and rhythm. No murmurs. No peripheral edema. Gastrointestinal:  Soft, nondistended, nontender. No rebound or guarding.  Well-healed surgical scars.  Normal bowel sounds. No appreciable masses or hepatomegaly. Rectal:  Not performed.  Neurologic:  Alert and oriented x4;  grossly normal neurologically.  Skin:   Dry and intact without significant lesions or rashes. Psychiatric: Oriented to person, place and time. Demonstrates good judgement and reason without abnormal affect or behaviors.   RELEVANT LABS AND IMAGING: CBC    Component Value Date/Time   WBC 4.2 09/06/2023 1151   RBC 4.46 09/06/2023 1151   HGB 13.6 09/06/2023 1151   HGB 13.6 05/25/2023 0929  HCT 41.9 09/06/2023 1151   HCT 40.6 05/25/2023 0929   PLT 250 09/06/2023 1151   PLT 267 05/25/2023 0929   MCV 93.9 09/06/2023 1151   MCV 93 05/25/2023 0929   MCH 30.5 09/06/2023 1151   MCHC 32.5 09/06/2023 1151   RDW 11.8 09/06/2023 1151   RDW 11.8 05/25/2023 0929   LYMPHSABS 1.1 05/25/2023 0929   MONOABS 273 07/04/2015 0916   EOSABS 0.1 05/25/2023 0929   BASOSABS 0.0 05/25/2023 0929    CMP     Component Value Date/Time   NA 143 09/06/2023 1151   NA 140 05/25/2023 0929   K 4.0 09/06/2023 1151   CL 104 09/06/2023 1151   CO2 27 09/06/2023 1151   GLUCOSE 115 (H) 09/06/2023 1151   BUN 11 09/06/2023 1151   BUN 8 05/25/2023 0929   CREATININE 0.55 09/06/2023 1151   CREATININE 0.54 07/04/2015 0916   CALCIUM 10.0 09/06/2023 1151   PROT 7.9 09/06/2023 1151   PROT 7.5 05/25/2023 0929   ALBUMIN 4.9 09/06/2023 1151   ALBUMIN 4.7 05/25/2023 0929   AST 35 09/06/2023 1151   ALT 49 (H) 09/06/2023 1151   ALKPHOS 92 09/06/2023 1151   BILITOT 0.4 09/06/2023 1151   BILITOT 0.5 05/25/2023 0929   GFRNONAA >60 09/06/2023 1151   GFRAA 124 03/10/2020 1119     Assessment/Plan:   GERD RUQ abdominal pain Patient is seen today for complaint  of RUQ abdominal pain which has improved following cholecystectomy but not resolved.  Pain occurs daily, sometimes wakes her up at night, and is worse in the evening.  Localized to RUQ with radiation occasionally to her back.  Worse when she sits in a squatted position on the floor and better if she stands up.  Has been having some intermittent heartburn/acid reflux as well which has improved with dietary changes and starting pantoprazole , has been taking her daughter's prescription. There was a finding of a moderately dilated stomach without overtly palpable mass on recent laparoscopic cholecystectomy. Patient has history of gastritis, does not appear to have been on a PPI.  Recommend EGD for further evaluation of these symptoms.  - Schedule EGD. I thoroughly discussed the procedure with the patient to include nature of the procedure, alternatives, benefits, and risks (including but not limited to bleeding, infection, perforation, anesthesia/cardiac/pulmonary complications). Patient verbalized understanding and gave verbal consent to proceed with procedure.  - Start pantoprazole  40 mg BID - Labs today: CBC, CMP, lipase   Camie Furbish, PA-C [facility] 10/16/2023, 9:31 AM  Patient Care Team: Chandra Toribio POUR, MD as PCP - General (Family Medicine) Glennon Almarie POUR, MD as Consulting Physician (Obstetrics and Gynecology)

## 2023-10-16 ENCOUNTER — Encounter: Payer: Self-pay | Admitting: Gastroenterology

## 2023-10-16 ENCOUNTER — Ambulatory Visit (INDEPENDENT_AMBULATORY_CARE_PROVIDER_SITE_OTHER): Admitting: Gastroenterology

## 2023-10-16 ENCOUNTER — Other Ambulatory Visit

## 2023-10-16 ENCOUNTER — Ambulatory Visit: Payer: Self-pay | Admitting: Gastroenterology

## 2023-10-16 VITALS — BP 110/70 | HR 60 | Ht 64.0 in | Wt 120.6 lb

## 2023-10-16 DIAGNOSIS — K219 Gastro-esophageal reflux disease without esophagitis: Secondary | ICD-10-CM

## 2023-10-16 DIAGNOSIS — R1011 Right upper quadrant pain: Secondary | ICD-10-CM

## 2023-10-16 DIAGNOSIS — Z8719 Personal history of other diseases of the digestive system: Secondary | ICD-10-CM

## 2023-10-16 LAB — CBC WITH DIFFERENTIAL/PLATELET
Basophils Absolute: 0 K/uL (ref 0.0–0.1)
Basophils Relative: 0.5 % (ref 0.0–3.0)
Eosinophils Absolute: 0.2 K/uL (ref 0.0–0.7)
Eosinophils Relative: 4.8 % (ref 0.0–5.0)
HCT: 39.9 % (ref 36.0–46.0)
Hemoglobin: 13.3 g/dL (ref 12.0–15.0)
Lymphocytes Relative: 38.8 % (ref 12.0–46.0)
Lymphs Abs: 1.6 K/uL (ref 0.7–4.0)
MCHC: 33.3 g/dL (ref 30.0–36.0)
MCV: 92.1 fl (ref 78.0–100.0)
Monocytes Absolute: 0.3 K/uL (ref 0.1–1.0)
Monocytes Relative: 7.2 % (ref 3.0–12.0)
Neutro Abs: 2 K/uL (ref 1.4–7.7)
Neutrophils Relative %: 48.7 % (ref 43.0–77.0)
Platelets: 251 K/uL (ref 150.0–400.0)
RBC: 4.33 Mil/uL (ref 3.87–5.11)
RDW: 12.7 % (ref 11.5–15.5)
WBC: 4.2 K/uL (ref 4.0–10.5)

## 2023-10-16 LAB — COMPREHENSIVE METABOLIC PANEL WITH GFR
ALT: 46 U/L — ABNORMAL HIGH (ref 0–35)
AST: 33 U/L (ref 0–37)
Albumin: 4.6 g/dL (ref 3.5–5.2)
Alkaline Phosphatase: 76 U/L (ref 39–117)
BUN: 13 mg/dL (ref 6–23)
CO2: 31 meq/L (ref 19–32)
Calcium: 10.1 mg/dL (ref 8.4–10.5)
Chloride: 101 meq/L (ref 96–112)
Creatinine, Ser: 0.6 mg/dL (ref 0.40–1.20)
GFR: 103.25 mL/min (ref >60.00)
Glucose, Bld: 111 mg/dL — ABNORMAL HIGH (ref 70–99)
Potassium: 4 meq/L (ref 3.5–5.1)
Sodium: 140 meq/L (ref 135–145)
Total Bilirubin: 0.6 mg/dL (ref 0.2–1.2)
Total Protein: 8 g/dL (ref 6.0–8.3)

## 2023-10-16 LAB — LIPASE: Lipase: 20 U/L (ref 11.0–59.0)

## 2023-10-16 MED ORDER — PANTOPRAZOLE SODIUM 40 MG PO TBEC: 40.0000 mg | DELAYED_RELEASE_TABLET | Freq: Two times a day (BID) | ORAL | 2 refills | Status: AC

## 2023-10-16 NOTE — Patient Instructions (Signed)
 You have been scheduled for an endoscopy with Dr. Shila. Please follow written instructions given to you at your visit today.  If you use inhalers (even only as needed), please bring them with you on the day of your procedure.  If you take any of the following medications, they will need to be adjusted prior to your procedure:   DO NOT TAKE 7 DAYS PRIOR TO TEST- Trulicity (dulaglutide) Ozempic, Wegovy (semaglutide) Mounjaro (tirzepatide) Bydureon Bcise (exanatide extended release)  DO NOT TAKE 1 DAY PRIOR TO YOUR TEST Rybelsus (semaglutide) Adlyxin (lixisenatide) Victoza (liraglutide) Byetta (exanatide) ___________________________________________________________________________   Please go to the lab in the basement of our building to have lab work done as you leave today. Hit B for basement when you get on the elevator.  When the doors open the lab is on your left.  We will call you with the results. Thank you.  We have sent the following medications to your pharmacy for you to pick up at your convenience: Pantoprazole  40 mg: Take twice daily  You have been scheduled for a follow up appointment on 11-29-23 at 9:20 am. Please arrive 10 minutes early for registration. If you need to reschedule or cancel this appointment please call [phone] as soon as possible. Thank you.   Thank you for entrusting me with your care and for choosing [facility], [place]ie [place], [place]    _______________________________________________________  If your blood pressure at your visit was 140/90 or greater, please contact your primary care physician to follow up on this.  _______________________________________________________  If you are age 52 or older, your body mass index should be between 23-30. Your Body mass index is 20.7 kg/m. If this is out of the aforementioned range listed, please consider follow up with your Primary Care Provider.  If you are age 52 or younger, your body mass  index should be between 19-25. Your Body mass index is 20.7 kg/m. If this is out of the aformentioned range listed, please consider follow up with your Primary Care Provider.   ________________________________________________________  The [facility] providers would like to encourage you to use MYCHART to communicate with providers for non-urgent requests or questions.  Due to long hold times on the telephone, sending your provider a message by [facility] may be a faster and more efficient way to get a response.  Please allow 48 business hours for a response.  Please remember that this is for non-urgent requests.  _______________________________________________________  [facility] is using a team-based approach to care.  Your team is made up of your doctor and two to three APPS. Our APPS (Nurse Practitioners and Physician Assistants) work with your physician to ensure care continuity for you. They are fully qualified to address your health concerns and develop a treatment plan. They communicate directly with your gastroenterologist to care for you. Seeing the Advanced Practice Practitioners on your physician's team can help you by facilitating care more promptly, often allowing for earlier appointments, access to diagnostic testing, procedures, and other specialty referrals.

## 2023-10-17 ENCOUNTER — Other Ambulatory Visit: Payer: Self-pay | Admitting: Family Medicine

## 2023-10-17 ENCOUNTER — Other Ambulatory Visit

## 2023-10-17 DIAGNOSIS — R7303 Prediabetes: Secondary | ICD-10-CM

## 2023-10-17 DIAGNOSIS — Z1231 Encounter for screening mammogram for malignant neoplasm of breast: Secondary | ICD-10-CM

## 2023-10-17 DIAGNOSIS — E7800 Pure hypercholesterolemia, unspecified: Secondary | ICD-10-CM

## 2023-10-17 NOTE — Addendum Note (Signed)
 Addended by: ZIMMERMAN RUMPLE, Vernadine Coombs D on: 10/17/2023 08:05 AM   Modules accepted: Orders

## 2023-10-18 ENCOUNTER — Other Ambulatory Visit: Payer: Self-pay | Admitting: Family Medicine

## 2023-10-18 LAB — LIPID PANEL
Cholesterol: 214 mg/dL — ABNORMAL HIGH (ref <200)
HDL: 78 mg/dL (ref >=50)
LDL Cholesterol (Calc): 114 mg/dL — ABNORMAL HIGH
Non-HDL Cholesterol (Calc): 136 mg/dL — ABNORMAL HIGH (ref <130)
Total CHOL/HDL Ratio: 2.7 (calc) (ref <5.0)
Triglycerides: 113 mg/dL (ref <150)

## 2023-10-18 LAB — HEMOGLOBIN A1C
Hgb A1c MFr Bld: 6.3 % — ABNORMAL HIGH (ref <5.7)
Mean Plasma Glucose: 134 mg/dL
eAG (mmol/L): 7.4 mmol/L

## 2023-10-22 ENCOUNTER — Other Ambulatory Visit: Payer: Self-pay | Admitting: Family Medicine

## 2023-10-22 ENCOUNTER — Ambulatory Visit: Payer: Self-pay | Admitting: Family Medicine

## 2023-10-22 MED ORDER — METFORMIN HCL ER 500 MG PO TB24: 500.0000 mg | ORAL_TABLET | Freq: Two times a day (BID) | ORAL | 1 refills | Status: AC

## 2023-10-23 ENCOUNTER — Ambulatory Visit (AMBULATORY_SURGERY_CENTER): Admitting: Gastroenterology

## 2023-10-23 ENCOUNTER — Encounter: Payer: Self-pay | Admitting: Gastroenterology

## 2023-10-23 VITALS — BP 102/70 | HR 69 | Temp 98.0°F | Resp 12 | Ht 64.0 in | Wt 120.0 lb

## 2023-10-23 DIAGNOSIS — R1011 Right upper quadrant pain: Secondary | ICD-10-CM

## 2023-10-23 DIAGNOSIS — K219 Gastro-esophageal reflux disease without esophagitis: Secondary | ICD-10-CM | POA: Diagnosis present

## 2023-10-23 MED ORDER — SODIUM CHLORIDE 0.9 % IV SOLN: 500.0000 mL | Freq: Once | INTRAVENOUS | Status: DC

## 2023-10-23 MED ORDER — DICYCLOMINE HCL 10 MG PO CAPS: 10.0000 mg | ORAL_CAPSULE | Freq: Two times a day (BID) | ORAL | 0 refills | Status: AC | PRN

## 2023-10-23 NOTE — Progress Notes (Unsigned)
 [facility] History and Physical   Primary Care Physician:  Chandra Toribio POUR, MD   Reason for Procedure:  Epigastric abd pain, GERD  Plan:    EGD with possible interventions as needed     HPI: Cristina Boyd is a very pleasant 53 y.o. female here for EGD for epigastric abd pain and GERD.   The risks and benefits as well as alternatives of endoscopic procedure(s) have been discussed and reviewed. All questions answered. The patient agrees to proceed.    Past Medical History:  Diagnosis Date   Allergy    Phreesia 03/07/2020   Anxiety    Arthritis    Fibroadenoma    Gallstones    GERD (gastroesophageal reflux disease)    Pre-diabetes     Past Surgical History:  Procedure Laterality Date   CHOLECYSTECTOMY N/A 08/08/2023   Procedure: LAPAROSCOPIC CHOLECYSTECTOMY;  Surgeon: Signe Mitzie LABOR, MD;  Location: [facility];  Service: General;  Laterality: N/A;  ICG   COLONOSCOPY      Prior to Admission medications   Medication Sig Start Date End Date Taking? Authorizing Provider  citalopram  (CELEXA ) 10 MG tablet Take 1 tablet (10 mg total) by mouth daily. 07/12/23  Yes Chandra Toribio POUR, MD  metFORMIN  (GLUCOPHAGE -XR) 500 MG 24 hr tablet Take 1 tablet (500 mg total) by mouth 2 (two) times daily with a meal. 10/22/23  Yes Chandra Toribio POUR, MD  pantoprazole  (PROTONIX ) 40 MG tablet Take 1 tablet (40 mg total) by mouth 2 (two) times daily. 10/16/23  Yes Heinz, Camie BRAVO, PA-C  ACCU-CHEK GUIDE TEST test strip daily. 08/27/23   [provider]  Continuous Glucose Sensor (FREESTYLE LIBRE 3 SENSOR) MISC Apply to upper arm to monitor blood glucose.  Replace every 14 days. Patient not taking: No sig reported 09/03/23   Chandra Toribio POUR, MD  methocarbamol  (ROBAXIN ) 500 MG tablet Take by mouth. Patient not taking: No sig reported 08/29/23   [provider]    Current Outpatient Medications  Medication Sig Dispense Refill   citalopram  (CELEXA ) 10 MG tablet Take 1 tablet (10 mg total)  by mouth daily. 90 tablet 3   metFORMIN  (GLUCOPHAGE -XR) 500 MG 24 hr tablet Take 1 tablet (500 mg total) by mouth 2 (two) times daily with a meal. 180 tablet 1   pantoprazole  (PROTONIX ) 40 MG tablet Take 1 tablet (40 mg total) by mouth 2 (two) times daily. 60 tablet 2   ACCU-CHEK GUIDE TEST test strip daily.     Continuous Glucose Sensor (FREESTYLE LIBRE 3 SENSOR) MISC Apply to upper arm to monitor blood glucose.  Replace every 14 days. (Patient not taking: No sig reported) 2 each 2   methocarbamol  (ROBAXIN ) 500 MG tablet Take by mouth. (Patient not taking: No sig reported)     Current Facility-Administered Medications  Medication Dose Route Frequency Provider Last Rate Last Admin   0.9 %  sodium chloride  infusion  500 mL Intravenous Once Caige Almeda V, MD        Allergies as of 10/23/2023   (No Known Allergies)    Family History  Problem Relation Age of Onset   Hypertension Mother    Stroke Mother 53       mild CVA/TIA   Liver cancer Maternal Grandfather    Colon cancer Neg Hx    Colon polyps Neg Hx    Esophageal cancer Neg Hx    Rectal cancer Neg Hx    Stomach cancer Neg Hx     Social History  Socioeconomic History   Marital status: Married    Spouse name: Not on file   Number of children: Not on file   Years of education: Not on file   Highest education level: Not on file  Occupational History   Not on file  Tobacco Use   Smoking status: Never   Smokeless tobacco: Never  Vaping Use   Vaping status: Never Used  Substance and Sexual Activity   Alcohol use: No   Drug use: No   Sexual activity: Yes    Partners: Male    Birth control/protection: Condom    Comment: 1st intercourse 4 yo,1 partner  Other Topics Concern   Not on file  Social History Narrative   Marital status: married x 25 years   From [place]      Children: 4 children (23, 22, 20, 17); no grandchildren      Lives: with husband, 2 children; 2 in college      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  Physical Exam: Vital signs in last 24 hours: BP 133/76   Pulse 64   Temp 98 F (36.7 C) (Temporal)   Ht 5' 4 (1.626 m)   Wt 120 lb (54.4 kg)   LMP 06/06/2022 (Exact Date)   SpO2 100%   BMI 20.60 kg/m  General:   Alert, NAD Lungs:  Clear .   Heart:  Regular rate and rhythm Abdomen:  Soft, nontender and nondistended. Neuro/Psych:  Alert and cooperative. Normal mood and affect. A and O x 3  Reviewed labs, radiology imaging, old records and pertinent past GI work up  Patient is appropriate for planned procedure(s) and anesthesia in an ambulatory setting   K. Veena Jmya Uliano , MD [phone]

## 2023-10-23 NOTE — Progress Notes (Unsigned)
 Sedate, gd SR, tolerated procedure well, VSS, report to RN

## 2023-10-23 NOTE — Op Note (Signed)
 [facility] Patient Name: Cristina Boyd Procedure Date: 10/23/2023 2:23 PM MRN: [id number] Endoscopist: Gustav ALONSO Mcgee , MD, [id number] Age: 53 Referring MD:  Date of Birth: May 14, 1970 Gender: Female Account #: [id number] Procedure:                Upper GI endoscopy Indications:              Epigastric abdominal pain, Suspected                            gastro-esophageal reflux disease Medicines:                Monitored Anesthesia Care Procedure:                Pre-Anesthesia Assessment:                           - Prior to the procedure, a History and Physical                            was performed, and patient medications and                            allergies were reviewed. The patient's tolerance of                            previous anesthesia was also reviewed. The risks                            and benefits of the procedure and the sedation                            options and risks were discussed with the patient.                            All questions were answered, and informed consent                            was obtained. Prior Anticoagulants: The patient has                            taken no anticoagulant or antiplatelet agents. ASA                            Grade Assessment: II - A patient with mild systemic                            disease. After reviewing the risks and benefits,                            the patient was deemed in satisfactory condition to                            undergo the procedure.  After obtaining informed consent, the endoscope was                            passed under direct vision. Throughout the                            procedure, the patient's blood pressure, pulse, and                            oxygen saturations were monitored continuously. The                            GIF [id number] #[id number] was introduced through the                            mouth, and advanced to the second  part of duodenum.                            The upper GI endoscopy was accomplished without                            difficulty. The patient tolerated the procedure                            well. Scope In: Scope Out: Findings:                 The Z-line was regular and was found 36 cm from the                            incisors.                           The examined esophagus was normal.                           The stomach was normal.                           The cardia and gastric fundus were normal on                            retroflexion.                           The examined duodenum was normal. Complications:            No immediate complications. Estimated Blood Loss:     Estimated blood loss was minimal. Impression:               - Z-line regular, 36 cm from the incisors.                           - Normal esophagus.                           - Normal stomach.                           -  Normal examined duodenum.                           - No specimens collected. Recommendation:           - Patient has a contact number available for                            emergencies. The signs and symptoms of potential                            delayed complications were discussed with the                            patient. Return to normal activities tomorrow.                            Written discharge instructions were provided to the                            patient.                           - Resume previous diet.                           - Continue present medications.                           - Follow an antireflux regimen.                           - Dicyclomine  10mg  q12h PRN for abdominal                            discomfort and spasms                           - Follow up in GI office with APP in 2-3 months Gustav ALONSO Mcgee, MD 10/23/2023 3:02:12 PM This report has been signed electronically.

## 2023-10-23 NOTE — Progress Notes (Unsigned)
 Interpreter used today at the [facility] for this pt.  Interpreter's name is-Thai  Pt's states no medical or surgical changes since previsit or office visit.

## 2023-10-23 NOTE — Patient Instructions (Addendum)
-  Return to GI office  -Dicyclomine  10 mg every 12 hours for abdominal discomfort as needed  YOU HAD AN ENDOSCOPIC PROCEDURE TODAY AT THE [facility]:   Refer to the procedure report that was given to you for any specific questions about what was found during the examination.  If the procedure report does not answer your questions, please call your gastroenterologist to clarify.  If you requested that your care partner not be given the details of your procedure findings, then the procedure report has been included in a sealed envelope for you to review at your convenience later.  YOU SHOULD EXPECT: Some feelings of bloating in the abdomen. Passage of more gas than usual.  Walking can help get rid of the air that was put into your GI tract during the procedure and reduce the bloating. If you had a lower endoscopy (such as a colonoscopy or flexible sigmoidoscopy) you may notice spotting of blood in your stool or on the toilet paper. If you underwent a bowel prep for your procedure, you may not have a normal bowel movement for a few days.  Please Note:  You might notice some irritation and congestion in your nose or some drainage.  This is from the oxygen used during your procedure.  There is no need for concern and it should clear up in a day or so.  SYMPTOMS TO REPORT IMMEDIATELY:  Following upper endoscopy (EGD)  Vomiting of blood or coffee ground material  New chest pain or pain under the shoulder blades  Painful or persistently difficult swallowing  New shortness of breath  Fever of 100F or higher  Black, tarry-looking stools  For urgent or emergent issues, a gastroenterologist can be reached at any hour by calling [phone]. Do not use MyChart messaging for urgent concerns.    DIET:  We do recommend a small meal at first, but then you may proceed to your regular diet.  Drink plenty of fluids but you should avoid alcoholic beverages for 24 hours.  ACTIVITY:  You should  plan to take it easy for the rest of today and you should NOT DRIVE or use heavy machinery until tomorrow (because of the sedation medicines used during the test).    FOLLOW UP: Our staff will call the number listed on your records the next business day following your procedure.  We will call around 7:15- 8:00 am to check on you and address any questions or concerns that you may have regarding the information given to you following your procedure. If we do not reach you, we will leave a message.     If any biopsies were taken you will be contacted by phone or by letter within the next 1-3 weeks.  Please call us  at [phone] if you have not heard about the biopsies in 3 weeks.    SIGNATURES/CONFIDENTIALITY: You and/or your care partner have signed paperwork which will be entered into your electronic medical record.  These signatures attest to the fact that that the information above on your After Visit Summary has been reviewed and is understood.  Full responsibility of the confidentiality of this discharge information lies with you and/or your care-partner.

## 2023-10-24 ENCOUNTER — Telehealth: Payer: Self-pay | Admitting: Lactation Services

## 2023-10-24 NOTE — Telephone Encounter (Signed)
  Follow up Call-     10/23/2023    1:33 PM 09/11/2022    3:21 PM  Call back number  Post procedure Call Back phone  # Alvia- [phone] [place] [phone]  Permission to leave phone message Yes Yes     Patient questions:  Do you have a fever, pain , or abdominal swelling? No. Pain Score  0 *  Have you tolerated food without any problems? Yes.    Have you been able to return to your normal activities? Yes.    Do you have any questions about your discharge instructions: Diet   No. Medications  No. Follow up visit  No.  Do you have questions or concerns about your Care? No.  Actions: * If pain score is 4 or above: No action needed, pain <4.

## 2023-10-31 ENCOUNTER — Ambulatory Visit
Admission: RE | Admit: 2023-10-31 | Discharge: 2023-10-31 | Disposition: A | Discharge status: Home / Self Care | Source: Ambulatory Visit | Attending: Family Medicine

## 2023-10-31 DIAGNOSIS — Z1231 Encounter for screening mammogram for malignant neoplasm of breast: Secondary | ICD-10-CM

## 2023-11-03 ENCOUNTER — Ambulatory Visit: Payer: Self-pay | Admitting: Family Medicine

## 2023-11-29 ENCOUNTER — Ambulatory Visit: Admitting: Gastroenterology

## 2023-12-06 ENCOUNTER — Ambulatory Visit: Payer: Self-pay | Admitting: Gastroenterology

## 2023-12-06 ENCOUNTER — Ambulatory Visit (INDEPENDENT_AMBULATORY_CARE_PROVIDER_SITE_OTHER): Admitting: Gastroenterology

## 2023-12-06 ENCOUNTER — Encounter: Payer: Self-pay | Admitting: Gastroenterology

## 2023-12-06 ENCOUNTER — Other Ambulatory Visit

## 2023-12-06 VITALS — BP 110/70 | HR 66 | Ht 64.0 in | Wt 123.0 lb

## 2023-12-06 DIAGNOSIS — R1011 Right upper quadrant pain: Secondary | ICD-10-CM

## 2023-12-06 DIAGNOSIS — R748 Abnormal levels of other serum enzymes: Secondary | ICD-10-CM

## 2023-12-06 LAB — HEPATIC FUNCTION PANEL
ALT: 26 U/L (ref 0–35)
AST: 22 U/L (ref 0–37)
Albumin: 4.6 g/dL (ref 3.5–5.2)
Alkaline Phosphatase: 83 U/L (ref 39–117)
Bilirubin, Direct: 0.1 mg/dL (ref 0.0–0.3)
Total Bilirubin: 0.4 mg/dL (ref 0.2–1.2)
Total Protein: 7.9 g/dL (ref 6.0–8.3)

## 2023-12-06 NOTE — Progress Notes (Signed)
 Cristina Boyd [id number] 1970-03-13   Chief Complaint: Abdominal pain  Referring Provider: Chandra Toribio POUR, MD Primary GI MD: Dr. Shila  HPI: Cristina Boyd is a 53 y.o. female with past medical history of GERD, RUQ abdominal pain s/p cholecystectomy who presents today for follow up.  Seen in office 01/2023 by Dr. Nandigam at which time her GERD symptoms had improved with occasional late night exacerbations.  History of gastritis on endoscopy in 2019, no H. pylori.  Discussed the option of repeat endoscopy if symptoms persisted and she was to continue daily omeprazole  and famotidine  in the evening.   Seen by PCP 09/12/2023.  Noted to have had cholecystectomy the month prior for RUQ pain and gallbladder polyps.  Polyps benign on pathology.  Pain overall better but still has some RUQ pain.   Per postop diagnosis/IntraOp findings from [facility] surgery on laparoscopic cholecystectomy: multiple soft, white deposits on the RUQ abdominal wall as well as on the antrum of the stomach.  Moderately dilated stomach without overtly palpable mass.  At last visit 10/16/2023 patient reported RUQ pain which had improved since cholecystectomy but not completely resolved.  Had pain occurring daily and sometimes waking her up at night, worse in the evening.  Localized to RUQ with radiation to her back occasionally.  Worse in a squatted position on the floor and better with standing.  Endorsed intermittent heartburn and acid reflux which had improved with dietary changes and starting pantoprazole .   Noted to have mild elevation in ALT to 49 on labs in July, and on repeat showed slight improvement with ALT 46.  Normal CBC and lipase.  Liver appeared normal on ultrasound in May.  On labs with PCP found to have elevated LDL and hemoglobin A1c 6.3.  She underwent EGD 10/23/2023 with no abnormal findings and was advised to continue antireflux regimen and start dicyclomine  10 mg every 12 hours as needed for abdominal  discomfort and spasms.   Patient seen today for follow-up.  States her abdominal pain is a little bit better, reflux has also improved.  Bentyl  may help some with her pain but does not fully resolve pain.  At worst pain is 7/8 out of 10.  Worse in the evening and at night, though today she had mild pain in the morning.  Had previously been feeling fatigued and having some neck pain and insomnia, as well as depression.  States she was advised to start a women's multivitamin, calcium, magnesium, vitamin D , omega-3, zinc, vitamin C.  Since starting supplements feels much better.  Denies any family history of liver disease.  Has not been taking PPI, does not feel that she needs it.  Having very minimal reflux.  Right sided pain has been ongoing for about 5 years now.  States it does improve if she applies heat or massages the area.  Previous GI Procedures/Imaging   EGD 10/23/2023 - Z- line regular, 36 cm from the incisors.  - Normal esophagus.  - Normal stomach.  - Normal examined duodenum.  - No specimens collected.  RUQ US  07/25/2023 *Multiple gallbladder polyps with largest measuring up to 4.6 mm. No routine follow-up is recommended.   Colonoscopy September 11, 2022 - One 4 mm polyp in the transverse colon, removed with a cold snare. Resected and retrieved.  - Diverticulosis in the sigmoid colon and in the descending colon.  - Non- bleeding external and internal hemorrhoids.  - Anal papilla( e) were hypertrophied. - Recall 7 years   EGD November 07, 2017 - Z- line regular, 35 cm from the incisors.  - Normal esophagus.  - Gastritis with hemorrhage. Biopsied.  - Normal examined duodenum.   Past Medical History:  Diagnosis Date   Allergy    [employer]reesia 03/07/2020   Anxiety    Arthritis    Fibroadenoma    Gallstones    GERD (gastroesophageal reflux disease)    Pre-diabetes     Past Surgical History:  Procedure Laterality Date   CHOLECYSTECTOMY N/A 08/08/2023   Procedure:  LAPAROSCOPIC CHOLECYSTECTOMY;  Surgeon: Signe Mitzie LABOR, MD;  Location: [facility];  Service: General;  Laterality: N/A;  ICG   COLONOSCOPY      Current Outpatient Medications  Medication Sig Dispense Refill   ACCU-CHEK GUIDE TEST test strip daily.     citalopram  (CELEXA ) 10 MG tablet Take 1 tablet (10 mg total) by mouth daily. 90 tablet 3   Continuous Glucose Sensor (FREESTYLE LIBRE 3 SENSOR) MISC Apply to upper arm to monitor blood glucose.  Replace every 14 days. (Patient not taking: No sig reported) 2 each 2   dicyclomine  (BENTYL ) 10 MG capsule Take 1 capsule (10 mg total) by mouth every 12 (twelve) hours as needed for spasms. 90 capsule 0   metFORMIN  (GLUCOPHAGE -XR) 500 MG 24 hr tablet Take 1 tablet (500 mg total) by mouth 2 (two) times daily with a meal. 180 tablet 1   methocarbamol  (ROBAXIN ) 500 MG tablet Take by mouth. (Patient not taking: No sig reported)     pantoprazole  (PROTONIX ) 40 MG tablet Take 1 tablet (40 mg total) by mouth 2 (two) times daily. 60 tablet 2   No current facility-administered medications for this visit.    Allergies as of 12/06/2023   (No Known Allergies)    Family History  Problem Relation Age of Onset   Hypertension Mother    Stroke Mother 45       mild CVA/TIA   Liver cancer Maternal Grandfather    Colon cancer Neg Hx    Colon polyps Neg Hx    Esophageal cancer Neg Hx    Rectal cancer Neg Hx    Stomach cancer Neg Hx     Social History   Tobacco Use   Smoking status: Never   Smokeless tobacco: Never  Vaping Use   Vaping status: Never Used  Substance Use Topics   Alcohol use: No   Drug use: No     Review of Systems:    Constitutional: No fever, chills Cardiovascular: No chest pain, chest pressure or palpitations   Respiratory: No SOB or cough Gastrointestinal: See HPI and otherwise negative Hematologic: No bleeding or bruising   Physical Exam:  Vital signs: BP 110/70   Pulse 66   Ht 5' 4 (1.626 m)   Wt 123 lb (55.8 kg)   LMP  06/06/2022 (Exact Date)   BMI 21.11 kg/m   Constitutional: Pleasant, well-appearing female in NAD, alert and cooperative Head:  Normocephalic and atraumatic.  Eyes: No scleral icterus.  Respiratory: Respirations even and unlabored. Lungs clear to auscultation bilaterally.  No wheezes, crackles, or rhonchi.  Cardiovascular:  Regular rate and rhythm. No murmurs. No peripheral edema. Gastrointestinal:  Soft, nondistended, mild tenderness to palpation of RUQ. No rebound or guarding. Normal bowel sounds. No appreciable masses or hepatomegaly. Rectal:  Not performed.  Neurologic:  Alert and oriented x4;  grossly normal neurologically.  Skin:   Dry and intact without significant lesions or rashes. Psychiatric: Oriented to person, place and time. Demonstrates good judgement  and reason without abnormal affect or behaviors.  RELEVANT LABS AND IMAGING: CBC    Component Value Date/Time   WBC 4.2 10/16/2023 1020   RBC 4.33 10/16/2023 1020   HGB 13.3 10/16/2023 1020   HGB 13.6 05/25/2023 0929   HCT 39.9 10/16/2023 1020   HCT 40.6 05/25/2023 0929   PLT 251.0 10/16/2023 1020   PLT 267 05/25/2023 0929   MCV 92.1 10/16/2023 1020   MCV 93 05/25/2023 0929   MCH 30.5 09/06/2023 1151   MCHC 33.3 10/16/2023 1020   RDW 12.7 10/16/2023 1020   RDW 11.8 05/25/2023 0929   LYMPHSABS 1.6 10/16/2023 1020   LYMPHSABS 1.1 05/25/2023 0929   MONOABS 0.3 10/16/2023 1020   EOSABS 0.2 10/16/2023 1020   EOSABS 0.1 05/25/2023 0929   BASOSABS 0.0 10/16/2023 1020   BASOSABS 0.0 05/25/2023 0929    CMP     Component Value Date/Time   NA 140 10/16/2023 1020   NA 140 05/25/2023 0929   K 4.0 10/16/2023 1020   CL 101 10/16/2023 1020   CO2 31 10/16/2023 1020   GLUCOSE 111 (H) 10/16/2023 1020   BUN 13 10/16/2023 1020   BUN 8 05/25/2023 0929   CREATININE 0.60 10/16/2023 1020   CREATININE 0.54 07/04/2015 0916   CALCIUM 10.1 10/16/2023 1020   PROT 8.0 10/16/2023 1020   PROT 7.5 05/25/2023 0929   ALBUMIN 4.6  10/16/2023 1020   ALBUMIN 4.7 05/25/2023 0929   AST 33 10/16/2023 1020   ALT 46 (H) 10/16/2023 1020   ALKPHOS 76 10/16/2023 1020   BILITOT 0.6 10/16/2023 1020   BILITOT 0.5 05/25/2023 0929   GFRNONAA >60 09/06/2023 1151   GFRAA 124 03/10/2020 1119     Assessment/Plan:   RUQ abdominal pain Elevated liver enzymes Patient seen today for follow-up of RUQ abdominal pain and reflux.  Underwent EGD 10/23/2023, normal findings.  She has since stopped PPI as she does not feel she needed and is having minimal symptoms.  Does continue to have intermittent RUQ pain which is a 7-8/10 in severity, improves with heat and massage.  Was found to have mild elevation in ALT on recent labs.  Liver appeared normal on ultrasound in May though she did have multiple gallbladder polyps.  Bentyl  minimally helpful with pain.  States pain has been ongoing for the last 5 years.  Possibly musculoskeletal, though with elevation in liver enzymes will repeat a right upper quadrant ultrasound with consideration for HIDA scan if negative.  Further serologic workup if liver enzymes remain elevated.  - Labs: HFP - RUQ US  - Consider follow-up with HIDA scan - If liver enzymes remain elevated pursue further serologic workup - Can continue Bentyl  as needed - Protonix  as needed   Camie Furbish, PA-C [facility] 12/06/2023, 1:49 PM  Patient Care Team: Chandra Toribio POUR, MD as PCP - General (Family Medicine) Glennon Almarie POUR, MD as Consulting Physician (Obstetrics and Gynecology)

## 2023-12-06 NOTE — Patient Instructions (Addendum)
 You have been scheduled for an abdominal ultrasound at [facility] (1st floor of hospital) on 12/12/23 at 8:00 am . Please arrive 30 minutes prior to your appointment for registration. Make certain not to have anything to eat or drink 6 hours prior to your appointment. Should you need to reschedule your appointment, please contact radiology at [phone]. This test typically takes about 30 minutes to perform.   Your provider has requested that you go to the basement level for lab work before leaving today. Press B on the elevator. The lab is located at the first door on the left as you exit the elevator.  Continue Bentyl  as needed.   Take Pantoprazole  as needed.   Due to recent changes in healthcare laws, you may see the results of your imaging and laboratory studies on MyChart before your provider has had a chance to review them.  We understand that in some cases there may be results that are confusing or concerning to you. Not all laboratory results come back in the same time frame and the provider may be waiting for multiple results in order to interpret others.  Please give us  48 hours in order for your provider to thoroughly review all the results before contacting the office for clarification of your results.   _______________________________________________________  If your blood pressure at your visit was 140/90 or greater, please contact your primary care physician to follow up on this.  _______________________________________________________  If you are age 52 or older, your body mass index should be between 23-30. Your Body mass index is 21.11 kg/m. If this is out of the aforementioned range listed, please consider follow up with your Primary Care Provider.  If you are age 43 or younger, your body mass index should be between 19-25. Your Body mass index is 21.11 kg/m. If this is out of the aformentioned range listed, please consider follow up with your Primary Care  Provider.   ________________________________________________________  The [facility] providers would like to encourage you to use MYCHART to communicate with providers for non-urgent requests or questions.  Due to long hold times on the telephone, sending your provider a message by [facility] may be a faster and more efficient way to get a response.  Please allow 48 business hours for a response.  Please remember that this is for non-urgent requests.  _______________________________________________________  [facility] is using a team-based approach to care.  Your team is made up of your doctor and two to three APPS. Our APPS (Nurse Practitioners and Physician Assistants) work with your physician to ensure care continuity for you. They are fully qualified to address your health concerns and develop a treatment plan. They communicate directly with your gastroenterologist to care for you. Seeing the Advanced Practice Practitioners on your physician's team can help you by facilitating care more promptly, often allowing for earlier appointments, access to diagnostic testing, procedures, and other specialty referrals.   Thank you for choosing me and [facility].  Camie Furbish, PA-C

## 2023-12-08 ENCOUNTER — Encounter: Payer: Self-pay | Admitting: Gastroenterology

## 2023-12-12 ENCOUNTER — Ambulatory Visit (HOSPITAL_COMMUNITY)
Admission: RE | Admit: 2023-12-12 | Discharge: 2023-12-12 | Disposition: A | Discharge status: Home / Self Care | Source: Ambulatory Visit | Attending: Gastroenterology | Admitting: Gastroenterology

## 2023-12-12 DIAGNOSIS — R748 Abnormal levels of other serum enzymes: Secondary | ICD-10-CM | POA: Diagnosis present

## 2023-12-12 DIAGNOSIS — R1011 Right upper quadrant pain: Secondary | ICD-10-CM | POA: Insufficient documentation

## 2023-12-14 NOTE — Progress Notes (Signed)
 Spoke with pt via [place] interpreter # [phone]. Relayed results and recommendations. Pt verbalized understanding. Advised pt that Camie would like to schedule a f/u in 6 months. Pt verbalized understanding.

## 2023-12-17 ENCOUNTER — Ambulatory Visit: Admitting: Family Medicine

## 2023-12-21 ENCOUNTER — Ambulatory Visit: Admitting: Gastroenterology

## 2024-04-16 ENCOUNTER — Ambulatory Visit: Admitting: Family Medicine
